# Patient Record
Sex: Male | Born: 1965 | Race: White | Hispanic: No | Marital: Married | Smoking: Never smoker
Health system: Southern US, Community
[De-identification: ages and names within clinical notes are randomized; demographics above are authoritative.]

## PROBLEM LIST (undated history)

## (undated) DIAGNOSIS — J309 Allergic rhinitis, unspecified: Secondary | ICD-10-CM

## (undated) DIAGNOSIS — I499 Cardiac arrhythmia, unspecified: Secondary | ICD-10-CM

## (undated) DIAGNOSIS — E669 Obesity, unspecified: Secondary | ICD-10-CM

## (undated) DIAGNOSIS — I4891 Unspecified atrial fibrillation: Secondary | ICD-10-CM

## (undated) DIAGNOSIS — Z98811 Dental restoration status: Secondary | ICD-10-CM

## (undated) DIAGNOSIS — G039 Meningitis, unspecified: Secondary | ICD-10-CM

## (undated) DIAGNOSIS — J302 Other seasonal allergic rhinitis: Secondary | ICD-10-CM

## (undated) DIAGNOSIS — F419 Anxiety disorder, unspecified: Secondary | ICD-10-CM

## (undated) DIAGNOSIS — G4730 Sleep apnea, unspecified: Secondary | ICD-10-CM

## (undated) DIAGNOSIS — J342 Deviated nasal septum: Secondary | ICD-10-CM

## (undated) DIAGNOSIS — J343 Hypertrophy of nasal turbinates: Secondary | ICD-10-CM

## (undated) DIAGNOSIS — E785 Hyperlipidemia, unspecified: Secondary | ICD-10-CM

## (undated) HISTORY — PX: COLONOSCOPY: SHX174

## (undated) HISTORY — PX: CORONARY ANGIOPLASTY WITH STENT PLACEMENT: SHX49

## (undated) HISTORY — DX: Meningitis, unspecified: G03.9

## (undated) HISTORY — DX: Hyperlipidemia, unspecified: E78.5

---
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
## 2001-01-17 HISTORY — PX: HERNIA REPAIR: SHX51

## 2005-06-26 ENCOUNTER — Ambulatory Visit: Payer: Self-pay | Admitting: Unknown Physician Specialty

## 2009-02-02 ENCOUNTER — Emergency Department (HOSPITAL_COMMUNITY): Admission: EM | Admit: 2009-02-02 | Discharge: 2009-02-02 | Payer: Self-pay | Admitting: Emergency Medicine

## 2010-09-10 LAB — CK: Total CK: 605 U/L — ABNORMAL HIGH (ref 7–232)

## 2010-09-10 LAB — BASIC METABOLIC PANEL
BUN: 17 mg/dL (ref 6–23)
CO2: 29 mEq/L (ref 19–32)
Chloride: 100 mEq/L (ref 96–112)
Glucose, Bld: 132 mg/dL — ABNORMAL HIGH (ref 70–99)
Potassium: 3.5 mEq/L (ref 3.5–5.1)

## 2013-06-05 HISTORY — PX: BACK SURGERY: SHX140

## 2014-03-04 ENCOUNTER — Ambulatory Visit: Payer: Self-pay | Admitting: General Surgery

## 2014-03-04 ENCOUNTER — Encounter: Payer: Self-pay | Admitting: General Surgery

## 2014-03-04 ENCOUNTER — Ambulatory Visit (INDEPENDENT_AMBULATORY_CARE_PROVIDER_SITE_OTHER): Payer: No Typology Code available for payment source | Admitting: General Surgery

## 2014-03-04 VITALS — BP 130/74 | HR 74 | Resp 12 | Ht 73.0 in | Wt 236.0 lb

## 2014-03-04 DIAGNOSIS — K4090 Unilateral inguinal hernia, without obstruction or gangrene, not specified as recurrent: Secondary | ICD-10-CM

## 2014-03-04 NOTE — Patient Instructions (Addendum)

## 2014-03-04 NOTE — Progress Notes (Signed)
Patient ID: Keith Meyer., male   DOB: 11/16/1965, 48 y.o.   MRN: [id number]  Chief Complaint  Patient presents with  . Hernia    HPI Keith Torres. is a 48 y.o. male.  Here today for evaluation of left inguinal hernia. He states he was lifting something heavy (A laminated beam to support the floor joists) at work and he felt it "pull" in the left groin about 3 weeks ago. He states he can push it back in but it does not seem to stay. No change in bowel or bladder function.   HPI  Past Medical History  Diagnosis Date  . Hyperlipidemia   . Meningitis     Past Surgical History  Procedure Laterality Date  . Appendectomy  1974  . Hernia repair Right 01-17-01    Indirect hernia, Prolene hernia system.    Family History  Problem Relation Age of Onset  . Cancer Mother     colon  . Heart disease Father     Social History History  Substance Use Topics  . Smoking status: Never Smoker   . Smokeless tobacco: Never Used  . Alcohol Use: Yes     Comment: occasionally    No Known Allergies  Current Outpatient Prescriptions  Medication Sig Dispense Refill  . naproxen sodium (ANAPROX) 220 MG tablet Take 220 mg by mouth daily.       No current facility-administered medications for this visit.    Review of Systems Review of Systems  Constitutional: Negative.   Respiratory: Negative.   Cardiovascular: Negative.     Blood pressure 130/74, pulse 74, resp. rate 12, height 6\' 1"  (1.854 m), weight 236 lb (107.049 kg).  Physical Exam Physical Exam  Constitutional: He is oriented to person, place, and time. He appears well-developed and well-nourished.  Neck: Neck supple.  Cardiovascular: Normal rate, regular rhythm and normal heart sounds.   Pulmonary/Chest: Effort normal and breath sounds normal.  Abdominal: Soft. Normal appearance. A hernia is present. Hernia confirmed positive in the left inguinal area. Hernia confirmed negative in the right inguinal area.   reducible left inguinal hernia   Genitourinary: Testes normal.  Lymphadenopathy:    He has no cervical adenopathy.  Neurological: He is alert and oriented to person, place, and time.  Skin: Skin is warm and dry.    Data Reviewed None.  Assessment    Symptomatic left inguinal hernia.     Plan    Hernia precautions and incarceration were discussed with the patient. If they develop symptoms of an incarcerated hernia, they were encouraged to seek prompt medical attention.  I have recommended repair of the hernia using mesh on an outpatient basis in the near future. The risk of infection was reviewed. The role of prosthetic mesh to minimize the risk of recurrence was reviewed.  Patient's surgery has been scheduled for 03-23-14 at [facility].  PCP: Keith Torres ([facility])    Robert Bellow 03/06/2014, 8:53 PM

## 2014-03-06 ENCOUNTER — Other Ambulatory Visit: Payer: Self-pay | Admitting: General Surgery

## 2014-03-06 ENCOUNTER — Encounter: Payer: Self-pay | Admitting: General Surgery

## 2014-03-06 DIAGNOSIS — K469 Unspecified abdominal hernia without obstruction or gangrene: Secondary | ICD-10-CM | POA: Insufficient documentation

## 2014-03-06 DIAGNOSIS — K4090 Unilateral inguinal hernia, without obstruction or gangrene, not specified as recurrent: Secondary | ICD-10-CM

## 2014-03-12 ENCOUNTER — Telehealth: Payer: Self-pay | Admitting: *Deleted

## 2014-03-12 NOTE — Telephone Encounter (Signed)
Pt hurt his back yesterday and was wondering can he take a steroid and muscle relaxer this close to his surgery date. He hernia surgery is schedule for 03/23/14

## 2014-03-12 NOTE — Telephone Encounter (Signed)
Yes

## 2014-03-16 ENCOUNTER — Telehealth: Payer: Self-pay

## 2014-03-16 NOTE — Telephone Encounter (Signed)
Patient's wife called and said that the patient has not had a bowel movement since Saturday. He is scheduled for left inguinal hernia repair on 03/23/14. He tried Magnesium Citrate this morning around 10 am and had not had any results yet. He is reporting some sharp pains and is concerned. I spoke with Dr Bary Castilla about this and he said that the Magnesium Citrate will work but needs more time. He said that the patient could use some Milk of Magnesia this evening before bed tonight if he still has not had any results. He is to drink plenty of fluids and was instructed to call the office tomorrow afternoon if no results have been obtained. She expressed understanding and will let the patient know.

## 2014-03-20 ENCOUNTER — Ambulatory Visit: Payer: Self-pay | Admitting: General Practice

## 2014-03-23 ENCOUNTER — Ambulatory Visit: Payer: Self-pay | Admitting: General Surgery

## 2014-03-23 DIAGNOSIS — K4090 Unilateral inguinal hernia, without obstruction or gangrene, not specified as recurrent: Secondary | ICD-10-CM

## 2014-03-23 HISTORY — PX: HERNIA REPAIR: SHX51

## 2014-03-24 ENCOUNTER — Encounter: Payer: Self-pay | Admitting: General Surgery

## 2014-03-30 ENCOUNTER — Encounter: Payer: Self-pay | Admitting: General Surgery

## 2014-03-30 ENCOUNTER — Ambulatory Visit (INDEPENDENT_AMBULATORY_CARE_PROVIDER_SITE_OTHER): Payer: Self-pay | Admitting: General Surgery

## 2014-03-30 VITALS — BP 126/72 | HR 78 | Resp 12 | Ht 73.0 in | Wt 234.0 lb

## 2014-03-30 DIAGNOSIS — K4090 Unilateral inguinal hernia, without obstruction or gangrene, not specified as recurrent: Secondary | ICD-10-CM

## 2014-03-30 NOTE — Patient Instructions (Signed)
Patient advised of lifting restrictions. Patient advised to ease his way back slowly into work. Patient to return as needed. The patient is aware to call back for any questions or concerns.

## 2014-03-30 NOTE — Progress Notes (Signed)
Patient ID: Keith Gudiel., male   DOB: Jan 23, 1966, 48 y.o.   MRN: [id number]  Chief Complaint  Patient presents with  . Routine Post Op    inguinal hernia repair    HPI Keith Torres. is a 48 y.o. male who presents for a post op left inguinal hernia repair. The procedure was performed on 03/23/14 making use of ultra Pro mesh. The patient is doing well. No complaints at this time.  No difficulty with bowel or bladder function.  HPI  Past Medical History  Diagnosis Date  . Hyperlipidemia   . Meningitis     Past Surgical History  Procedure Laterality Date  . Appendectomy  1974  . Hernia repair Right 01-17-01    Indirect hernia, Prolene hernia system.  Marland Kitchen Hernia repair Left 03/23/14    left inguinal hernia repair    Family History  Problem Relation Age of Onset  . Cancer Mother     colon  . Heart disease Father     Social History History  Substance Use Topics  . Smoking status: Never Smoker   . Smokeless tobacco: Never Used  . Alcohol Use: Yes     Comment: occasionally    No Known Allergies  Current Outpatient Prescriptions  Medication Sig Dispense Refill  . naproxen sodium (ANAPROX) 220 MG tablet Take 220 mg by mouth daily.       No current facility-administered medications for this visit.    Review of Systems Review of Systems  Constitutional: Negative.   Respiratory: Negative.   Cardiovascular: Negative.   Gastrointestinal: Negative.     Blood pressure 126/72, pulse 78, resp. rate 12, height 6\' 1"  (1.854 m), weight 234 lb (106.142 kg).  Physical Exam Physical Exam  Constitutional: He is oriented to person, place, and time. He appears well-developed and well-nourished.  Abdominal: Soft. Normal appearance and bowel sounds are normal. There is no hepatosplenomegaly. There is no tenderness. No hernia.  Neurological: He is alert and oriented to person, place, and time.  Skin: Skin is warm and dry.  No cord thickening or tenderness. Normal  testicle. No impulse with coughing or straining.  Data Reviewed None.  Assessment    Doing well status post left inguinal hernia repair.    Plan    Care with lifting was reviewed. Proper lifting technique demonstrated. 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  Follow-up here will be on an as-needed basis.    PCP/Ref MD: Anastasia Pall 03/30/2014, 11:09 AM

## 2014-05-14 ENCOUNTER — Encounter: Payer: Self-pay | Admitting: General Surgery

## 2014-07-06 DIAGNOSIS — E782 Mixed hyperlipidemia: Secondary | ICD-10-CM | POA: Insufficient documentation

## 2014-07-06 DIAGNOSIS — R0681 Apnea, not elsewhere classified: Secondary | ICD-10-CM | POA: Insufficient documentation

## 2014-07-09 DIAGNOSIS — I340 Nonrheumatic mitral (valve) insufficiency: Secondary | ICD-10-CM | POA: Insufficient documentation

## 2014-07-09 DIAGNOSIS — I480 Paroxysmal atrial fibrillation: Secondary | ICD-10-CM | POA: Insufficient documentation

## 2014-07-21 ENCOUNTER — Ambulatory Visit: Payer: Self-pay | Admitting: Internal Medicine

## 2014-08-12 ENCOUNTER — Ambulatory Visit: Payer: Self-pay | Admitting: Internal Medicine

## 2014-09-26 NOTE — Op Note (Signed)
PATIENT NAME:  QUANDARIUS, NILL MR#:  [id number] DATE OF BIRTH:  20-Feb-1966  DATE OF PROCEDURE:  03/23/2014  PREOPERATIVE DIAGNOSIS: Symptomatic left inguinal hernia.   POSTOPERATIVE DIAGNOSIS:  Symptomatic left inguinal hernia.   OPERATIVE PROCEDURE: Left inguinal hernia repair with UltraPro mesh.   SURGEON: Hervey Ard, MD   ANESTHESIA: General by LMA under Dr. Ronelle Nigh, Marcaine 0.5% with 1:200,000 units :epinephrine, 30 mL local infiltration, Toradol 30 mg.   ESTIMATED BLOOD LOSS: Less than 5 mL.   CLINICAL NOTE:  This 49 year old male had undergone a right inguinal hernia repair in 2002. He has recently developed symptomatic left inguinal hernia and was admitted for elective repair. He received Kefzol prior to the procedure. Hair was removed with clippers.   OPERATIVE NOTE:  With the patient under adequate general anesthesia, the abdomen was prepped with ChloraPrep and draped.  A 5 cm incision along the anticipated course of the inguinal canal was completed after instillation of Marcaine as a field block for postoperative analgesia.  The skin was incised sharply and the remaining dissection completed with electrocautery.  The external oblique was opened in the direction of its fibers. The cord was mobilized and the iliohypogastric nerve identified and protected. There was an indirect sac and this was dissected free from adjacent cord structures. Pneumonic chronic adhesions were noted near the internal ring. The hernia sac was freed circumferentially and returned to the preperitoneal space. The large Ultrapro mesh was smoothed into position and the preperitoneal space and the external component was smoothed along the floor of the inguinal canal.  This was anchored to the pubic tubercle with 0 Surgilon suture. The superior and medial borders were anchored to the transverse abdominis aponeurosis and the inferior border to the inguinal ligament.  Care was taken to protect the genitofemoral  nerve. The external oblique was closed after returning the cord and nerves to their bed.  Toradol was instilled for postoperative analgesia. The external oblique was closed with a running 2-0 Vicryl. Scarpa's fascia was closed with a running 3-0 Vicryl, and the skin closed with a running 4-0 Vicryl subcuticular suture. Benzoin, Steri-Strips, Telfa and Tegaderm dressing was applied.   The patient tolerated the procedure well and was taken to the recovery room in stable condition.      ____________________________ Robert Bellow, MD jwb:DT D: 03/23/2014 14:34:28 ET T: 03/23/2014 17:18:27 ET JOB#: [id number]  cc: Robert Bellow, MD, <Dictator> Milinda Pointer. Jacqualine Code, MD  Sergey Ishler Amedeo Kinsman MD ELECTRONICALLY SIGNED 03/25/2014 21:32

## 2014-11-04 DIAGNOSIS — G4733 Obstructive sleep apnea (adult) (pediatric): Secondary | ICD-10-CM | POA: Insufficient documentation

## 2015-03-16 ENCOUNTER — Other Ambulatory Visit: Payer: Self-pay

## 2015-03-16 ENCOUNTER — Ambulatory Visit: Payer: Self-pay | Admitting: Physician Assistant

## 2015-03-16 ENCOUNTER — Encounter: Payer: Self-pay | Admitting: Physician Assistant

## 2015-03-16 ENCOUNTER — Other Ambulatory Visit: Payer: Self-pay | Admitting: Family Medicine

## 2015-03-16 VITALS — BP 110/70 | HR 174 | Temp 97.8°F

## 2015-03-16 DIAGNOSIS — I4819 Other persistent atrial fibrillation: Secondary | ICD-10-CM

## 2015-03-16 DIAGNOSIS — J302 Other seasonal allergic rhinitis: Secondary | ICD-10-CM | POA: Insufficient documentation

## 2015-03-16 DIAGNOSIS — F419 Anxiety disorder, unspecified: Secondary | ICD-10-CM | POA: Insufficient documentation

## 2015-03-16 DIAGNOSIS — E669 Obesity, unspecified: Secondary | ICD-10-CM | POA: Insufficient documentation

## 2015-03-16 NOTE — Progress Notes (Signed)
S: c/o having ?afib, hx of same, is on metoprolol 25mg  bid, cardioligist is dr Nehemiah Massed, denies cp, is having some sob intermittently, saw ENT on Thur 03/11/2015 and was told to see his cardiologist, has not seen them yet, went on trip this weekend, now having some sob; concerned, thinks its ?due to cpap not working  O: wnwd wm nad pulse 174, bp normal, lungs c t a, cv irregular rhythm ekg shows atrial fib  A: atrial fib  P: pt to see dr Nehemiah Massed asap, our office is calling to make appointment and talk with nursing staff, unable to get appointment until Mon 10/17. Pt instructed to increase metoprolol to 50mg  bid and go to ER asap if sx are worsening

## 2015-03-19 LAB — ALLERGEN PANEL (27) + IGE
Aspergillus Fumigatus IgE: <0.1 kU/L
Bermuda Grass IgE: <0.1 kU/L
Cedar, Mountain IgE: <0.1 kU/L
Cladosporium Herbarum IgE: <0.1 kU/L
Cockroach, American IgE: <0.1 kU/L
Common Silver Birch IgE: <0.1 kU/L
Dog Dander IgE: <0.1 kU/L
IGE (IMMUNOGLOBULIN E), SERUM: 12 [IU]/mL (ref 0–100)
Kentucky Bluegrass IgE: <0.1 kU/L
Mucor Racemosus IgE: <0.1 kU/L
Oak, White IgE: <0.1 kU/L
Pigweed, Rough IgE: <0.1 kU/L
Ragweed, Short IgE: <0.1 kU/L
Timothy Grass IgE: <0.1 kU/L
White Mulberry IgE: <0.1 kU/L

## 2015-03-23 ENCOUNTER — Telehealth: Payer: Self-pay

## 2015-03-23 NOTE — Telephone Encounter (Signed)
This is a patient of Ashok Cordia, PA-C from [employer].

## 2015-05-18 NOTE — Discharge Instructions (Signed)
[facility] °[facility] °ENDOSCOPIC SINUS SURGERY °[facility] ° °What is Functional Endoscopic Sinus Surgery? ° The Surgery involves making the natural openings of the sinuses larger by removing the bony partitions that separate the sinuses from the nasal cavity.  The natural sinus lining is preserved as much as possible to allow the sinuses to resume normal function after the surgery.  In some patients nasal polyps (excessively swollen lining of the sinuses) may be removed to relieve obstruction of the sinus openings.  The surgery is performed through the nose using lighted scopes, which eliminates the need for incisions on the face.  A septoplasty is a different procedure which is sometimes performed with sinus surgery.  It involves straightening the boy partition that separates the two sides of your nose.  A crooked or deviated septum may need repair if is obstructing the sinuses or nasal airflow.  Turbinate reduction is also often performed during sinus surgery.  The turbinates are bony proturberances from the side walls of the nose which swell and can obstruct the nose in patients with sinus and allergy problems.  Their size can be surgically reduced to help relieve nasal obstruction. ° °What Can Sinus Surgery Do For Me? ° Sinus surgery can reduce the frequency of sinus infections requiring antibiotic treatment.  This can provide improvement in nasal congestion, post-nasal drainage, facial pressure and nasal obstruction.  Surgery will NOT prevent you from ever having an infection again, so it usually only for patients who get infections 4 or more times yearly requiring antibiotics, or for infections that do not clear with antibiotics.  It will not cure nasal allergies, so patients with allergies may still require medication to treat their allergies after surgery. Surgery may improve headaches related to sinusitis, however, some people will continue to  require medication to control sinus headaches related to allergies.  Surgery will do nothing for other forms of headache (migraine, tension or cluster). ° °What Are the Risks of Endoscopic Sinus Surgery? ° Current techniques allow surgery to be performed safely with little risk, however, there are rare complications that patients should be aware of.  Because the sinuses are located around the eyes, there is risk of eye injury, including blindness, though again, this would be quite rare. This is usually a result of bleeding behind the eye during surgery, which puts the vision oat risk, though there are treatments to protect the vision and prevent permanent disrupted by surgery causing a leak of the spinal fluid that surrounds the brain.  More serious complications would include bleeding inside the brain cavity or damage to the brain.  Again, all of these complications are uncommon, and spinal fluid leaks can be safely managed surgically if they occur.  The most common complication of sinus surgery is bleeding from the nose, which may require packing or cauterization of the nose.  Continued sinus have polyps may experience recurrence of the polyps requiring revision surgery.  Alterations of sense of smell or injury to the tear ducts are also rare complications.  ° °What is the Surgery Like, and what is the Recovery? ° The Surgery usually takes a couple of hours to perform, and is usually performed under a general anesthetic (completely asleep).  Patients are usually discharged home after a couple of hours.  Sometimes during surgery it is necessary to pack the nose to control bleeding, and the packing is left in place for 24 - 48 hours, and removed by your surgeon.    If a septoplasty was performed during the procedure, there is often a splint placed which must be removed after 5-7 days.   °Discomfort: Pain is usually mild to moderate, and can be controlled by prescription pain medication or acetaminophen (Tylenol).   Aspirin, Ibuprofen (Advil, Motrin), or Naprosyn (Aleve) should be avoided, as they can cause increased bleeding.  Most patients feel sinus pressure like they have a bad head cold for several days.  Sleeping with your head elevated can help reduce swelling and facial pressure, as can ice packs over the face.  A humidifier may be helpful to keep the mucous and blood from drying in the nose.  ° °Diet: There are no specific diet restrictions, however, you should generally start with clear liquids and a light diet of bland foods because the anesthetic can cause some nausea.  Advance your diet depending on how your stomach feels.  Taking your pain medication with food will often help reduce stomach upset which pain medications can cause. ° °Nasal Saline Irrigation: It is important to remove blood clots and dried mucous from the nose as it is healing.  This is done by having you irrigate the nose at least 3 - 4 times daily with a salt water solution.  We recommend using NeilMed Sinus Rinse (available at the drug store).  Fill the squeeze bottle with the solution, bend over a sink, and insert the tip of the squeeze bottle into the nose ½ of an inch.  Point the tip of the squeeze bottle towards the inside corner of the eye on the same side your irrigating.  Squeeze the bottle and gently irrigate the nose.  If you bend forward as you do this, most of the fluid will flow back out of the nose, instead of down your throat.   The solution should be warm, near body temperature, when you irrigate.   Each time you irrigate, you should use a full squeeze bottle.  ° °Note that if you are instructed to use Nasal Steroid Sprays at any time after your surgery, irrigate with saline BEFORE using the steroid spray, so you do not wash it all out of the nose. °Another product, Nasal Saline Gel (such as AYR Nasal Saline Gel) can be applied in each nostril 3 - 4 times daily to moisture the nose and reduce scabbing or crusting. ° °Bleeding:   Bloody drainage from the nose can be expected for several days, and patients are instructed to irrigate their nose frequently with salt water to help remove mucous and blood clots.  The drainage may be dark red or brown, though some fresh blood may be seen intermittently, especially after irrigation.  Do not blow you nose, as bleeding may occur. If you must sneeze, keep your mouth open to allow air to escape through your mouth. ° °If heavy bleeding occurs: Irrigate the nose with saline to rinse out clots, then spray the nose 3 - 4 times with Afrin Nasal Decongestant Spray.  The spray will constrict the blood vessels to slow bleeding.  Pinch the lower half of your nose shut to apply pressure, and lay down with your head elevated.  Ice packs over the nose may help as well. If bleeding persists despite these measures, you should notify your doctor.  Do not use the Afrin routinely to control nasal congestion after surgery, as it can result in worsening congestion and may affect healing.  ° ° ° °Activity: Return to work varies among patients. Most patients will be   out of work at least 5 - 7 days to recover.  Patient may return to work after they are off of narcotic pain medication, and feeling well enough to perform the functions of their job.  Patients must avoid heavy lifting (over 10 pounds) or strenuous physical for 2 weeks after surgery, so your employer may need to assign you to light duty, or keep you out of work longer if light duty is not possible.  NOTE: you should not drive, operate dangerous machinery, do any mentally demanding tasks or make any important legal or financial decisions while on narcotic pain medication and recovering from the general anesthetic.  °  °Call Your Doctor Immediately if You Have Any of the Following: °1. Bleeding that you cannot control with the above measures °2. Loss of vision, double vision, bulging of the eye or black eyes. °3. Fever over 101 degrees °4. Neck stiffness with  severe headache, fever, nausea and change in mental state. °You are always encourage to call anytime with concerns, however, please call with requests for pain medication refills during office hours. ° °Office Endoscopy: During follow-up visits your doctor will remove any packing or splints that may have been placed and evaluate and clean your sinuses endoscopically.  Topical anesthetic will be used to make this as comfortable as possible, though you may want to take your pain medication prior to the visit.  How often this will need to be done varies from patient to patient.  After complete recovery from the surgery, you may need follow-up endoscopy from time to time, particularly if there is concern of recurrent infection or nasal polyps. ° °General Anesthesia, Adult, Care After °Refer to this sheet in the next few weeks. These instructions provide you with information on caring for yourself after your procedure. Your health care provider may also give you more specific instructions. Your treatment has been planned according to current medical practices, but problems sometimes occur. Call your health care provider if you have any problems or questions after your procedure. °WHAT TO EXPECT AFTER THE PROCEDURE °After the procedure, it is typical to experience: °· Sleepiness. °· Nausea and vomiting. °HOME CARE INSTRUCTIONS °· For the first 24 hours after general anesthesia: °¨ Have a responsible person with you. °¨ Do not drive a car. If you are alone, do not take public transportation. °¨ Do not drink alcohol. °¨ Do not take medicine that has not been prescribed by your health care provider. °¨ Do not sign important papers or make important decisions. °¨ You may resume a normal diet and activities as directed by your health care provider. °· Change bandages (dressings) as directed. °· If you have questions or problems that seem related to general anesthesia, call the hospital and ask for the anesthetist or  anesthesiologist on call. °SEEK MEDICAL CARE IF: °· You have nausea and vomiting that continue the day after anesthesia. °· You develop a rash. °SEEK IMMEDIATE MEDICAL CARE IF:  °· You have difficulty breathing. °· You have chest pain. °· You have any allergic problems. °  °This information is not intended to replace advice given to you by your health care provider. Make sure you discuss any questions you have with your health care provider. °  °Document Released: 08/28/2000 Document Revised: 06/12/2014 Document Reviewed: 09/20/2011 °Elsevier Interactive Patient Education ©2016 [employer] ° °

## 2015-05-21 ENCOUNTER — Encounter
Admission: RE | Disposition: A | Discharge status: Home / Self Care | Payer: Self-pay | Source: Ambulatory Visit | Attending: Unknown Physician Specialty

## 2015-05-21 ENCOUNTER — Ambulatory Visit
Admission: RE | Admit: 2015-05-21 | Discharge: 2015-05-21 | Disposition: A | Discharge status: Home / Self Care | Payer: PRIVATE HEALTH INSURANCE | Source: Ambulatory Visit | Attending: Unknown Physician Specialty | Admitting: Unknown Physician Specialty

## 2015-05-21 ENCOUNTER — Ambulatory Visit: Payer: PRIVATE HEALTH INSURANCE | Admitting: Anesthesiology

## 2015-05-21 DIAGNOSIS — J342 Deviated nasal septum: Secondary | ICD-10-CM | POA: Diagnosis not present

## 2015-05-21 DIAGNOSIS — J3489 Other specified disorders of nose and nasal sinuses: Secondary | ICD-10-CM | POA: Diagnosis not present

## 2015-05-21 DIAGNOSIS — J343 Hypertrophy of nasal turbinates: Secondary | ICD-10-CM | POA: Insufficient documentation

## 2015-05-21 HISTORY — DX: Sleep apnea, unspecified: G47.30

## 2015-05-21 HISTORY — DX: Dental restoration status: Z98.811

## 2015-05-21 HISTORY — DX: Deviated nasal septum: J34.2

## 2015-05-21 HISTORY — DX: Allergic rhinitis, unspecified: J30.9

## 2015-05-21 HISTORY — DX: Cardiac arrhythmia, unspecified: I49.9

## 2015-05-21 HISTORY — PX: NASAL TURBINATE REDUCTION: SHX2072

## 2015-05-21 HISTORY — DX: Anxiety disorder, unspecified: F41.9

## 2015-05-21 HISTORY — DX: Hypertrophy of nasal turbinates: J34.3

## 2015-05-21 HISTORY — PX: SEPTOPLASTY: SHX2393

## 2015-05-21 SURGERY — SEPTOPLASTY, NOSE
Anesthesia: General | Wound class: Clean Contaminated

## 2015-05-21 MED ORDER — MIDAZOLAM HCL 5 MG/5ML IJ SOLN
INTRAMUSCULAR | Status: DC | PRN
  Administered 2015-05-21: 2 mg via INTRAVENOUS

## 2015-05-21 MED ORDER — OXYMETAZOLINE HCL 0.05 % NA SOLN
6.0000 | Freq: Once | NASAL | Status: AC
  Administered 2015-05-21: 6 via NASAL

## 2015-05-21 MED ORDER — LIDOCAINE HCL 4 % MT SOLN
OROMUCOSAL | Status: DC | PRN
  Administered 2015-05-21: 4 mL via TOPICAL

## 2015-05-21 MED ORDER — SCOPOLAMINE 1 MG/3DAYS TD PT72
1.0000 | MEDICATED_PATCH | Freq: Once | TRANSDERMAL | Status: DC
  Administered 2015-05-21: 1.5 mg via TRANSDERMAL

## 2015-05-21 MED ORDER — LIDOCAINE-EPINEPHRINE 1 %-1:100000 IJ SOLN
INTRAMUSCULAR | Status: DC | PRN
  Administered 2015-05-21: 11 mL

## 2015-05-21 MED ORDER — EPHEDRINE SULFATE 50 MG/ML IJ SOLN
INTRAMUSCULAR | Status: DC | PRN
  Administered 2015-05-21 (×2): 5 mg via INTRAVENOUS
  Administered 2015-05-21: 10 mg via INTRAVENOUS

## 2015-05-21 MED ORDER — GLYCOPYRROLATE 0.2 MG/ML IJ SOLN
INTRAMUSCULAR | Status: DC | PRN
  Administered 2015-05-21: .1 mg via INTRAVENOUS

## 2015-05-21 MED ORDER — DEXAMETHASONE SODIUM PHOSPHATE 4 MG/ML IJ SOLN
INTRAMUSCULAR | Status: DC | PRN
  Administered 2015-05-21: 10 mg via INTRAVENOUS

## 2015-05-21 MED ORDER — FENTANYL CITRATE (PF) 100 MCG/2ML IJ SOLN
INTRAMUSCULAR | Status: DC | PRN
  Administered 2015-05-21: 100 ug via INTRAVENOUS

## 2015-05-21 MED ORDER — ONDANSETRON HCL 4 MG/2ML IJ SOLN: 4.0000 mg | Freq: Once | INTRAMUSCULAR | Status: DC | PRN

## 2015-05-21 MED ORDER — SUCCINYLCHOLINE CHLORIDE 20 MG/ML IJ SOLN
INTRAMUSCULAR | Status: DC | PRN
  Administered 2015-05-21: 100 mg via INTRAVENOUS

## 2015-05-21 MED ORDER — ONDANSETRON HCL 4 MG/2ML IJ SOLN
INTRAMUSCULAR | Status: DC | PRN
  Administered 2015-05-21: 4 mg via INTRAVENOUS

## 2015-05-21 MED ORDER — PROPOFOL 10 MG/ML IV BOLUS
INTRAVENOUS | Status: DC | PRN
  Administered 2015-05-21: 50 mg via INTRAVENOUS
  Administered 2015-05-21: 150 mg via INTRAVENOUS

## 2015-05-21 MED ORDER — ACETAMINOPHEN 10 MG/ML IV SOLN
1000.0000 mg | Freq: Once | INTRAVENOUS | Status: AC
  Administered 2015-05-21: 1000 mg via INTRAVENOUS

## 2015-05-21 MED ORDER — LACTATED RINGERS IV SOLN
INTRAVENOUS | Status: DC
  Administered 2015-05-21: 09:00:00 via INTRAVENOUS

## 2015-05-21 MED ORDER — PHENYLEPHRINE HCL 0.5 % NA SOLN
NASAL | Status: DC | PRN
  Administered 2015-05-21: 30 mL via TOPICAL

## 2015-05-21 MED ORDER — SULFAMETHOXAZOLE-TRIMETHOPRIM 400-80 MG PO TABS: 1.0000 | ORAL_TABLET | Freq: Two times a day (BID) | ORAL | Status: DC

## 2015-05-21 MED ORDER — FENTANYL CITRATE (PF) 100 MCG/2ML IJ SOLN
25.0000 ug | INTRAMUSCULAR | Status: DC | PRN
  Administered 2015-05-21: 50 ug via INTRAVENOUS

## 2015-05-21 MED ORDER — HYDROCODONE-ACETAMINOPHEN 5-300 MG PO TABS: 1.0000 | ORAL_TABLET | ORAL | Status: DC | PRN

## 2015-05-21 MED ORDER — LIDOCAINE HCL (CARDIAC) 20 MG/ML IV SOLN
INTRAVENOUS | Status: DC | PRN
  Administered 2015-05-21: 40 mg via INTRAVENOUS

## 2015-05-21 SURGICAL SUPPLY — 28 items
COAG SUCT 10F 3.5MM HAND CTRL (MISCELLANEOUS) ×4 IMPLANT
DRAPE HEAD BAR (DRAPES) ×4 IMPLANT
DRESSING NASL FOAM PST OP SINU (MISCELLANEOUS) ×4 IMPLANT
DRSG NASAL FOAM POST OP SINU (MISCELLANEOUS) ×8
GLOVE BIO SURGEON STRL SZ7.5 (GLOVE) ×12 IMPLANT
HANDLE YANKAUER SUCT BULB TIP (MISCELLANEOUS) ×4 IMPLANT
KIT ROOM TURNOVER OR (KITS) ×4 IMPLANT
NEEDLE HYPO 25GX1X1/2 BEV (NEEDLE) ×4 IMPLANT
NS IRRIG 500ML POUR BTL (IV SOLUTION) IMPLANT
PACK DRAPE NASAL/ENT (PACKS) ×4 IMPLANT
PAD GROUND ADULT SPLIT (MISCELLANEOUS) ×4 IMPLANT
SOL ANTI-FOG 6CC FOG-OUT (MISCELLANEOUS) ×2 IMPLANT
SOL FOG-OUT ANTI-FOG 6CC (MISCELLANEOUS) ×2
SPLINT NASAL SEPTAL BLV .25 LG (MISCELLANEOUS) ×4 IMPLANT
SPLINT NASAL SEPTAL BLV .50 ST (MISCELLANEOUS) IMPLANT
SPONGE NEURO XRAY DETECT 1X3 (DISPOSABLE) ×4 IMPLANT
STRAP BODY AND KNEE 60X3 (MISCELLANEOUS) ×4 IMPLANT
SUT CHROMIC 3-0 (SUTURE) ×2
SUT CHROMIC 3-0 KS 27XMFL CR (SUTURE) ×2
SUT CHROMIC 5-0 (SUTURE)
SUT CHROMIC 5-0 P2 18XMFL CR (SUTURE)
SUT ETHILON 3-0 KS 30 BLK (SUTURE) ×4 IMPLANT
SUT PLAIN GUT 4-0 (SUTURE) IMPLANT
SUTURE CHRMC 3-0 KS 27XMFL CR (SUTURE) ×2 IMPLANT
SUTURE CHRMC 5-0 P2 18XMF CR (SUTURE) IMPLANT
SYRINGE 10CC LL (SYRINGE) ×4 IMPLANT
TOWEL OR 17X26 4PK STRL BLUE (TOWEL DISPOSABLE) ×4 IMPLANT
WATER STERILE IRR 500ML POUR (IV SOLUTION) ×4 IMPLANT

## 2015-05-21 NOTE — H&P (Signed)
  H+P  Reviewed and will be scanned in later. No changes noted. 

## 2015-05-21 NOTE — Anesthesia Preprocedure Evaluation (Signed)
Anesthesia Evaluation  Patient identified by MRN, date of birth, ID band  Reviewed: Allergy & Precautions, H&P , NPO status , Patient's Chart, lab work & pertinent test results  Airway Mallampati: II  TM Distance: >3 FB Neck ROM: full    Dental no notable dental hx.    Pulmonary shortness of breath, sleep apnea ,    Pulmonary exam normal        Cardiovascular + dysrhythmias Atrial Fibrillation  Rhythm:regular Rate:Normal     Neuro/Psych    GI/Hepatic   Endo/Other    Renal/GU      Musculoskeletal   Abdominal   Peds  Hematology   Anesthesia Other Findings   Reproductive/Obstetrics                             Anesthesia Physical Anesthesia Plan  ASA: II  Anesthesia Plan: General ETT   Post-op Pain Management:    Induction:   Airway Management Planned:   Additional Equipment:   Intra-op Plan:   Post-operative Plan:   Informed Consent: I have reviewed the patients History and Physical, chart, labs and discussed the procedure including the risks, benefits and alternatives for the proposed anesthesia with the patient or authorized representative who has indicated his/her understanding and acceptance.     Plan Discussed with: CRNA  Anesthesia Plan Comments:         Anesthesia Quick Evaluation

## 2015-05-21 NOTE — Anesthesia Procedure Notes (Signed)
Procedure Name: Intubation Date/Time: 05/21/2015 10:13 AM Performed by: Mayme Genta Pre-anesthesia Checklist: Patient identified, Emergency Drugs available, Suction available, Patient being monitored and Timeout performed Patient Re-evaluated:Patient Re-evaluated prior to inductionOxygen Delivery Method: Circle system utilized Preoxygenation: Pre-oxygenation with 100% oxygen Intubation Type: IV induction Ventilation: Mask ventilation without difficulty Laryngoscope Size: Miller and 3 Grade View: Grade I Tube type: Oral Rae Tube size: 7.5 mm Number of attempts: 1 Placement Confirmation: ETT inserted through vocal cords under direct vision,  positive ETCO2 and breath sounds checked- equal and bilateral Tube secured with: Tape Dental Injury: Teeth and Oropharynx as per pre-operative assessment

## 2015-05-21 NOTE — Transfer of Care (Signed)
Immediate Anesthesia Transfer of Care Note  Patient: Keith Torres.  Procedure(s) Performed: Procedure(s) with comments: SEPTOPLASTY (N/A) - C-PAP TURBINATE REDUCTION/SUBMUCOSAL RESECTION (Bilateral)  Patient Location: PACU  Anesthesia Type: General ETT  Level of Consciousness: awake, alert  and patient cooperative  Airway and Oxygen Therapy: Patient Spontanous Breathing and Patient connected to supplemental oxygen  Post-op Assessment: Post-op Vital signs reviewed, Patient's Cardiovascular Status Stable, Respiratory Function Stable, Patent Airway and No signs of Nausea or vomiting  Post-op Vital Signs: Reviewed and stable  Complications: No apparent anesthesia complications

## 2015-05-21 NOTE — Op Note (Signed)
PREOPERATIVE DIAGNOSIS:  Chronic nasal obstruction.  POSTOPERATIVE DIAGNOSIS:  Chronic nasal obstruction.  SURGEON:  Roena Malady, M.D.  NAME OF PROCEDURE:  1. Nasal septoplasty. 2. Submucous resection of inferior turbinates.  OPERATIVE FINDINGS:  Severe nasal septal deformity, hypertrophy of the inferior turbinates.   DESCRIPTION OF THE PROCEDURE:  Keith Torres. was identified in the holding area and taken to the operating room and placed in the supine position.  After general endotracheal anesthesia was induced, the table was turned 45 degrees and the patient was placed in a semi-Fowler position.  The nose was then topically anesthetized with Lidocaine, cotton pledgets were placed within each nostril. After approximately 5 minutes, this was removed at which time a local anesthetic of 1% Lidocaine 1:100,000 units of Epinephrine was used to inject the inferior turbinates in the nasal septum. A total of 12 ml was used. Examination of the nose showed a severe left nasal septal deformity and tremendous hypertrophied inferior turbinate.  Beginning on the right hand side a hemitransfixion incision was then created on the leading edge of the septum on the right.  A subperichondrial plane was elevated posteriorly on the left and taken back to the perpendicular plate of the ethmoid where subperiosteal plane was elevated posteriorly on the left. A large septal spur was identified on the left hand side impacting on the inferior turbinate.  An inferior rim of cartilage was removed anteriorly with care taken to leave an anterior strut to prevent nasal collapse. With this strut removed the perpendicular plate of the ethmoid was separated from the quadrangular cartilage. The large septal spur was removed.  The septum was then replaced in the midline. Reinspection through each nostril showed excellent reduction of the septal deformity. A left posterior inferior fenestration was then created to allow  hematoma drainage.  With the septoplasty completed, beginning on the left-hand side, a 15 blade was used to incise along the inferior edge of the inferior turbinate. A superior laterally based flap was then elevated. The underlying conchal bone of mucosa was excised using Knight scissors. The flap was then laid back over the turbinate stump and cauterized using suction cautery. In a similar fashion the submucous resection was performed on the right.  With the submucous resection completed bilaterally and no active bleeding, the hemitransfixion incision was then closed using two interrupted 3-0 chromic sutures.  Plastic nasal septal splints were placed within each nostril and affixed to the septum using a 3-0 nylon suture. Stammberger was then used beneath each inferior turbinate for hemostasis.    The patient tolerated the procedure well, was returned to anesthesia, extubated in the operating room, and taken to the recovery room in stable condition.    CULTURES:  None.  SPECIMENS:  None.  ESTIMATED BLOOD LOSS:  25 cc.  [name] T  05/21/2015  10:45 AM

## 2015-05-21 NOTE — Anesthesia Postprocedure Evaluation (Signed)
Anesthesia Post Note  Patient: Keith Torres.  Procedure(s) Performed: Procedure(s) (LRB): SEPTOPLASTY (N/A) TURBINATE REDUCTION/SUBMUCOSAL RESECTION (Bilateral)  Patient location during evaluation: PACU Anesthesia Type: General Level of consciousness: awake and alert and oriented Pain management: satisfactory to patient Vital Signs Assessment: post-procedure vital signs reviewed and stable Respiratory status: spontaneous breathing, nonlabored ventilation and respiratory function stable Cardiovascular status: blood pressure returned to baseline and stable Postop Assessment: Adequate PO intake and No signs of nausea or vomiting Anesthetic complications: no    Raliegh Ip

## 2015-05-24 ENCOUNTER — Encounter: Payer: Self-pay | Admitting: Unknown Physician Specialty

## 2015-10-07 ENCOUNTER — Encounter: Payer: Self-pay | Admitting: *Deleted

## 2015-10-08 ENCOUNTER — Ambulatory Visit: Payer: Managed Care, Other (non HMO) | Admitting: Anesthesiology

## 2015-10-08 ENCOUNTER — Encounter
Admission: RE | Disposition: A | Discharge status: Home Health | Payer: Self-pay | Source: Ambulatory Visit | Attending: Unknown Physician Specialty

## 2015-10-08 ENCOUNTER — Ambulatory Visit
Admission: RE | Admit: 2015-10-08 | Discharge: 2015-10-08 | Disposition: A | Discharge status: Home Health | Payer: Managed Care, Other (non HMO) | Source: Ambulatory Visit | Attending: Unknown Physician Specialty | Admitting: Unknown Physician Specialty

## 2015-10-08 ENCOUNTER — Encounter: Payer: Self-pay | Admitting: *Deleted

## 2015-10-08 DIAGNOSIS — I4891 Unspecified atrial fibrillation: Secondary | ICD-10-CM | POA: Diagnosis not present

## 2015-10-08 DIAGNOSIS — Z7982 Long term (current) use of aspirin: Secondary | ICD-10-CM | POA: Diagnosis not present

## 2015-10-08 DIAGNOSIS — Z800 Family history of malignant neoplasm of digestive organs: Secondary | ICD-10-CM | POA: Diagnosis not present

## 2015-10-08 DIAGNOSIS — F419 Anxiety disorder, unspecified: Secondary | ICD-10-CM | POA: Diagnosis not present

## 2015-10-08 DIAGNOSIS — D127 Benign neoplasm of rectosigmoid junction: Secondary | ICD-10-CM | POA: Diagnosis not present

## 2015-10-08 DIAGNOSIS — Z79899 Other long term (current) drug therapy: Secondary | ICD-10-CM | POA: Diagnosis not present

## 2015-10-08 DIAGNOSIS — Z6830 Body mass index (BMI) 30.0-30.9, adult: Secondary | ICD-10-CM | POA: Diagnosis not present

## 2015-10-08 DIAGNOSIS — E669 Obesity, unspecified: Secondary | ICD-10-CM | POA: Diagnosis not present

## 2015-10-08 DIAGNOSIS — Z1211 Encounter for screening for malignant neoplasm of colon: Secondary | ICD-10-CM | POA: Insufficient documentation

## 2015-10-08 DIAGNOSIS — K640 First degree hemorrhoids: Secondary | ICD-10-CM | POA: Insufficient documentation

## 2015-10-08 DIAGNOSIS — G4730 Sleep apnea, unspecified: Secondary | ICD-10-CM | POA: Insufficient documentation

## 2015-10-08 DIAGNOSIS — E785 Hyperlipidemia, unspecified: Secondary | ICD-10-CM | POA: Diagnosis not present

## 2015-10-08 DIAGNOSIS — Z7901 Long term (current) use of anticoagulants: Secondary | ICD-10-CM | POA: Insufficient documentation

## 2015-10-08 DIAGNOSIS — D120 Benign neoplasm of cecum: Secondary | ICD-10-CM | POA: Diagnosis not present

## 2015-10-08 HISTORY — DX: Other seasonal allergic rhinitis: J30.2

## 2015-10-08 HISTORY — PX: COLONOSCOPY WITH PROPOFOL: SHX5780

## 2015-10-08 HISTORY — DX: Obesity, unspecified: E66.9

## 2015-10-08 SURGERY — COLONOSCOPY WITH PROPOFOL
Anesthesia: General

## 2015-10-08 MED ORDER — SODIUM CHLORIDE 0.9 % IV SOLN
INTRAVENOUS | Status: DC
  Administered 2015-10-08: 1000 mL via INTRAVENOUS

## 2015-10-08 MED ORDER — PROPOFOL 10 MG/ML IV BOLUS
INTRAVENOUS | Status: DC | PRN
  Administered 2015-10-08: 50 mg via INTRAVENOUS

## 2015-10-08 MED ORDER — PROPOFOL 500 MG/50ML IV EMUL
INTRAVENOUS | Status: DC | PRN
  Administered 2015-10-08: 130 ug/kg/min via INTRAVENOUS

## 2015-10-08 MED ORDER — MIDAZOLAM HCL 2 MG/2ML IJ SOLN
INTRAMUSCULAR | Status: DC | PRN
  Administered 2015-10-08: 2 mg via INTRAVENOUS

## 2015-10-08 MED ORDER — PHENYLEPHRINE HCL 10 MG/ML IJ SOLN
INTRAMUSCULAR | Status: DC | PRN
  Administered 2015-10-08 (×2): 100 ug via INTRAVENOUS

## 2015-10-08 MED ORDER — LIDOCAINE HCL (CARDIAC) 20 MG/ML IV SOLN
INTRAVENOUS | Status: DC | PRN
  Administered 2015-10-08: 60 mg via INTRAVENOUS

## 2015-10-08 MED ORDER — SODIUM CHLORIDE 0.9 % IV SOLN: INTRAVENOUS | Status: DC

## 2015-10-08 NOTE — Transfer of Care (Signed)
Immediate Anesthesia Transfer of Care Note  Patient: Keith Torres.  Procedure(s) Performed: Procedure(s): COLONOSCOPY WITH PROPOFOL (N/A)  Patient Location: Endoscopy Unit  Anesthesia Type:General  Level of Consciousness: awake, alert , oriented and patient cooperative  Airway & Oxygen Therapy: Patient Spontanous Breathing and Patient connected to nasal cannula oxygen  Post-op Assessment: Report given to RN, Post -op Vital signs reviewed and stable and Patient moving all extremities X 4  Post vital signs: Reviewed and stable  Last Vitals:  Filed Vitals:   10/08/15 1410  BP: 122/79  Pulse: 81  Temp: 36.8 C  Resp: 16    Last Pain: There were no vitals filed for this visit.       Complications: No apparent anesthesia complications

## 2015-10-08 NOTE — Anesthesia Preprocedure Evaluation (Signed)
Anesthesia Evaluation  Patient identified by MRN, date of birth, ID band Patient awake    Reviewed: Allergy & Precautions, H&P , NPO status , Patient's Chart, lab work & pertinent test results  History of Anesthesia Complications Negative for: history of anesthetic complications  Airway Mallampati: III  TM Distance: >3 FB Neck ROM: full    Dental  (+) Poor Dentition, Chipped, Caps   Pulmonary neg shortness of breath, sleep apnea ,    Pulmonary exam normal breath sounds clear to auscultation       Cardiovascular Exercise Tolerance: Good (-) angina(-) Past MI and (-) DOE Normal cardiovascular exam+ dysrhythmias Atrial Fibrillation  Rhythm:regular Rate:Normal     Neuro/Psych PSYCHIATRIC DISORDERS Anxiety negative neurological ROS     GI/Hepatic negative GI ROS, Neg liver ROS,   Endo/Other  negative endocrine ROS  Renal/GU negative Renal ROS  negative genitourinary   Musculoskeletal   Abdominal   Peds  Hematology negative hematology ROS (+)   Anesthesia Other Findings Past Medical History:   Hyperlipidemia                                               Meningitis                                                   Deviated septum                                              Hypertrophy of nasal turbinates                              Rhinitis, allergic                                           Sleep apnea                                                    Comment:C-PAP   Dysrhythmia                                                    Comment:A-FIB/ DR Nehemiah Massed   Anxiety                                                      Dental crowns present  Comment:FRONT TWO TEETH   Obesity                                                      Seasonal allergies                                          Past Surgical History:   APPENDECTOMY                                     1974        HERNIA REPAIR                                   Right 01-17-01        Comment:Indirect hernia, Prolene hernia system.   HERNIA REPAIR                                   Left 03/23/14       Comment:Left indirect inguinal hernia repair, large               ultra Pro mesh   BACK SURGERY                                     2015         SEPTOPLASTY                                     N/A 05/21/2015     Comment:Procedure: SEPTOPLASTY;  Surgeon: Beverly Gust, MD;  Location: [place];                Service: ENT;  Laterality: N/A;  C-PAP   NASAL TURBINATE REDUCTION                       Bilateral 05/21/2015     Comment:Procedure: TURBINATE REDUCTION/SUBMUCOSAL               RESECTION;  Surgeon: Beverly Gust, MD;                Location: [place];  Service: ENT;                Laterality: Bilateral;   COLONOSCOPY                                                  BMI    Body Mass Index   30.80 kg/m 2      Reproductive/Obstetrics negative OB ROS  Anesthesia Physical Anesthesia Plan  ASA: III  Anesthesia Plan: General   Post-op Pain Management:    Induction:   Airway Management Planned:   Additional Equipment:   Intra-op Plan:   Post-operative Plan:   Informed Consent: I have reviewed the patients History and Physical, chart, labs and discussed the procedure including the risks, benefits and alternatives for the proposed anesthesia with the patient or authorized representative who has indicated his/her understanding and acceptance.   Dental Advisory Given  Plan Discussed with: Anesthesiologist, CRNA and Surgeon  Anesthesia Plan Comments:         Anesthesia Quick Evaluation

## 2015-10-08 NOTE — H&P (Signed)
Primary Care Physician:  Valera Castle, MD Primary Gastroenterologist:  Dr. Vira Agar  Pre-Procedure History & Physical: HPI:  Keith Trimmer. is a 50 y.o. male is here for an colonoscopy.   Past Medical History  Diagnosis Date  . Hyperlipidemia   . Meningitis   . Deviated septum   . Hypertrophy of nasal turbinates   . Rhinitis, allergic   . Sleep apnea     C-PAP  . Dysrhythmia     A-FIB/ DR Nehemiah Massed  . Anxiety   . Dental crowns present     FRONT TWO TEETH  . Obesity   . Seasonal allergies     Past Surgical History  Procedure Laterality Date  . Appendectomy  1974  . Hernia repair Right 01-17-01    Indirect hernia, Prolene hernia system.  Marland Kitchen Hernia repair Left 03/23/14    Left indirect inguinal hernia repair, large ultra Pro mesh  . Back surgery  2015  . Septoplasty N/A 05/21/2015    Procedure: SEPTOPLASTY;  Surgeon: Beverly Gust, MD;  Location: [place];  Service: ENT;  Laterality: N/A;  C-PAP  . Nasal turbinate reduction Bilateral 05/21/2015    Procedure: TURBINATE REDUCTION/SUBMUCOSAL RESECTION;  Surgeon: Beverly Gust, MD;  Location: [place];  Service: ENT;  Laterality: Bilateral;  . Colonoscopy      Prior to Admission medications   Medication Sig Start Date End Date Taking? Authorizing Provider  aspirin 81 MG tablet Take 81 mg by mouth daily.   Yes Historical Provider, MD  cetirizine (ZYRTEC) 10 MG tablet Reported on 10/07/2015 03/11/15  Yes Historical Provider, MD  flecainide (TAMBOCOR) 100 MG tablet Take 100 mg by mouth 2 (two) times daily. AM AND PM   Yes Historical Provider, MD  metoprolol tartrate (LOPRESSOR) 25 MG tablet Take by mouth 2 (two) times daily. AM AND PM 07/06/14 10/08/15 Yes Historical Provider, MD  naproxen sodium (ANAPROX) 220 MG tablet Take 220 mg by mouth as needed.    Yes Historical Provider, MD  sulfamethoxazole-trimethoprim (BACTRIM) 400-80 MG tablet Take 1 tablet by mouth 2 (two) times daily. 05/21/15  Yes  Beverly Gust, MD  apixaban (ELIQUIS) 5 MG TABS tablet Take 5 mg by mouth 2 (two) times daily. Reported on 10/08/2015    Historical Provider, MD  Hydrocodone-Acetaminophen 5-300 MG TABS Take 1-2 tablets by mouth every 4 (four) hours as needed. 05/21/15   Beverly Gust, MD    Allergies as of 09/24/2015  . (No Known Allergies)    Family History  Problem Relation Age of Onset  . Cancer Mother     colon  . Heart disease Father   . Hyperlipidemia Father   . CAD Father     Social History   Social History  . Marital Status: Married    Spouse Name: N/A  . Number of Children: N/A  . Years of Education: N/A   Occupational History  . Not on file.   Social History Main Topics  . Smoking status: Never Smoker   . Smokeless tobacco: Never Used  . Alcohol Use: No     Comment: occasionally  . Drug Use: No  . Sexual Activity: Yes   Other Topics Concern  . Not on file   Social History Narrative    Review of Systems: See HPI, otherwise negative ROS  Physical Exam: BP 122/79 mmHg  Pulse 81  Temp(Src) 98.3 F (36.8 C) (Tympanic)  Resp 16  Ht 6\' 2"  (1.88 m)  Wt 108.863 kg (240  lb)  BMI 30.80 kg/m2  SpO2 97% General:   Alert,  pleasant and cooperative in NAD Head:  Normocephalic and atraumatic. Neck:  Supple; no masses or thyromegaly. Lungs:  Clear throughout to auscultation.    Heart:  Regular rate and rhythm. Abdomen:  Soft, nontender and nondistended. Normal bowel sounds, without guarding, and without rebound.   Neurologic:  Alert and  oriented x4;  grossly normal neurologically.  Impression/Plan: Keith Heman. is here for an colonoscopy to be performed for FH colon cancer in mother  Risks, benefits, limitations, and alternatives regarding  colonoscopy have been reviewed with the patient.  Questions have been answered.  All parties agreeable.   Gaylyn Cheers, MD  10/08/2015, 3:36 PM

## 2015-10-08 NOTE — Op Note (Signed)
[facility] Patient Name: Keith Torres Procedure Date: 10/08/2015 3:19 PM MRN: [id number] Account #: [id number] Date of Birth: [birth year] Admit Type: Outpatient Age: 50 Room: [facility] ENDO ROOM 1 Gender: Male Note Status: Finalized Procedure:            Colonoscopy Indications:          Screening in patient at increased risk: Family history                        of 1st-degree relative with colorectal cancer Providers:            Manya Silvas, MD Referring MD:         Wynona Canes. Kym Groom, MD (Referring MD) Medicines:            Propofol per Anesthesia Complications:        No immediate complications. Procedure:            Pre-Anesthesia Assessment:                       - After reviewing the risks and benefits, the patient                        was deemed in satisfactory condition to undergo the                        procedure.                       After obtaining informed consent, the colonoscope was                        passed under direct vision. Throughout the procedure,                        the patient's blood pressure, pulse, and oxygen                        saturations were monitored continuously. The                        Colonoscope was introduced through the anus and                        advanced to the the cecum, identified by appendiceal                        orifice and ileocecal valve. The colonoscopy was                        performed without difficulty. The patient tolerated the                        procedure well. The quality of the bowel preparation                        was excellent. Findings:      Three sessile polyps were found in the recto-sigmoid colon and cecum.       The polyps were diminutive in size. These polyps were removed with a       jumbo cold forceps. Resection and retrieval were complete.  Internal hemorrhoids were found during endoscopy. The hemorrhoids were       small and Grade I  (internal hemorrhoids that do not prolapse).      The exam was otherwise without abnormality. Impression:           - Three diminutive polyps at the recto-sigmoid colon                        and in the cecum, removed with a jumbo cold forceps.                        Resected and retrieved.                       - Internal hemorrhoids.                       - The examination was otherwise normal. Recommendation:       - Await pathology results. Manya Silvas, MD 10/08/2015 3:59:43 PM This report has been signed electronically. Number of Addenda: 0 Note Initiated On: 10/08/2015 3:19 PM Scope Withdrawal Time: 0 hours 13 minutes 28 seconds  Total Procedure Duration: 0 hours 15 minutes 54 seconds       [facility]

## 2015-10-10 ENCOUNTER — Encounter: Payer: Self-pay | Admitting: Unknown Physician Specialty

## 2015-10-11 NOTE — Anesthesia Postprocedure Evaluation (Signed)
Anesthesia Post Note  Patient: Keith Torres.  Procedure(s) Performed: Procedure(s) (LRB): COLONOSCOPY WITH PROPOFOL (N/A)  Patient location during evaluation: Endoscopy Anesthesia Type: General Level of consciousness: awake and alert Pain management: pain level controlled Vital Signs Assessment: post-procedure vital signs reviewed and stable Respiratory status: spontaneous breathing, nonlabored ventilation, respiratory function stable and patient connected to nasal cannula oxygen Cardiovascular status: blood pressure returned to baseline and stable Postop Assessment: no signs of nausea or vomiting Anesthetic complications: no    Last Vitals:  Filed Vitals:   10/08/15 1625 10/08/15 1635  BP: 115/87 112/91  Pulse: 82 76  Temp:    Resp: 21 17    Last Pain:  Filed Vitals:   10/09/15 1432  PainSc: 0-No pain                 Martha Clan

## 2015-10-12 ENCOUNTER — Encounter: Payer: Self-pay | Admitting: Physician Assistant

## 2015-10-12 ENCOUNTER — Ambulatory Visit: Payer: Self-pay | Admitting: Physician Assistant

## 2015-10-12 VITALS — BP 110/70 | HR 86 | Temp 98.1°F

## 2015-10-12 DIAGNOSIS — J018 Other acute sinusitis: Secondary | ICD-10-CM

## 2015-10-12 MED ORDER — AMOXICILLIN 875 MG PO TABS: 875.0000 mg | ORAL_TABLET | Freq: Two times a day (BID) | ORAL | Status: DC

## 2015-10-12 NOTE — Progress Notes (Signed)
S: C/o runny nose and congestion for 7 days, no fever, chills, cp/sob, v/d; mucus is green and thick, cough is sporadic, c/o of facial and dental pain.   Using otc meds:   O: PE: vitals wnl, nad, perrl eomi, normocephalic, tms dull, nasal mucosa red and swollen, throat injected, neck supple no lymph, lungs c t a, cv rrr, neuro intact  A:  Acute sinusitis   P: amoxil 875mg  , drink fluids, continue regular meds , use otc meds of choice, return if not improving in 5 days, return earlier if worsening

## 2015-10-13 LAB — SURGICAL PATHOLOGY

## 2016-04-25 ENCOUNTER — Ambulatory Visit: Payer: Self-pay | Admitting: Physician Assistant

## 2016-04-25 ENCOUNTER — Encounter: Payer: Self-pay | Admitting: Physician Assistant

## 2016-04-25 VITALS — BP 110/80 | HR 87 | Temp 98.2°F

## 2016-04-25 DIAGNOSIS — J209 Acute bronchitis, unspecified: Secondary | ICD-10-CM

## 2016-04-25 MED ORDER — AZITHROMYCIN 250 MG PO TABS: ORAL_TABLET | ORAL | 0 refills | Status: DC

## 2016-04-25 NOTE — Progress Notes (Addendum)
S: C/o cough and congestion  chest is sore from coughing, had a little fever, chills, last night and the night before, mucus is clear and with very little green; keeping pt awake at night;  denies cardiac type chest pain or sob, v/d, abd pain Remainder ros neg  O: vitals wnl, nad, tms clear, throat injected, neck supple no lymph, lungs c t a, cv rrr, neuro intact, cough is deep and dry  A:  Acute bronchitis   P:  rx medication: zpack, use otc meds, tylenol or motrin as needed for fever/chills, return if not better in 3 -5 days, return earlier if worsening Changed rx to omnicef due to interaction of zpack and flecamide

## 2016-04-26 MED ORDER — CEFDINIR 300 MG PO CAPS: 300.0000 mg | ORAL_CAPSULE | Freq: Two times a day (BID) | ORAL | 0 refills | Status: DC

## 2016-04-26 NOTE — Addendum Note (Signed)
Addended by: Versie Starks on: 04/26/2016 12:30 PM   Modules accepted: Orders

## 2016-06-09 ENCOUNTER — Encounter: Payer: Self-pay | Admitting: Physician Assistant

## 2016-06-09 ENCOUNTER — Ambulatory Visit: Payer: Self-pay | Admitting: Physician Assistant

## 2016-06-09 VITALS — BP 120/70 | HR 73 | Temp 98.3°F

## 2016-06-09 DIAGNOSIS — J069 Acute upper respiratory infection, unspecified: Secondary | ICD-10-CM

## 2016-06-09 NOTE — Progress Notes (Addendum)
S: C/o runny nose and congestion with dry cough for 6-7 days, + fever, chills at night only, denies cp/sob, v/d; cough is dry, no mucus production, states he got better with the omnicef and was well for a few weeks before this started, states he really feels good during the day but gets worse at night, did not get flu vaccine  Using otc meds: mucinex  O: PE: vitals wnl, nad,  perrl eomi, normocephalic, tms dull, nasal mucosa red and swollen, throat injected, neck supple no lymph, lungs c t a, cv rrr, neuro intact,  A:  Acute flu like illness   P: drink fluids, continue regular meds , use otc meds of choice, return if not improving in 5 days, return earlier if worsening , if not better by Monday pt is to call office and will call in an antibiotic, put zpack on allergy list due to interaction with flecainide  Pt called office, states he is not better, called in augmentin to cv [address]

## 2016-06-12 MED ORDER — AMOXICILLIN-POT CLAVULANATE 875-125 MG PO TABS: 1.0000 | ORAL_TABLET | Freq: Two times a day (BID) | ORAL | 0 refills | Status: DC

## 2016-06-12 NOTE — Addendum Note (Signed)
Addended by: Versie Starks on: 06/12/2016 04:47 PM   Modules accepted: Orders

## 2017-04-10 ENCOUNTER — Other Ambulatory Visit: Payer: Self-pay

## 2017-04-10 DIAGNOSIS — Z299 Encounter for prophylactic measures, unspecified: Secondary | ICD-10-CM

## 2017-04-10 NOTE — Progress Notes (Signed)
Patient came in to have blood drawn for testing per Dr. Freida Busman Olmedo's orders.

## 2017-04-11 LAB — SPECIMEN STATUS

## 2017-04-12 LAB — SPECIMEN STATUS REPORT

## 2017-04-19 ENCOUNTER — Encounter: Payer: Self-pay | Admitting: Physician Assistant

## 2017-04-19 ENCOUNTER — Ambulatory Visit: Payer: Self-pay | Admitting: Physician Assistant

## 2017-04-19 VITALS — BP 130/80 | HR 85 | Temp 98.5°F | Resp 16

## 2017-04-19 DIAGNOSIS — M545 Low back pain, unspecified: Secondary | ICD-10-CM

## 2017-04-19 MED ORDER — BACLOFEN 10 MG PO TABS: 10.0000 mg | ORAL_TABLET | Freq: Three times a day (TID) | ORAL | 0 refills | Status: DC

## 2017-04-19 MED ORDER — PREDNISONE 10 MG (21) PO TBPK: ORAL_TABLET | ORAL | 0 refills | Status: DC

## 2017-04-19 NOTE — Progress Notes (Signed)
S: She complains of low back pain for 2 weeks, states he has been building a barn and lifting heavy lumbar, doesn't remember an exact injury but feels worse in the morning, states he doesn't notice it when he is at work but by the time he gets home his back is hurting, pain is spread across the lower back, states he does have some tingling in the left foot, has history of spinal surgery in the lumbar area, no difficulty walking, no difficulty with bowels or bladder  O: Vitals are normal, NAD, lumbar spine is not tender, pain is reproduced with forward flexion, paravertebral muscles are tender in the lumbar area, patient is able to stand on toes and heels without difficulty, neurovascular is intact  A: Acute midline low back pain with history of spinal surgery  P: Sterapred DS 10 mg 6 day Dosepak, baclofen 10 mg 3 times a day, if patient is not better in 5-7 days should refer to orthopedics

## 2017-04-23 LAB — PSA: PROSTATE SPECIFIC AG, SERUM: 1.6 ng/mL (ref 0.0–4.0)

## 2017-04-23 LAB — COMPREHENSIVE METABOLIC PANEL
A/G RATIO: 1.9 (ref 1.2–2.2)
ALK PHOS: 62 IU/L (ref 39–117)
ALT: 30 IU/L (ref 0–44)
AST: 20 IU/L (ref 0–40)
Albumin: 4.4 g/dL (ref 3.5–5.5)
BILIRUBIN TOTAL: 0.5 mg/dL (ref 0.0–1.2)
BUN/Creatinine Ratio: 10 (ref 9–20)
BUN: 11 mg/dL (ref 6–24)
CHLORIDE: 103 mmol/L (ref 96–106)
CO2: 20 mmol/L (ref 20–29)
Calcium: 8.9 mg/dL (ref 8.7–10.2)
Creatinine, Ser: 1.06 mg/dL (ref 0.76–1.27)
GFR calc Af Amer: 93 mL/min/{1.73_m2} (ref >59)
GFR calc non Af Amer: 80 mL/min/{1.73_m2} (ref >59)
GLUCOSE: 91 mg/dL (ref 65–99)
Globulin, Total: 2.3 g/dL (ref 1.5–4.5)
POTASSIUM: 3.9 mmol/L (ref 3.5–5.2)
Sodium: 143 mmol/L (ref 134–144)
Total Protein: 6.7 g/dL (ref 6.0–8.5)

## 2017-04-23 LAB — LIPID PANEL
CHOLESTEROL TOTAL: 221 mg/dL — AB (ref 100–199)
Chol/HDL Ratio: 4.3 ratio (ref 0.0–5.0)
HDL: 52 mg/dL (ref >39)
LDL Calculated: 144 mg/dL — ABNORMAL HIGH (ref 0–99)
TRIGLYCERIDES: 123 mg/dL (ref 0–149)
VLDL Cholesterol Cal: 25 mg/dL (ref 5–40)

## 2017-04-23 LAB — HIV ANTIBODY (ROUTINE TESTING W REFLEX): HIV Screen 4th Generation wRfx: NONREACTIVE

## 2017-10-26 ENCOUNTER — Ambulatory Visit: Payer: Self-pay | Admitting: Family Medicine

## 2017-10-26 VITALS — BP 131/78 | HR 89 | Resp 18

## 2017-10-26 DIAGNOSIS — R059 Cough, unspecified: Secondary | ICD-10-CM

## 2017-10-26 DIAGNOSIS — R062 Wheezing: Secondary | ICD-10-CM

## 2017-10-26 DIAGNOSIS — R05 Cough: Secondary | ICD-10-CM

## 2017-10-26 MED ORDER — SPACER/AERO CHAMBER MOUTHPIECE MISC: 0 refills | Status: DC

## 2017-10-26 MED ORDER — BENZONATATE 200 MG PO CAPS: 200.0000 mg | ORAL_CAPSULE | Freq: Every evening | ORAL | 0 refills | Status: DC | PRN

## 2017-10-26 NOTE — Progress Notes (Addendum)
Subjective: Cough and congestion     Keith Elza. is a 52 y.o. male who presents for evaluation of nonproductive cough and  nasal congestion for 1 week.  Patient reports that he scheduled this appointment a few days ago but that since then his symptoms have improved significantly.  The only other symptom reported by the patient is intermittent mild wheezing for the first few days of illness that has completely resolved and not recurred.  Patient reports that on April 18 he was seen  at [facility] clinic and diagnosed with bronchitis.  Patient was treated with amoxicillin, albuterol, and prednisone.  Patient reports only using his albuterol inhaler once during this illness.  Patient reports symptoms completely resolved and he was asymptomatic prior to 1 week ago when his current symptoms began.  Patient reports coughing both during the day and night, which has prevented him from sleeping well. Treatment to date: Cough syrup, patient unsure the name and Ladona Ridgel (ran out of both). Denies rash, nausea, vomiting, diarrhea, SOB, chest or back pain, ear pain, sore throat, difficulty swallowing, confusion, purulent nasal discharge, anosmia/hyposmia, dental pain, facial pressure/unilateral facial pain, pain exacerbated by bending over, headache, body aches, fatigue, fever, chills, severe symptoms, or initial improvement and then worsening of symptoms. History of smoking, asthma, COPD: Negative.  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  Denies any history of abnormal lung function or lung disease. History of recurrent sinus and/or lung infections: Negative. Patient reported medical history: Atrial fibrillation.  Review of Systems Pertinent items noted in HPI and remainder of comprehensive ROS otherwise negative.     Objective:   Physical Exam General: Awake, alert, and oriented. No acute distress. Well developed, hydrated and nourished. Appears  stated age. Nontoxic appearance.  HEENT: No PND noted.  No erythema to posterior oropharynx.  No edema or exudates of pharynx or tonsils. No erythema or bulging of TM.  Mild erythema/edema to nasal mucosa. Sinuses nontender. Supple neck without adenopathy. Cardiac: Heart rate and rhythm are normal. No murmurs, gallops, or rubs are auscultated. S1 and S2 are heard and are of normal intensity.  Respiratory: No signs of respiratory distress. Lungs clear. No tachypnea. Able to speak in full sentences without dyspnea. Nonlabored respirations.  Skin: Skin is warm, dry and intact. Appropriate color for ethnicity. No cyanosis noted.    Assessment:    viral upper respiratory illness   Plan:    Discussed diagnosis and treatment of URI.  Patient agrees with this treatment plan reports symptoms continue to resolve. Discussed the importance of avoiding unnecessary antibiotic therapy. Suggested symptomatic OTC remedies. Nasal saline spray for congestion.   Advised patient to avoid lung irritants such as tobacco smoke and wear a mask around sawdust. Patient reported having a difficult time using inhaler since he never used one before.  Discussed using a spacer with his inhaler and provided him with education on how to use an inhaler.  Discussed side/adverse effects of albuterol and safe use of this. Advised him to monitor his heart rate and blood pressure if he uses this.  Recommended only using this if needed. Prescribed Tessalon Perles to use as needed at night.  Patient has taken this previously and tolerated it well.  Discussed side/adverse effects. Discussed red flag symptoms and circumstances with which to seek medical care.   New Prescriptions   BENZONATATE (TESSALON) 200 MG CAPSULE    Take 1 capsule (200 mg total)  by mouth at bedtime as needed for cough.

## 2017-10-26 NOTE — Addendum Note (Signed)
Addended by: Tyrone Nine on: 10/26/2017 02:25 PM   Modules accepted: Orders

## 2017-11-08 ENCOUNTER — Ambulatory Visit
Admission: RE | Admit: 2017-11-08 | Discharge: 2017-11-08 | Disposition: A | Discharge status: Home / Self Care | Payer: Managed Care, Other (non HMO) | Source: Ambulatory Visit | Attending: Family Medicine | Admitting: Family Medicine

## 2017-11-08 ENCOUNTER — Ambulatory Visit: Payer: Self-pay | Admitting: Family Medicine

## 2017-11-08 VITALS — BP 132/73 | HR 88 | Temp 98.3°F | Resp 20

## 2017-11-08 DIAGNOSIS — J398 Other specified diseases of upper respiratory tract: Secondary | ICD-10-CM | POA: Insufficient documentation

## 2017-11-08 DIAGNOSIS — R05 Cough: Secondary | ICD-10-CM

## 2017-11-08 DIAGNOSIS — J309 Allergic rhinitis, unspecified: Secondary | ICD-10-CM

## 2017-11-08 DIAGNOSIS — R059 Cough, unspecified: Secondary | ICD-10-CM

## 2017-11-08 MED ORDER — FLUTICASONE PROPIONATE 50 MCG/ACT NA SUSP: 2.0000 | Freq: Every day | NASAL | 1 refills | Status: DC

## 2017-11-08 NOTE — Progress Notes (Signed)
Subjective: Cough and congestion    10/26/2017: Keith Torres. is a 52 y.o. male who presents for evaluation of nonproductive cough and  nasal congestion for 1 week.  Patient reports that he scheduled this appointment a few days ago but that since then his symptoms have improved significantly.  The only other symptom reported by the patient is intermittent mild wheezing for the first few days of illness that has completely resolved and not recurred.  Patient reports that on April 18 he was seen  at [facility] clinic and diagnosed with bronchitis.  Patient was treated with amoxicillin, albuterol, and prednisone.  Patient reports only using his albuterol inhaler once during this illness.  Patient reports symptoms completely resolved and he was asymptomatic prior to 1 week ago when his current symptoms began.  Patient reports coughing both during the day and night, which has prevented him from sleeping well. Treatment to date: Cough syrup, patient unsure the name and Ladona Ridgel (ran out of both). Denies rash, nausea, vomiting, diarrhea, SOB, chest or back pain, ear pain, sore throat, difficulty swallowing, confusion, purulent nasal discharge, anosmia/hyposmia, dental pain, facial pressure/unilateral facial pain, pain exacerbated by bending over, headache, body aches, fatigue, fever, chills, severe symptoms, or initial improvement and then worsening of symptoms. History of smoking, asthma, COPD: Negative.  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  Denies any history of abnormal lung function or lung disease. History of recurrent sinus and/or lung infections: Negative. Patient reported medical history: Atrial fibrillation.  11/08/2017: Patient reports that following his previous visit on 10/26/2017 his nasal congestion and cough subsided but keep recurring intermittently over the past 2 months.  Patient reports nasal congestion, sore throat, sneezing, and  nonproductive cough worsened earlier this week but have improved significantly in the last 24 hours.  Patient's only symptom currently is a nonproductive cough, mild shortness of breath with coughing, and slight chest tightness with coughing.  Denies chest or back pain otherwise.  Patient used his albuterol inhaler once yesterday and once today with and without relief.  Denies any other cardiac symptoms.  Patient reports the rest of his symptoms have resolved.  Patient reports feeling like he had chills 2 days ago but never took his temperature. This has resolved and not recurred.  Patient  experienced a significant amount of sneezing earlier this week.  Patient is unsure if he has a history of allergic rhinitis but it is noted in his medical record.  Patient is not currently taking any medications for allergic rhinitis.  Patient denies any wheezing. Denies rash, nausea, vomiting, diarrhea, ear pain, sore throat, difficulty swallowing, confusion, purulent nasal discharge, anosmia/hyposmia, dental pain, facial pressure/unilateral facial pain, pain exacerbated by bending over, headache, body aches, fatigue, fever, chills, severe symptoms, or initial improvement and then worsening of symptoms.  Overall patient symptoms have improved significantly and almost completely resolved.  Review of Systems Pertinent items noted in HPI and remainder of comprehensive ROS otherwise negative.     Objective:   Physical Exam General: Awake, alert, and oriented. No acute distress. Well developed, hydrated and nourished. Appears stated age. Nontoxic appearance.  HEENT: No PND noted.  No erythema to posterior oropharynx.  No edema or exudates of pharynx or tonsils. No erythema or bulging of TM.  Pale/boggy nasal mucosa. Sinuses nontender. Supple neck without adenopathy. Cardiac: Heart rate and rhythm are normal. No murmurs, gallops, or rubs are auscultated. S1 and S2 are heard  and are of normal intensity.  Respiratory: No signs  of respiratory distress. Lungs clear. No tachypnea. Able to speak in full sentences without dyspnea. Nonlabored respirations.  Skin: Skin is warm, dry and intact. Appropriate color for ethnicity. No cyanosis noted.   Diagnostics:  EXAM: CHEST - 2 VIEW  COMPARISON:  11/08/2017 remote and healed posterior right fifth rib fracture  FINDINGS: Normal heart size and aortic contours. Mild distortion of the lateral left trachea at the thoracic inlet. No acute infiltrate or edema. No effusion or pneumothorax. No acute osseous findings.  IMPRESSION: 1. Clear lungs. 2. Mild left-sided contour deformity of the trachea at the thoracic inlet, please correlate with exam for thyroid nodule or other mass.  Electronically Signed: By: Monte Fantasia M.D. On: 11/08/2017 12:34  ADDENDUM REPORT: 11/08/2017 13:29  ADDENDUM: Clarification that there is no prior imaging available for comparison. "Remote and healed posterior right fifth rib fracture" should be in the finding section.   Electronically Signed   By: Monte Fantasia M.D.   On: 11/08/2017 13:29   Assessment:   Allergic rhinitis Abnormal chest x-ray  Plan:   Discussed diagnosis and treatment. Patient agrees with this treatment plan, reports symptoms continue to resolve. Discussed the importance of avoiding unnecessary antibiotic therapy. Discussed allergen avoidance. Suggested symptomatic OTC remedies for allergic rhinitis. Nasal saline spray for congestion. Prescribed Flonase, discussed use, and side/adverse effects. Advised patient to avoid lung irritants such as tobacco smoke and wear a mask around sawdust. Advised patient use his albuterol inhaler as needed.  Discussed side/adverse effects of albuterol and safe use of this. Advised him to monitor his heart rate and blood pressure if he uses this.  Patient reports Ladona Ridgel have been effective in controlling his cough at night and that he has plenty left over to  use as needed.  Denies needing a refill for this.   Called and spoke with the patient and discussed the abnormal chest x-ray findings with him.  Discussed the differential diagnosis and that this could possibly play a role in his current symptoms.  Patient reports a sternal injury 30 years ago and wondered if it could be the cause of the abnormal findings.  No x-ray available for comparison.  Advised the patient to call today to make an appointment with his primary care provider for as soon as possible for follow up regarding his symptoms and further evaluation of his xray results, as additional imaging may be necessary that we are unable to do in our clinic. Consulted collaborating physician Dr. Rosanna Randy regarding the care of this patient, who is in agreement with this plan. Discussed red flag symptoms and circumstances with which to seek medical care.   New Prescriptions   FLUTICASONE (FLONASE) 50 MCG/ACT NASAL SPRAY    Place 2 sprays into both nostrils daily.

## 2017-11-15 ENCOUNTER — Other Ambulatory Visit: Payer: Self-pay | Admitting: Family Medicine

## 2017-11-15 DIAGNOSIS — R221 Localized swelling, mass and lump, neck: Secondary | ICD-10-CM

## 2017-11-16 ENCOUNTER — Ambulatory Visit: Payer: PRIVATE HEALTH INSURANCE

## 2017-11-19 ENCOUNTER — Ambulatory Visit
Admission: RE | Admit: 2017-11-19 | Discharge: 2017-11-19 | Disposition: A | Discharge status: Home / Self Care | Payer: Managed Care, Other (non HMO) | Source: Ambulatory Visit | Attending: Family Medicine | Admitting: Family Medicine

## 2017-11-19 ENCOUNTER — Ambulatory Visit: Payer: PRIVATE HEALTH INSURANCE

## 2017-11-19 DIAGNOSIS — R221 Localized swelling, mass and lump, neck: Secondary | ICD-10-CM

## 2017-11-19 DIAGNOSIS — J349 Unspecified disorder of nose and nasal sinuses: Secondary | ICD-10-CM | POA: Diagnosis not present

## 2017-11-19 MED ORDER — IOPAMIDOL (ISOVUE-300) INJECTION 61%
75.0000 mL | Freq: Once | INTRAVENOUS | Status: AC | PRN
  Administered 2017-11-19: 75 mL via INTRAVENOUS

## 2018-01-07 ENCOUNTER — Telehealth: Payer: Self-pay

## 2018-01-07 NOTE — Telephone Encounter (Signed)
Received fax requesting refill for Fluticasone 50 MCG Spray.

## 2018-01-07 NOTE — Telephone Encounter (Signed)
Please advise the pharmacy to contact the patient's primary care provider for refills.

## 2018-01-07 NOTE — Telephone Encounter (Signed)
Pharmacy notified to contact pt's PCP

## 2018-05-30 ENCOUNTER — Emergency Department: Payer: Managed Care, Other (non HMO)

## 2018-05-30 ENCOUNTER — Other Ambulatory Visit: Payer: Self-pay

## 2018-05-30 ENCOUNTER — Observation Stay
Admission: EM | Admit: 2018-05-30 | Discharge: 2018-05-31 | Disposition: A | Discharge status: Home / Self Care | Payer: Managed Care, Other (non HMO) | Attending: Internal Medicine | Admitting: Internal Medicine

## 2018-05-30 ENCOUNTER — Encounter: Payer: Self-pay | Admitting: Emergency Medicine

## 2018-05-30 DIAGNOSIS — E785 Hyperlipidemia, unspecified: Secondary | ICD-10-CM | POA: Insufficient documentation

## 2018-05-30 DIAGNOSIS — Z7951 Long term (current) use of inhaled steroids: Secondary | ICD-10-CM | POA: Insufficient documentation

## 2018-05-30 DIAGNOSIS — G4733 Obstructive sleep apnea (adult) (pediatric): Secondary | ICD-10-CM | POA: Diagnosis present

## 2018-05-30 DIAGNOSIS — E669 Obesity, unspecified: Secondary | ICD-10-CM | POA: Insufficient documentation

## 2018-05-30 DIAGNOSIS — F419 Anxiety disorder, unspecified: Secondary | ICD-10-CM | POA: Diagnosis present

## 2018-05-30 DIAGNOSIS — I4891 Unspecified atrial fibrillation: Secondary | ICD-10-CM

## 2018-05-30 DIAGNOSIS — J101 Influenza due to other identified influenza virus with other respiratory manifestations: Principal | ICD-10-CM | POA: Diagnosis present

## 2018-05-30 DIAGNOSIS — Z8249 Family history of ischemic heart disease and other diseases of the circulatory system: Secondary | ICD-10-CM | POA: Insufficient documentation

## 2018-05-30 DIAGNOSIS — E860 Dehydration: Secondary | ICD-10-CM | POA: Insufficient documentation

## 2018-05-30 DIAGNOSIS — E7801 Familial hypercholesterolemia: Secondary | ICD-10-CM | POA: Insufficient documentation

## 2018-05-30 DIAGNOSIS — Z79899 Other long term (current) drug therapy: Secondary | ICD-10-CM | POA: Insufficient documentation

## 2018-05-30 DIAGNOSIS — I480 Paroxysmal atrial fibrillation: Secondary | ICD-10-CM | POA: Diagnosis present

## 2018-05-30 HISTORY — DX: Unspecified atrial fibrillation: I48.91

## 2018-05-30 LAB — CBC
HCT: 48.1 % (ref 39.0–52.0)
HEMOGLOBIN: 17.1 g/dL — AB (ref 13.0–17.0)
MCH: 30.8 pg (ref 26.0–34.0)
MCHC: 35.6 g/dL (ref 30.0–36.0)
MCV: 86.7 fL (ref 80.0–100.0)
Platelets: 168 10*3/uL (ref 150–400)
RBC: 5.55 MIL/uL (ref 4.22–5.81)
RDW: 11.9 % (ref 11.5–15.5)
WBC: 5.6 10*3/uL (ref 4.0–10.5)
nRBC: 0 % (ref 0.0–0.2)

## 2018-05-30 LAB — URINALYSIS, COMPLETE (UACMP) WITH MICROSCOPIC
Bacteria, UA: NONE SEEN
Bilirubin Urine: NEGATIVE
Glucose, UA: NEGATIVE mg/dL
Hgb urine dipstick: NEGATIVE
Ketones, ur: 5 mg/dL — AB
Leukocytes, UA: NEGATIVE
Nitrite: NEGATIVE
PROTEIN: NEGATIVE mg/dL
SQUAMOUS EPITHELIAL / LPF: NONE SEEN (ref 0–5)
Specific Gravity, Urine: 1.03 (ref 1.005–1.030)
pH: 5 (ref 5.0–8.0)

## 2018-05-30 LAB — COMPREHENSIVE METABOLIC PANEL
ALBUMIN: 4 g/dL (ref 3.5–5.0)
ALK PHOS: 62 U/L (ref 38–126)
ALT: 30 U/L (ref 0–44)
ANION GAP: 11 (ref 5–15)
AST: 35 U/L (ref 15–41)
BUN: 15 mg/dL (ref 6–20)
CALCIUM: 8.7 mg/dL — AB (ref 8.9–10.3)
CO2: 24 mmol/L (ref 22–32)
Chloride: 101 mmol/L (ref 98–111)
Creatinine, Ser: 1.2 mg/dL (ref 0.61–1.24)
GFR calc Af Amer: >60 mL/min (ref >60)
GFR calc non Af Amer: >60 mL/min (ref >60)
GLUCOSE: 140 mg/dL — AB (ref 70–99)
POTASSIUM: 3.3 mmol/L — AB (ref 3.5–5.1)
SODIUM: 136 mmol/L (ref 135–145)
Total Bilirubin: 0.7 mg/dL (ref 0.3–1.2)
Total Protein: 7.1 g/dL (ref 6.5–8.1)

## 2018-05-30 LAB — INFLUENZA PANEL BY PCR (TYPE A & B)
Influenza A By PCR: POSITIVE — AB
Influenza B By PCR: NEGATIVE

## 2018-05-30 LAB — LIPASE, BLOOD: Lipase: 31 U/L (ref 11–51)

## 2018-05-30 MED ORDER — ACETAMINOPHEN 325 MG PO TABS
650.0000 mg | ORAL_TABLET | Freq: Four times a day (QID) | ORAL | Status: DC | PRN
  Administered 2018-05-31: 650 mg via ORAL
  Filled 2018-05-30: qty 2

## 2018-05-30 MED ORDER — FLECAINIDE ACETATE 100 MG PO TABS
100.0000 mg | ORAL_TABLET | Freq: Two times a day (BID) | ORAL | Status: DC
  Administered 2018-05-31: 100 mg via ORAL
  Filled 2018-05-30 (×2): qty 1

## 2018-05-30 MED ORDER — ONDANSETRON HCL 4 MG/2ML IJ SOLN
4.0000 mg | Freq: Once | INTRAMUSCULAR | Status: AC | PRN
  Administered 2018-05-30: 4 mg via INTRAVENOUS
  Filled 2018-05-30: qty 2

## 2018-05-30 MED ORDER — ACETAMINOPHEN 650 MG RE SUPP: 650.0000 mg | Freq: Four times a day (QID) | RECTAL | Status: DC | PRN

## 2018-05-30 MED ORDER — FLECAINIDE ACETATE 100 MG PO TABS
100.0000 mg | ORAL_TABLET | Freq: Once | ORAL | Status: AC
  Administered 2018-05-30: 100 mg via ORAL
  Filled 2018-05-30 (×2): qty 1

## 2018-05-30 MED ORDER — METOPROLOL TARTRATE 25 MG PO TABS
25.0000 mg | ORAL_TABLET | Freq: Two times a day (BID) | ORAL | Status: DC
  Administered 2018-05-31: 25 mg via ORAL
  Filled 2018-05-30: qty 1

## 2018-05-30 MED ORDER — ONDANSETRON HCL 4 MG/2ML IJ SOLN
4.0000 mg | Freq: Four times a day (QID) | INTRAMUSCULAR | Status: DC | PRN
  Administered 2018-05-31: 4 mg via INTRAVENOUS
  Filled 2018-05-30: qty 2

## 2018-05-30 MED ORDER — SODIUM CHLORIDE 0.9 % IV BOLUS
1000.0000 mL | Freq: Once | INTRAVENOUS | Status: AC
  Administered 2018-05-30: 1000 mL via INTRAVENOUS

## 2018-05-30 MED ORDER — BENZONATATE 100 MG PO CAPS
200.0000 mg | ORAL_CAPSULE | Freq: Three times a day (TID) | ORAL | Status: DC | PRN
  Administered 2018-05-31: 200 mg via ORAL
  Filled 2018-05-30: qty 2

## 2018-05-30 MED ORDER — ONDANSETRON HCL 4 MG/2ML IJ SOLN
4.0000 mg | Freq: Once | INTRAMUSCULAR | Status: AC
  Administered 2018-05-30: 4 mg via INTRAVENOUS
  Filled 2018-05-30: qty 2

## 2018-05-30 MED ORDER — GUAIFENESIN-DM 100-10 MG/5ML PO SYRP
5.0000 mL | ORAL_SOLUTION | ORAL | Status: DC | PRN
  Administered 2018-05-31: 5 mL via ORAL
  Filled 2018-05-30: qty 5

## 2018-05-30 MED ORDER — ENOXAPARIN SODIUM 40 MG/0.4ML ~~LOC~~ SOLN: 40.0000 mg | SUBCUTANEOUS | Status: DC

## 2018-05-30 MED ORDER — ONDANSETRON HCL 4 MG PO TABS: 4.0000 mg | ORAL_TABLET | Freq: Four times a day (QID) | ORAL | Status: DC | PRN

## 2018-05-30 MED ORDER — SODIUM CHLORIDE 0.9 % IV SOLN
INTRAVENOUS | Status: AC
  Administered 2018-05-30: 23:00:00 via INTRAVENOUS

## 2018-05-30 MED ORDER — METOPROLOL TARTRATE 25 MG PO TABS
25.0000 mg | ORAL_TABLET | Freq: Once | ORAL | Status: AC
  Administered 2018-05-30: 25 mg via ORAL
  Filled 2018-05-30: qty 1

## 2018-05-30 MED ORDER — ACETAMINOPHEN 325 MG PO TABS
650.0000 mg | ORAL_TABLET | Freq: Once | ORAL | Status: AC
  Administered 2018-05-30: 650 mg via ORAL
  Filled 2018-05-30: qty 2

## 2018-05-30 NOTE — ED Triage Notes (Signed)
Pt has had nausea and vomiting since Monday and unable to keep anything down. Friend was sick that Monday night but unsure what he had. Unsure if exposed to flu.  Generalized body aches.  Diarrhea yesterday. No abdominal pain. Vomiting in triage.  Has had fevers. Took tylenol this morning but none since.  Pt mildly pale.  Other than fever VSS at this time.

## 2018-05-30 NOTE — ED Notes (Signed)
Pt to radiology.

## 2018-05-30 NOTE — H&P (Signed)
[facility] [facility] [place] NAME: Keith Torres    MR#:  [id number]  DATE OF BIRTH:  1965/06/22  DATE OF ADMISSION:  05/30/2018  PRIMARY CARE PHYSICIAN: Valera Castle, MD   REQUESTING/REFERRING PHYSICIAN: Cuthriell, PA  CHIEF COMPLAINT:   Chief Complaint  Patient presents with  . Emesis    HISTORY OF PRESENT ILLNESS:  Keith Torres  is a 51 y.o. male who presents with chief complaint as above.  Patient presents with 4 days of cough, nausea vomiting, malaise.  Patient is also had some myalgias.  He states the symptoms have been getting worse.  For the last several days he has been unable to keep his medications down.  Here in the ED he tested flu positive.  Hospitalist were called for admission  PAST MEDICAL HISTORY:   Past Medical History:  Diagnosis Date  . Anxiety   . Atrial fibrillation ([place])   . Dental crowns present    FRONT TWO TEETH  . Deviated septum   . Dysrhythmia    A-FIB/ DR Nehemiah Massed  . Hyperlipidemia   . Hypertrophy of nasal turbinates   . Meningitis   . Obesity   . Rhinitis, allergic   . Seasonal allergies   . Sleep apnea    C-PAP     PAST SURGICAL HISTORY:   Past Surgical History:  Procedure Laterality Date  . APPENDECTOMY  1974  . BACK SURGERY  2015  . COLONOSCOPY    . COLONOSCOPY WITH PROPOFOL N/A 10/08/2015   Procedure: COLONOSCOPY WITH PROPOFOL;  Surgeon: Manya Silvas, MD;  Location: [facility]COPY;  Service: Endoscopy;  Laterality: N/A;  . HERNIA REPAIR Right 01-17-01   Indirect hernia, Prolene hernia system.  Marland Kitchen HERNIA REPAIR Left 03/23/14   Left indirect inguinal hernia repair, large ultra Pro mesh  . NASAL TURBINATE REDUCTION Bilateral 05/21/2015   Procedure: TURBINATE REDUCTION/SUBMUCOSAL RESECTION;  Surgeon: Beverly Gust, MD;  Location: [place];  Service: ENT;  Laterality: Bilateral;  . SEPTOPLASTY N/A 05/21/2015   Procedure: SEPTOPLASTY;  Surgeon: Beverly Gust,  MD;  Location: [place];  Service: ENT;  Laterality: N/A;  C-PAP     SOCIAL HISTORY:   Social History   Tobacco Use  . Smoking status: Never Smoker  . Smokeless tobacco: Never Used  Substance Use Topics  . Alcohol use: No    Comment: occasionally     FAMILY HISTORY:   Family History  Problem Relation Age of Onset  . Cancer Mother        colon  . Heart disease Father   . Hyperlipidemia Father   . CAD Father      DRUG ALLERGIES:   Allergies  Allergen Reactions  . Azithromycin Other (See Comments)    Drug interacts with current daily medication.    MEDICATIONS AT HOME:   Prior to Admission medications   Medication Sig Start Date End Date Taking? Authorizing Provider  albuterol (PROVENTIL HFA;VENTOLIN HFA) 108 (90 Base) MCG/ACT inhaler  09/21/17   [provider]  benzonatate (TESSALON) 200 MG capsule Take 1 capsule (200 mg total) by mouth at bedtime as needed for cough. 10/26/17   Maximino Sarin, FNP  flecainide (TAMBOCOR) 100 MG tablet Take 100 mg by mouth 2 (two) times daily. AM AND PM    [provider]  fluticasone (FLONASE) 50 MCG/ACT nasal spray Place 2 sprays into both nostrils daily. 11/08/17   Maximino Sarin, FNP  metoprolol tartrate (LOPRESSOR)  25 MG tablet Take by mouth. 04/01/15   [provider]  Spacer/Aero Chamber Mouthpiece MISC One spacer to use with albuterol inhaler. 10/26/17   Maximino Sarin, FNP    REVIEW OF SYSTEMS:  Review of Systems  Constitutional: Positive for malaise/fatigue. Negative for chills, fever and weight loss.  HENT: Negative for ear pain, hearing loss and tinnitus.   Eyes: Negative for blurred vision, double vision, pain and redness.  Respiratory: Positive for cough. Negative for hemoptysis and shortness of breath.   Cardiovascular: Negative for chest pain, palpitations, orthopnea and leg swelling.  Gastrointestinal: Positive for nausea and vomiting. Negative for abdominal  pain, constipation and diarrhea.  Genitourinary: Negative for dysuria, frequency and hematuria.  Musculoskeletal: Positive for myalgias. Negative for back pain, joint pain and neck pain.  Skin:       No acne, rash, or lesions  Neurological: Negative for dizziness, tremors, focal weakness and weakness.  Endo/Heme/Allergies: Negative for polydipsia. Does not bruise/bleed easily.  Psychiatric/Behavioral: Negative for depression. The patient is not nervous/anxious and does not have insomnia.      VITAL SIGNS:   Vitals:   05/30/18 1634 05/30/18 1637 05/30/18 2018  BP: 129/83  126/77  Pulse: 89  (!) 142  Resp: 20  (!) 24  Temp: (!) 102.7 F (39.3 C)  (!) 101 F (38.3 C)  TempSrc: Oral  Oral  SpO2: 99%  99%  Height:  6\' 2"  (1.88 m)    Wt Readings from Last 3 Encounters:  10/08/15 108.9 kg  05/21/15 108.4 kg  03/30/14 106.1 kg    PHYSICAL EXAMINATION:  Physical Exam  Vitals reviewed. Constitutional: He is oriented to person, place, and time. He appears well-developed and well-nourished. No distress.  HENT:  Head: Normocephalic and atraumatic.  Dry mucous membranes  Eyes: Pupils are equal, round, and reactive to light. Conjunctivae and EOM are normal. No scleral icterus.  Neck: Normal range of motion. Neck supple. No JVD present. No thyromegaly present.  Cardiovascular: Intact distal pulses. Exam reveals no gallop and no friction rub.  No murmur heard. Irregular rhythm, tachycardic  Respiratory: Effort normal and breath sounds normal. No respiratory distress. He has no wheezes. He has no rales.  GI: Soft. Bowel sounds are normal. He exhibits no distension. There is no abdominal tenderness.  Musculoskeletal: Normal range of motion.        General: No edema.     Comments: No arthritis, no gout  Lymphadenopathy:    He has no cervical adenopathy.  Neurological: He is alert and oriented to person, place, and time. No cranial nerve deficit.  No dysarthria, no aphasia  Skin: Skin  is warm and dry. No rash noted. No erythema.  Psychiatric: He has a normal mood and affect. His behavior is normal. Judgment and thought content normal.    LABORATORY PANEL:   CBC Recent Labs  Lab 05/30/18 1644  WBC 5.6  HGB 17.1*  HCT 48.1  PLT 168   ------------------------------------------------------------------------------------------------------------------  Chemistries  Recent Labs  Lab 05/30/18 1644  NA 136  K 3.3*  CL 101  CO2 24  GLUCOSE 140*  BUN 15  CREATININE 1.20  CALCIUM 8.7*  AST 35  ALT 30  ALKPHOS 62  BILITOT 0.7   ------------------------------------------------------------------------------------------------------------------  Cardiac Enzymes No results for input(s): TROPONINI in the last 168 hours. ------------------------------------------------------------------------------------------------------------------  RADIOLOGY:  No results found.  EKG:   Orders placed or performed during the hospital encounter of 05/30/18  . ED EKG  . ED EKG  .  EKG 12-Lead  . EKG 12-Lead    IMPRESSION AND PLAN:  Principal Problem:   Influenza A -patient is outside the window for Tamiflu.  Will offer supportive treatment tonight with IV fluids, PRN antitussive and antiemetic Active Problems:   Anxiety -home dose anxiolytic   Obstructive apnea -CPAP nightly   Paroxysmal atrial fibrillation (HCC) -home dose rate controlling medications, patient was initially and A. fib with RVR, but after receiving his home dose medications his heart rate controlled  Chart review performed and case discussed with ED provider. Labs, imaging and/or ECG reviewed by provider and discussed with patient/family. Management plans discussed with the patient and/or family.  DVT PROPHYLAXIS: SubQ lovenox   GI PROPHYLAXIS:  None  ADMISSION STATUS: Observation  CODE STATUS: Full  TOTAL TIME TAKING CARE OF THIS PATIENT: 40 minutes.   Keith Torres Ambler 05/30/2018, 9:14  PM  [facility]  Office  [phone]  CC: Primary care physician; Valera Castle, MD  Note:  This document was prepared using Dragon voice recognition software and may include unintentional dictation errors.

## 2018-05-30 NOTE — ED Provider Notes (Signed)
[facility] Emergency Department Provider Note  ____________________________________________  Time seen: Approximately 6:46 PM  I have reviewed the triage vital signs and the nursing notes.   HISTORY  Chief Complaint Emesis    HPI Keith Even. is a 52 y.o. male who presents the emergency department complaining of fevers, chills, body aches, nausea, emesis, diarrhea.  Patient reports that he has had symptoms for 3 days.  He was exposed to somebody that was "sick" but is unsure what they were sick with.   Patient's main concern at this time as he is unable to keep fluids or solids down due to his nausea.  Up until the extreme nausea, patient is been trying Tylenol and Motrin for symptoms.  Patient denies any headache, neck pain or stiffness, chest pain, shortness of breath, abdominal pain.   Past Medical History:  Diagnosis Date  . Anxiety   . Atrial fibrillation ([place])   . Dental crowns present    FRONT TWO TEETH  . Deviated septum   . Dysrhythmia    A-FIB/ DR Nehemiah Massed  . Hyperlipidemia   . Hypertrophy of nasal turbinates   . Meningitis   . Obesity   . Rhinitis, allergic   . Seasonal allergies   . Sleep apnea    C-PAP    Patient Active Problem List   Diagnosis Date Noted  . Influenza A 05/30/2018  . Anxiety 03/16/2015  . Adiposity 03/16/2015  . Allergic rhinitis, seasonal 03/16/2015  . Obstructive apnea 11/04/2014  . MI (mitral incompetence) 07/09/2014  . Paroxysmal atrial fibrillation ([place]) 07/09/2014  . Breathlessness on exertion 07/06/2014  . 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 Hernia of abdominal cavity 03/06/2014    Past Surgical History:  Procedure Laterality Date  . APPENDECTOMY  1974  . BACK SURGERY  2015  . COLONOSCOPY    . COLONOSCOPY WITH PROPOFOL N/A 10/08/2015   Procedure: COLONOSCOPY WITH PROPOFOL;  Surgeon: Manya Silvas, MD;  Location: [facility] ENDOSCOPY;  Service: Endoscopy;  Laterality:  N/A;  . HERNIA REPAIR Right 01-17-01   Indirect hernia, Prolene hernia system.  Marland Kitchen HERNIA REPAIR Left 03/23/14   Left indirect inguinal hernia repair, large ultra Pro mesh  . NASAL TURBINATE REDUCTION Bilateral 05/21/2015   Procedure: TURBINATE REDUCTION/SUBMUCOSAL RESECTION;  Surgeon: Beverly Gust, MD;  Location: [place];  Service: ENT;  Laterality: Bilateral;  . SEPTOPLASTY N/A 05/21/2015   Procedure: SEPTOPLASTY;  Surgeon: Beverly Gust, MD;  Location: [place];  Service: ENT;  Laterality: N/A;  C-PAP    Prior to Admission medications   Medication Sig Start Date End Date Taking? Authorizing Provider  albuterol (PROVENTIL HFA;VENTOLIN HFA) 108 (90 Base) MCG/ACT inhaler  09/21/17   [provider]  benzonatate (TESSALON) 200 MG capsule Take 1 capsule (200 mg total) by mouth at bedtime as needed for cough. 10/26/17   Maximino Sarin, FNP  flecainide (TAMBOCOR) 100 MG tablet Take 100 mg by mouth 2 (two) times daily. AM AND PM    [provider]  fluticasone (FLONASE) 50 MCG/ACT nasal spray Place 2 sprays into both nostrils daily. 11/08/17   McManama, Franchot Mimes, FNP  metoprolol tartrate (LOPRESSOR) 25 MG tablet Take by mouth. 04/01/15   [provider]  Spacer/Aero Chamber Mouthpiece MISC One spacer to use with albuterol inhaler. 10/26/17   Maximino Sarin, FNP    Allergies Azithromycin  Family History  Problem Relation Age of Onset  . Cancer Mother  colon  . Heart disease Father   . Hyperlipidemia Father   . CAD Father     Social History Social History   Tobacco Use  . Smoking status: Never Smoker  . Smokeless tobacco: Never Used  Substance Use Topics  . Alcohol use: No    Comment: occasionally  . Drug use: No     Review of Systems  Constitutional: Positive fever/chills.  Positive for generalized body aches. Eyes: No visual changes. No discharge ENT: No upper respiratory complaints. Cardiovascular: no  chest pain. Respiratory: no cough. No SOB. Gastrointestinal: No abdominal pain.  Positive for nausea and emesis.  Positive for diarrhea.  No constipation..   Genitourinary: Negative for dysuria. No hematuria Musculoskeletal: Negative for musculoskeletal pain. Skin: Negative for rash, abrasions, lacerations, ecchymosis. Neurological: Negative for headaches, focal weakness or numbness. 10-point ROS otherwise negative.  ____________________________________________   PHYSICAL EXAM:  VITAL SIGNS: ED Triage Vitals  Enc Vitals Group     BP 05/30/18 1634 129/83     Pulse Rate 05/30/18 1634 89     Resp 05/30/18 1634 20     Temp 05/30/18 1634 (!) 102.7 F (39.3 C)     Temp Source 05/30/18 1634 Oral     SpO2 05/30/18 1634 99 %     Weight --      Height 05/30/18 1637 6\' 2"  (1.88 m)     Head Circumference --      Peak Flow --      Pain Score 05/30/18 1636 7     Pain Loc --      Pain Edu? --      Excl. in [place]? --      Constitutional: Alert and oriented.  Moderately ill appearing but in no acute distress. Eyes: Conjunctivae are normal. PERRL. EOMI. Head: Atraumatic. ENT:      Ears: EACs and TMs unremarkable bilaterally.      Nose: Moderate clear congestion/rhinnorhea.      Mouth/Throat: Mucous membranes are moist.  Neck: No stridor.  Neck is supple full range of motion Hematological/Lymphatic/Immunilogical: Diffuse, mobile, nontender anterior cervical lymphadenopathy. Cardiovascular: Normal rate, regular rhythm. Normal S1 and S2.  Good peripheral circulation. Respiratory: Normal respiratory effort without tachypnea or retractions. Lungs CTAB. Good air entry to the bases with no decreased or absent breath sounds. Gastrointestinal: Hyperactive bowel sounds 4 quadrants. Soft and nontender to palpation. No guarding or rigidity. No palpable masses. No distention. Musculoskeletal: Full range of motion to all extremities. No gross deformities appreciated. Neurologic:  Normal speech and  language. No gross focal neurologic deficits are appreciated.  Skin:  Skin is warm, dry and intact. No rash noted. Psychiatric: Mood and affect are normal. Speech and behavior are normal. Patient exhibits appropriate insight and judgement.   ____________________________________________   LABS (all labs ordered are listed, but only abnormal results are displayed)  Labs Reviewed  COMPREHENSIVE METABOLIC PANEL - Abnormal; Notable for the following components:      Result Value   Potassium 3.3 (*)    Glucose, Bld 140 (*)    Calcium 8.7 (*)    All other components within normal limits  CBC - Abnormal; Notable for the following components:   Hemoglobin 17.1 (*)    All other components within normal limits  INFLUENZA PANEL BY PCR (TYPE A & B) - Abnormal; Notable for the following components:   Influenza A By PCR POSITIVE (*)    All other components within normal limits  LIPASE, BLOOD  URINALYSIS, COMPLETE (UACMP) WITH  MICROSCOPIC   ____________________________________________  EKG   ____________________________________________  RADIOLOGY   Dg Chest 2 View  Result Date: 05/30/2018 CLINICAL DATA:  Nausea vomiting for several days EXAM: CHEST - 2 VIEW COMPARISON:  11/08/2017 FINDINGS: Cardiac shadow is stable. Lungs are well aerated bilaterally. Old rib fractures are seen. Mild central vascular prominence is noted without significant edema. No acute bony abnormality is seen. IMPRESSION: Mild central vascular congestion without pulmonary edema. No focal infiltrate is seen. Electronically Signed   By: Inez Catalina M.D.   On: 05/30/2018 21:22    ____________________________________________    PROCEDURES  Procedure(s) performed:    Procedures    Medications  ondansetron [facility]) injection 4 mg (4 mg Intravenous Given 05/30/18 1648)  acetaminophen (TYLENOL) tablet 650 mg (650 mg Oral Given 05/30/18 1648)  sodium chloride 0.9 % bolus 1,000 mL (0 mLs Intravenous Stopped  05/30/18 1900)  ondansetron (ZOFRAN) injection 4 mg (4 mg Intravenous Given 05/30/18 1949)  sodium chloride 0.9 % bolus 1,000 mL (0 mLs Intravenous Stopped 05/30/18 2044)  metoprolol tartrate (LOPRESSOR) tablet 25 mg (25 mg Oral Given 05/30/18 2029)  flecainide (TAMBOCOR) tablet 100 mg (100 mg Oral Given 05/30/18 2053)     ____________________________________________   INITIAL IMPRESSION / ASSESSMENT AND PLAN / ED COURSE  Pertinent labs & imaging results that were available during my care of the patient were reviewed by me and considered in my medical decision making (see chart for details).  Review of the [place] CSRS was performed in accordance of the [place] prior to dispensing any controlled drugs.  Clinical Course as of May 31 2203  Thu May 30, 2018  2152 Patient presents emergency department with several day history of fevers and chills, body aches, nausea, uncontrolled emesis.  Patient was positive for the flu, had a hemoglobin of 17.1 which likely indicates dehydration as well as a mildly decreased potassium consistent with emesis.  While awaiting results of a fluid challenge here in the emergency department, patient's heart rate went from 90-1 60s.  On my evaluation, it appeared that patient had return to A. fib.  EKG confirms.  Given limited p.o. intake, influenza positive with ongoing symptoms and now A. fib, patient was admitted to the hospital.   [JC]    Clinical Course User Index [JC] Rielly Corlett, Charline Bills, PA-C     Patient's diagnosis is consistent with influenza, dehydration, A. fib.  Patient presents emergency department with 3-day history of symptoms.  While here, patient was able to drink approximately 8 ounces of fluid after 2 doses of Zofran, 2 L of fluid.  Patient did have findings consistent with dehydration with slightly elevated hemoglobin.  Patient does have a history of A. fib and while here he went into A. fib.  No chest pain or shortness of breath.  Given 3 days worth  of symptoms, dehydration, A. fib, patient was a good candidate for admission.  Patient care will be transferred to hospitalist service.      ____________________________________________  FINAL CLINICAL IMPRESSION(S) / ED DIAGNOSES  Final diagnoses:  Influenza A  Dehydration  Atrial fibrillation, unspecified type ([place])      NEW MEDICATIONS STARTED DURING THIS VISIT:  ED Discharge Orders    None          This chart was dictated using voice recognition software/Dragon. Despite best efforts to proofread, errors can occur which can change the meaning. Any change was purely unintentional.    Darletta Moll, PA-C 05/30/18 [place],  Doren Custard, MD 05/30/18 2324

## 2018-05-30 NOTE — ED Notes (Signed)
Pt has not voided yet.  Specimen cup given along with instructions for UA collection.  Pt verbalized understanding.

## 2018-05-30 NOTE — ED Notes (Signed)
Returned from radiology. 

## 2018-05-31 LAB — BASIC METABOLIC PANEL
Anion gap: 8 (ref 5–15)
BUN: 15 mg/dL (ref 6–20)
CO2: 24 mmol/L (ref 22–32)
Calcium: 7.6 mg/dL — ABNORMAL LOW (ref 8.9–10.3)
Chloride: 104 mmol/L (ref 98–111)
Creatinine, Ser: 0.91 mg/dL (ref 0.61–1.24)
GFR calc Af Amer: >60 mL/min (ref >60)
GFR calc non Af Amer: >60 mL/min (ref >60)
Glucose, Bld: 111 mg/dL — ABNORMAL HIGH (ref 70–99)
POTASSIUM: 3.2 mmol/L — AB (ref 3.5–5.1)
Sodium: 136 mmol/L (ref 135–145)

## 2018-05-31 LAB — CBC
HCT: 41.2 % (ref 39.0–52.0)
Hemoglobin: 14.4 g/dL (ref 13.0–17.0)
MCH: 31.2 pg (ref 26.0–34.0)
MCHC: 35 g/dL (ref 30.0–36.0)
MCV: 89.2 fL (ref 80.0–100.0)
Platelets: 143 10*3/uL — ABNORMAL LOW (ref 150–400)
RBC: 4.62 MIL/uL (ref 4.22–5.81)
RDW: 11.9 % (ref 11.5–15.5)
WBC: 3.4 10*3/uL — ABNORMAL LOW (ref 4.0–10.5)
nRBC: 0 % (ref 0.0–0.2)

## 2018-05-31 MED ORDER — SODIUM CHLORIDE 0.9 % IV BOLUS
500.0000 mL | Freq: Once | INTRAVENOUS | Status: AC
  Administered 2018-05-31: 500 mL via INTRAVENOUS

## 2018-05-31 MED ORDER — OSELTAMIVIR PHOSPHATE 75 MG PO CAPS: 75.0000 mg | ORAL_CAPSULE | Freq: Two times a day (BID) | ORAL | 0 refills | Status: AC

## 2018-05-31 MED ORDER — OSELTAMIVIR PHOSPHATE 75 MG PO CAPS
75.0000 mg | ORAL_CAPSULE | Freq: Two times a day (BID) | ORAL | Status: DC
  Administered 2018-05-31: 75 mg via ORAL
  Filled 2018-05-31 (×2): qty 1

## 2018-05-31 MED ORDER — METOPROLOL TARTRATE 25 MG PO TABS
25.0000 mg | ORAL_TABLET | Freq: Once | ORAL | Status: AC
  Administered 2018-05-31: 25 mg via ORAL
  Filled 2018-05-31: qty 1

## 2018-05-31 MED ORDER — POTASSIUM CHLORIDE CRYS ER 20 MEQ PO TBCR
40.0000 meq | EXTENDED_RELEASE_TABLET | Freq: Once | ORAL | Status: AC
  Administered 2018-05-31: 40 meq via ORAL
  Filled 2018-05-31: qty 2

## 2018-05-31 NOTE — Progress Notes (Signed)
Pt's HR continues to sustain anywhere from 120's-130's afib, pt was afib RVR in ED but was given his home metoprolol & tambocor & HR controlled. MD paged, Dr. Jannifer Franklin put in orders for 25mg  PO metoprolol. Will give and continue to monitor. Conley Simmonds, RN, BSN

## 2018-05-31 NOTE — Progress Notes (Signed)
Dr. Benjie Karvonen notified of low bp 94/71, pulse 94, Received verbal orders for 500 cc bolus. Will administer and continue to monitor.

## 2018-05-31 NOTE — Plan of Care (Signed)
  Problem: Clinical Measurements: Goal: Respiratory complications will improve Outcome: Progressing Note:  On room air, CPAP at bedtime, received zofran once for nausea, getting IV fluids, independent in room   Problem: Pain Managment: Goal: General experience of comfort will improve Outcome: Progressing Note:  No complaints of pain this shift   Problem: Education: Goal: Knowledge of General Education information will improve Description Including pain rating scale, medication(s)/side effects and non-pharmacologic comfort measures Outcome: Completed/Met

## 2018-05-31 NOTE — Progress Notes (Signed)
Keith Heman. to be D/C'd Home per MD order.  Discussed prescriptions and follow up appointments with the patient. Prescriptions given to patient, medication list explained in detail. Pt verbalized understanding.  Allergies as of 05/31/2018      Reactions   Azithromycin Other (See Comments)   Drug interacts with current daily medication.      Medication List    TAKE these medications   albuterol 108 (90 Base) MCG/ACT inhaler Commonly known as:  PROVENTIL HFA;VENTOLIN HFA   benzonatate 200 MG capsule Commonly known as:  TESSALON Take 1 capsule (200 mg total) by mouth at bedtime as needed for cough.   flecainide 100 MG tablet Commonly known as:  TAMBOCOR Take 100 mg by mouth 2 (two) times daily. AM AND PM   fluticasone 50 MCG/ACT nasal spray Commonly known as:  FLONASE Place 2 sprays into both nostrils daily.   metoprolol tartrate 25 MG tablet Commonly known as:  LOPRESSOR Take by mouth.   oseltamivir 75 MG capsule Commonly known as:  TAMIFLU Take 1 capsule (75 mg total) by mouth 2 (two) times daily for 5 days.   Spacer/Aero Chamber Mouthpiece Misc One spacer to use with albuterol inhaler.       Vitals:   05/31/18 1232 05/31/18 1357  BP: 94/71 97/66  Pulse: 94 91  Resp:  18  Temp:    SpO2:  96%    Tele box removed and returned. Skin clean, dry and intact without evidence of skin break down, no evidence of skin tears noted. IV catheter discontinued intact. Site without signs and symptoms of complications. Dressing and pressure applied. Pt denies pain at this time. No complaints noted.  An After Visit Summary was printed and given to the patient. Patient escorted via [place], and D/C home via private auto.  Keith Torres

## 2018-05-31 NOTE — Progress Notes (Addendum)
Pt's Bp is 96/74, Afib HR 120's-150's, Per Dr. Benjie Karvonen ok to give Metoprolol 25 mg due this morning. Will continue to monitor.

## 2018-05-31 NOTE — Discharge Summary (Signed)
[place] [facility] [place] NAME: Keith Torres    MR#:  [id number]  DATE OF BIRTH:  02-14-1966  DATE OF ADMISSION:  05/30/2018 ADMITTING PHYSICIAN: Lance Coon, MD  DATE OF DISCHARGE: 05/31/2018  PRIMARY CARE PHYSICIAN: Valera Castle, MD    ADMISSION DIAGNOSIS:  Dehydration [E86.0] Influenza A [J10.1] Atrial fibrillation, unspecified type ([place]) [I48.91]  DISCHARGE DIAGNOSIS:  Principal Problem:   Influenza A Active Problems:   Anxiety   Obstructive apnea   Paroxysmal atrial fibrillation ([place])   SECONDARY DIAGNOSIS:   Past Medical History:  Diagnosis Date  . Anxiety   . Atrial fibrillation ([place])   . Dental crowns present    FRONT TWO TEETH  . Deviated septum   . Dysrhythmia    A-FIB/ DR Nehemiah Massed  . Hyperlipidemia   . Hypertrophy of nasal turbinates   . Meningitis   . Obesity   . Rhinitis, allergic   . Seasonal allergies   . Sleep apnea    C-PAP    HOSPITAL COURSE:  52 year old male with a history of atrial fibrillation who presented to the ER due to generalized weakness and poor p.o. intake.  1.  Influenza A: Pharmacy has recommended Tamiflu and therefore patient will be on 5 days of Tamiflu.  2.  Generalized weakness due to influenza A: Patient has improved symptoms.  3.  PAF: Heart rate was slightly elevated however after restarting his home medications heart rate is better controlled.  4.  OSA: Patient is on CPAP    DISCHARGE CONDITIONS AND DIET:   Stable for discharge on heart healthy diet  CONSULTS OBTAINED:    DRUG ALLERGIES:   Allergies  Allergen Reactions  . Azithromycin Other (See Comments)    Drug interacts with current daily medication.    DISCHARGE MEDICATIONS:   Allergies as of 05/31/2018      Reactions   Azithromycin Other (See Comments)   Drug interacts with current daily medication.      Medication List    TAKE these medications   albuterol 108 (90 Base) MCG/ACT  inhaler Commonly known as:  PROVENTIL HFA;VENTOLIN HFA   benzonatate 200 MG capsule Commonly known as:  TESSALON Take 1 capsule (200 mg total) by mouth at bedtime as needed for cough.   flecainide 100 MG tablet Commonly known as:  TAMBOCOR Take 100 mg by mouth 2 (two) times daily. AM AND PM   fluticasone 50 MCG/ACT nasal spray Commonly known as:  FLONASE Place 2 sprays into both nostrils daily.   metoprolol tartrate 25 MG tablet Commonly known as:  LOPRESSOR Take by mouth.   oseltamivir 75 MG capsule Commonly known as:  TAMIFLU Take 1 capsule (75 mg total) by mouth 2 (two) times daily for 5 days.   Spacer/Aero Chamber Mouthpiece Misc One spacer to use with albuterol inhaler.         Today   CHIEF COMPLAINT:   Patient tolerating diet and doing much better this morning.  Was unable to take his medications for several days due to nausea.   VITAL SIGNS:  Blood pressure (!) 81/65, pulse 90, temperature 98.6 F (37 C), temperature source Oral, resp. rate 18, height 6\' 2"  (1.88 m), weight 109.2 kg, SpO2 96 %.   REVIEW OF SYSTEMS:  Review of Systems  Constitutional: Negative.  Negative for chills, fever and malaise/fatigue.  HENT: Negative.  Negative for ear discharge, ear pain, hearing loss, nosebleeds and sore throat.   Eyes: Negative.  Negative  for blurred vision and pain.  Respiratory: Negative.  Negative for cough, hemoptysis, shortness of breath and wheezing.   Cardiovascular: Negative.  Negative for chest pain, palpitations and leg swelling.  Gastrointestinal: Negative.  Negative for abdominal pain, blood in stool, diarrhea, nausea and vomiting.  Genitourinary: Negative.  Negative for dysuria.  Musculoskeletal: Negative.  Negative for back pain.  Skin: Negative.   Neurological: Negative for dizziness, tremors, speech change, focal weakness, seizures and headaches.  Endo/Heme/Allergies: Negative.  Does not bruise/bleed easily.  Psychiatric/Behavioral: Negative.   Negative for depression, hallucinations and suicidal ideas.     PHYSICAL EXAMINATION:  GENERAL:  52 y.o.-year-old patient lying in the bed with no acute distress.  NECK:  Supple, no jugular venous distention. No thyroid enlargement, no tenderness.  LUNGS: Normal breath sounds bilaterally, no wheezing, rales,rhonchi  No use of accessory muscles of respiration.  CARDIOVASCULAR: irr, irr No murmurs, rubs, or gallops.  ABDOMEN: Soft, non-tender, non-distended. Bowel sounds present. No organomegaly or mass.  EXTREMITIES: No pedal edema, cyanosis, or clubbing.  PSYCHIATRIC: The patient is alert and oriented x 3.  SKIN: No obvious rash, lesion, or ulcer.   DATA REVIEW:   CBC Recent Labs  Lab 05/31/18 0632  WBC 3.4*  HGB 14.4  HCT 41.2  PLT 143*    Chemistries  Recent Labs  Lab 05/30/18 1644 05/31/18 0632  NA 136 136  K 3.3* 3.2*  CL 101 104  CO2 24 24  GLUCOSE 140* 111*  BUN 15 15  CREATININE 1.20 0.91  CALCIUM 8.7* 7.6*  AST 35  --   ALT 30  --   ALKPHOS 62  --   BILITOT 0.7  --     Cardiac Enzymes No results for input(s): TROPONINI in the last 168 hours.  Microbiology Results  @[email or web address] @  RADIOLOGY:  Dg Chest 2 View  Result Date: 05/30/2018 CLINICAL DATA:  Nausea vomiting for several days EXAM: CHEST - 2 VIEW COMPARISON:  11/08/2017 FINDINGS: Cardiac shadow is stable. Lungs are well aerated bilaterally. Old rib fractures are seen. Mild central vascular prominence is noted without significant edema. No acute bony abnormality is seen. IMPRESSION: Mild central vascular congestion without pulmonary edema. No focal infiltrate is seen. Electronically Signed   By: Inez Catalina M.D.   On: 05/30/2018 21:22      Allergies as of 05/31/2018      Reactions   Azithromycin Other (See Comments)   Drug interacts with current daily medication.      Medication List    TAKE these medications   albuterol 108 (90 Base) MCG/ACT inhaler Commonly known as:  PROVENTIL  HFA;VENTOLIN HFA   benzonatate 200 MG capsule Commonly known as:  TESSALON Take 1 capsule (200 mg total) by mouth at bedtime as needed for cough.   flecainide 100 MG tablet Commonly known as:  TAMBOCOR Take 100 mg by mouth 2 (two) times daily. AM AND PM   fluticasone 50 MCG/ACT nasal spray Commonly known as:  FLONASE Place 2 sprays into both nostrils daily.   metoprolol tartrate 25 MG tablet Commonly known as:  LOPRESSOR Take by mouth.   oseltamivir 75 MG capsule Commonly known as:  TAMIFLU Take 1 capsule (75 mg total) by mouth 2 (two) times daily for 5 days.   Spacer/Aero Chamber Mouthpiece Misc One spacer to use with albuterol inhaler.         Management plans discussed with the patient and he is in agreement. Stable for discharge   Patient should follow up  with pcp  CODE STATUS:     Code Status Orders  (From admission, onward)         Start     Ordered   05/30/18 2255  Full code  Continuous     05/30/18 2255        Code Status History    This patient has a current code status but no historical code status.      TOTAL TIME TAKING CARE OF THIS PATIENT: 38 minutes.    Note: This dictation was prepared with Dragon dictation along with smaller phrase technology. Any transcriptional errors that result from this process are unintentional.  Amilee Janvier M.D on 05/31/2018 at 11:35 AM  Between 7am to 6pm - Pager - [phone] After 6pm go to [email or web address] - password [email or web address] [facility]  Office  [phone]  CC: Primary care physician; Valera Castle, MD

## 2018-06-01 LAB — HIV ANTIBODY (ROUTINE TESTING W REFLEX): HIV Screen 4th Generation wRfx: NONREACTIVE

## 2019-10-10 IMAGING — CT CT NECK W/ CM
2 of 3 series · 8 of 14 positions shown, 10 images · IV contrast (iopamidol)
Comparison: None.

CLINICAL DATA: 51 y/o  M; chronic cough.

EXAM:
CT NECK WITH CONTRAST
TECHNIQUE: Multidetector CT imaging of the neck was performed using the
standard protocol following the bolus administration of intravenous
contrast.
CONTRAST:  75mL [id number] IOPAMIDOL ([id number]) INJECTION 61%

[Series 2: axial neck · axial · 0.54mm/px · z∈[-230,-90]mm · 3 of 142 slices shown]
[im 36/142  bone]
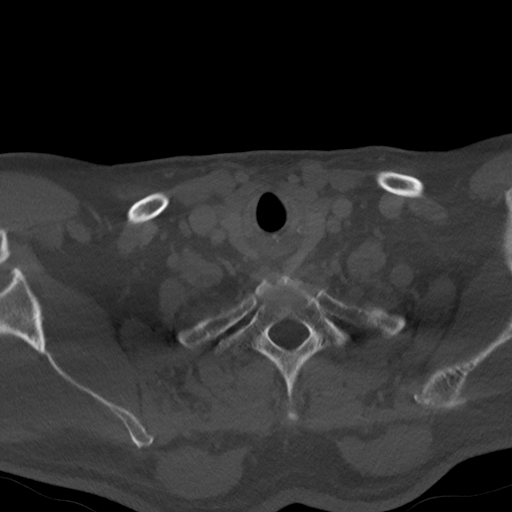
[im 71/142  bone]
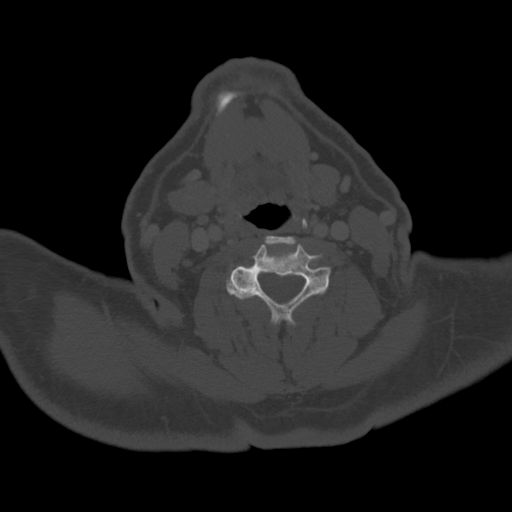
[im 106/142  bone]
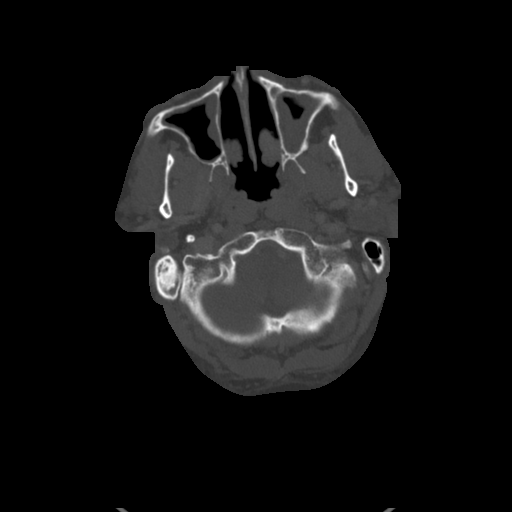

[Series 7: orthogonal ax · axial · 0.50mm/px · z∈[-297,-83]mm · 5 of 167 slices shown, 7 images]
[im 28/167  soft-tissue]
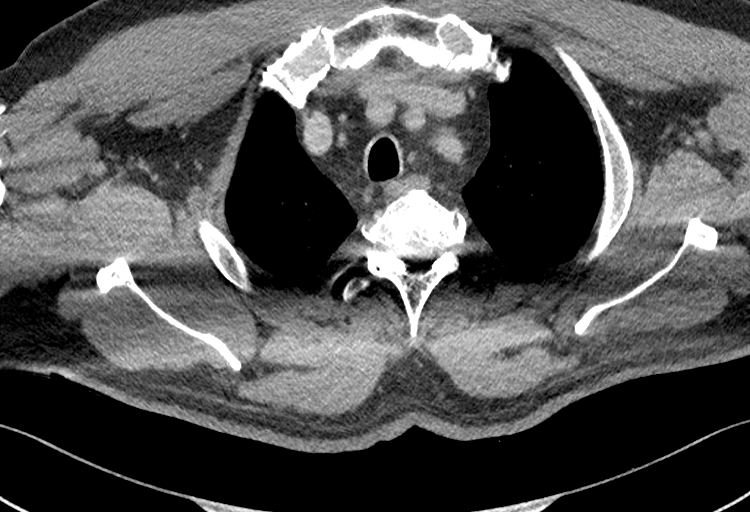
[im 28/167  bone]
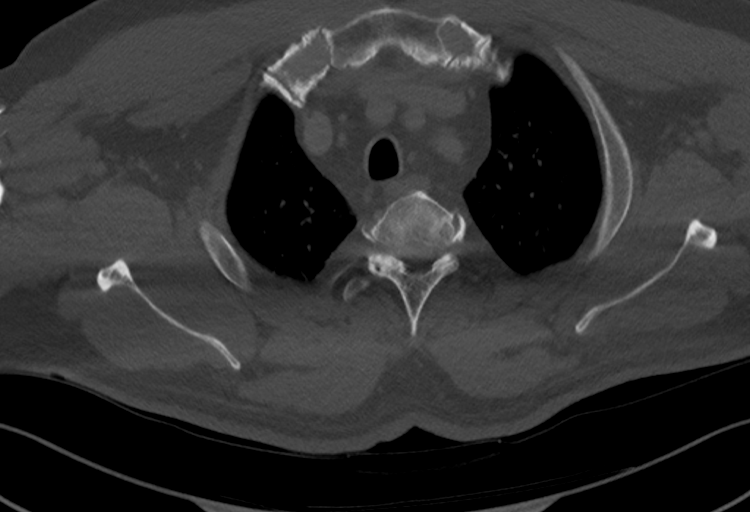
[im 56/167  bone]
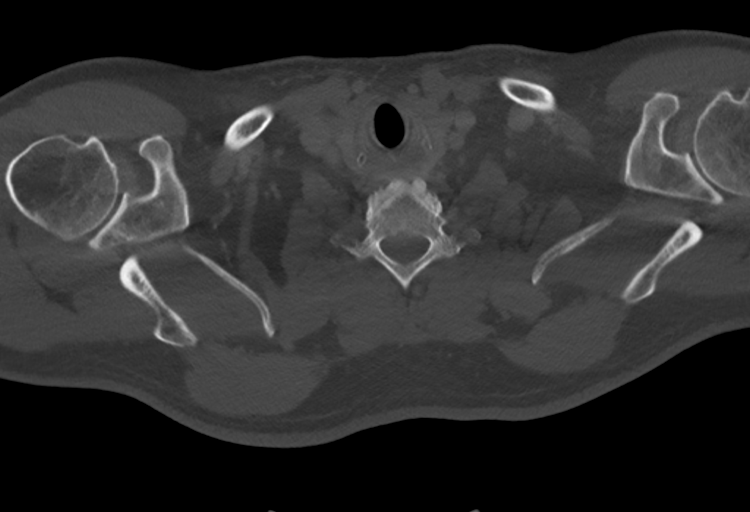
[im 84/167  bone]
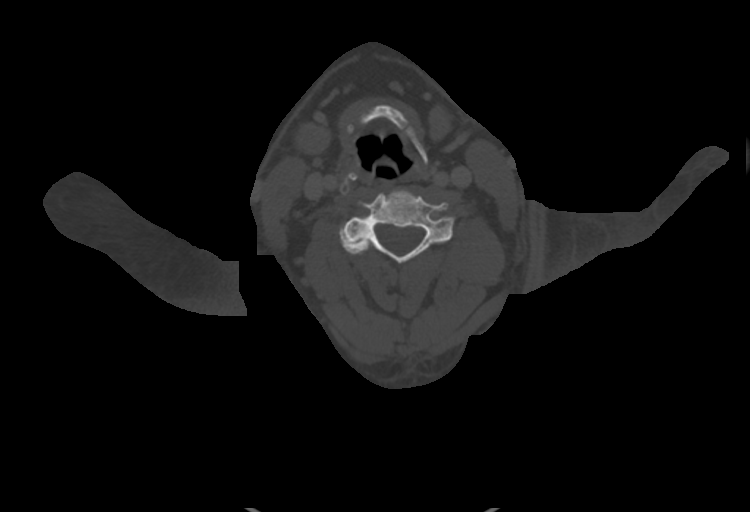
[im 111/167  bone]
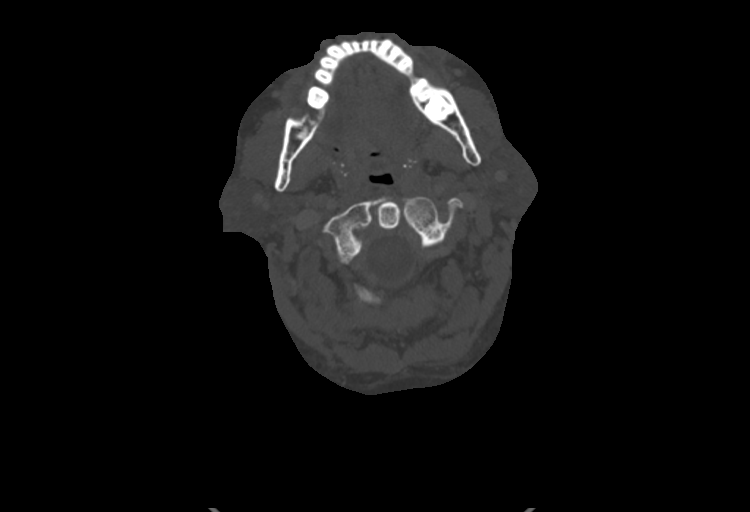
[im 139/167  soft-tissue]
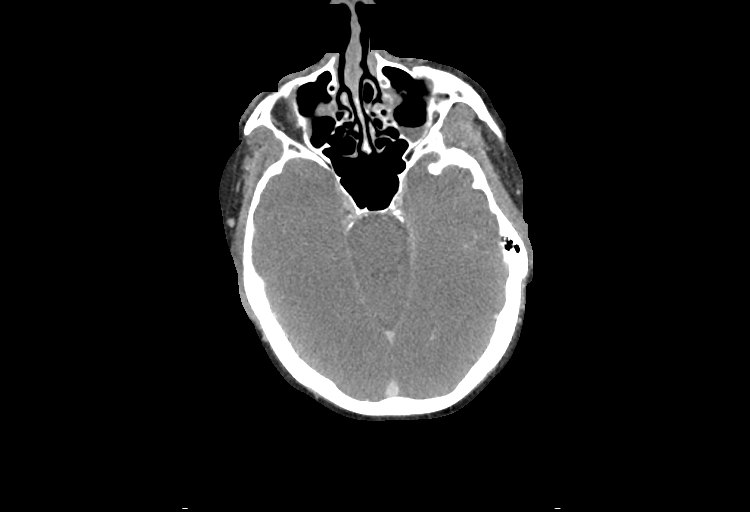
[im 139/167  bone]
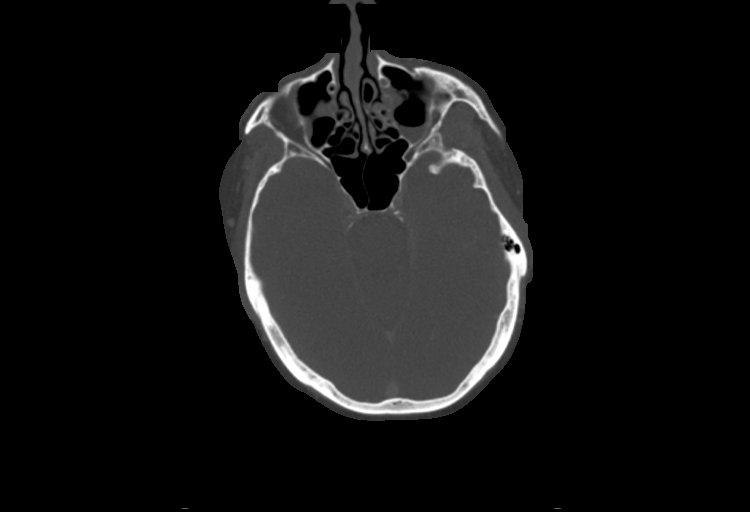

[8 of 14 positions shown; findings below may reference images not displayed]

FINDINGS: Pharynx and larynx: Normal. No mass or swelling. The airway
displaces mildly rightward at the level of the thoracic inlet but
there is no compression of the airway or mass lesion identified,
likely normal variant.

Salivary glands: No inflammation, mass, or stone.

Thyroid: Normal.

Lymph nodes: None enlarged or abnormal density.

Vascular: Negative.

Limited intracranial: Negative.

Visualized orbits: Negative.

Mastoids and visualized paranasal sinuses: Opacification of the
right mastoid tip with sclerosis compatible sequelae of chronic
otomastoiditis. Mild ethmoid and moderate maxillary sinus mucosal
thickening. Left maxillary sinus fluid level.

Skeleton: Cervical spondylosis with discogenic degenerative changes
at the C4-C7 levels with there is loss of disc space height and
marginal osteophytes.

Upper chest: Negative.

Other: None.
IMPRESSION: 1. The airway displaces mildly rightward at the level of the
thoracic inlet but there is no compression of the airway or mass
lesion identified, likely normal variant.
2. Paranasal sinus disease with a small left maxillary sinus fluid
level which may represent acute sinusitis.

By: Obdulia Farfan M.D.

## 2020-04-19 IMAGING — CR DG CHEST 2V
2 series · 2 of 2 positions shown · non-contrast
Comparison: 11/08/2017

CLINICAL DATA: Nausea vomiting for several days

EXAM:
CHEST - 2 VIEW

[chest pa]
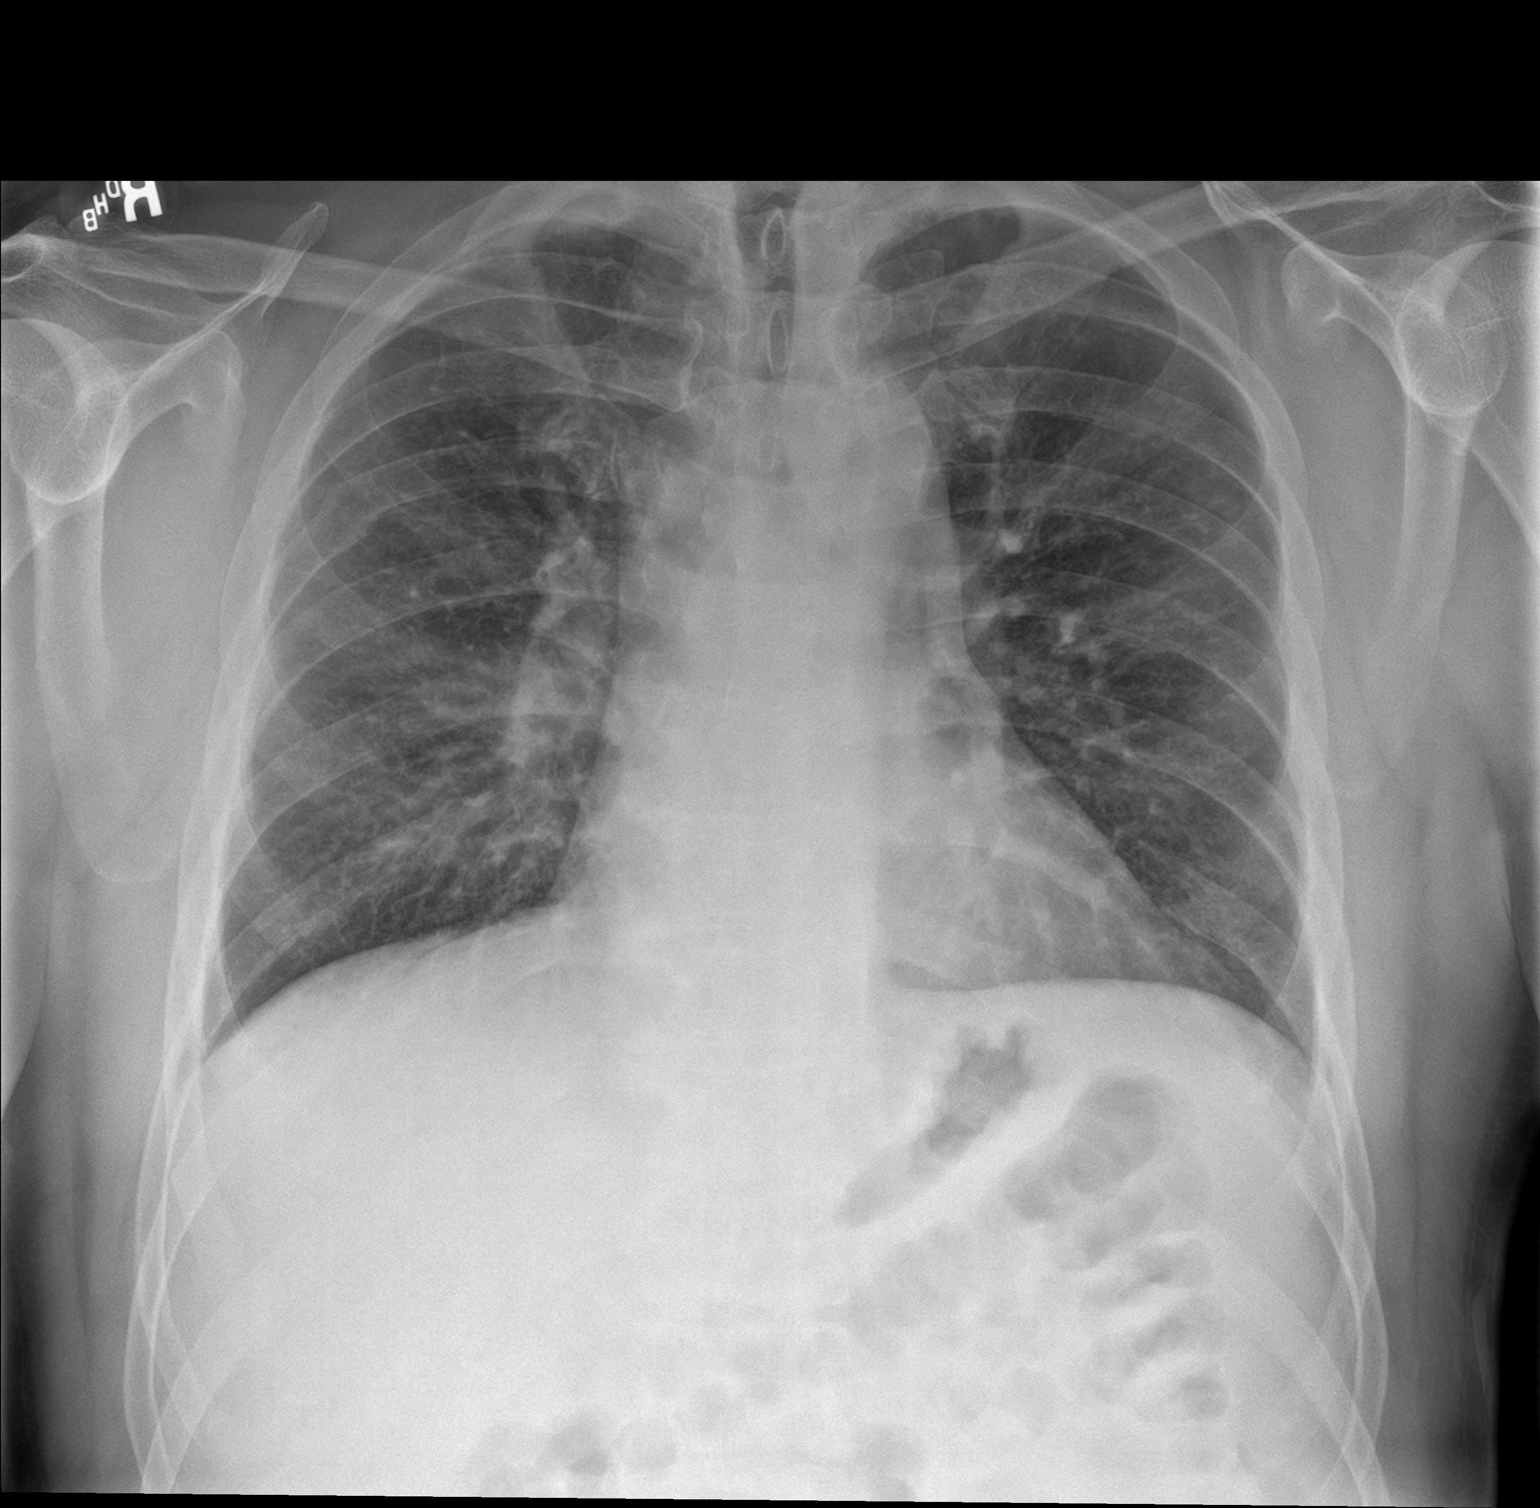

[chest lat]
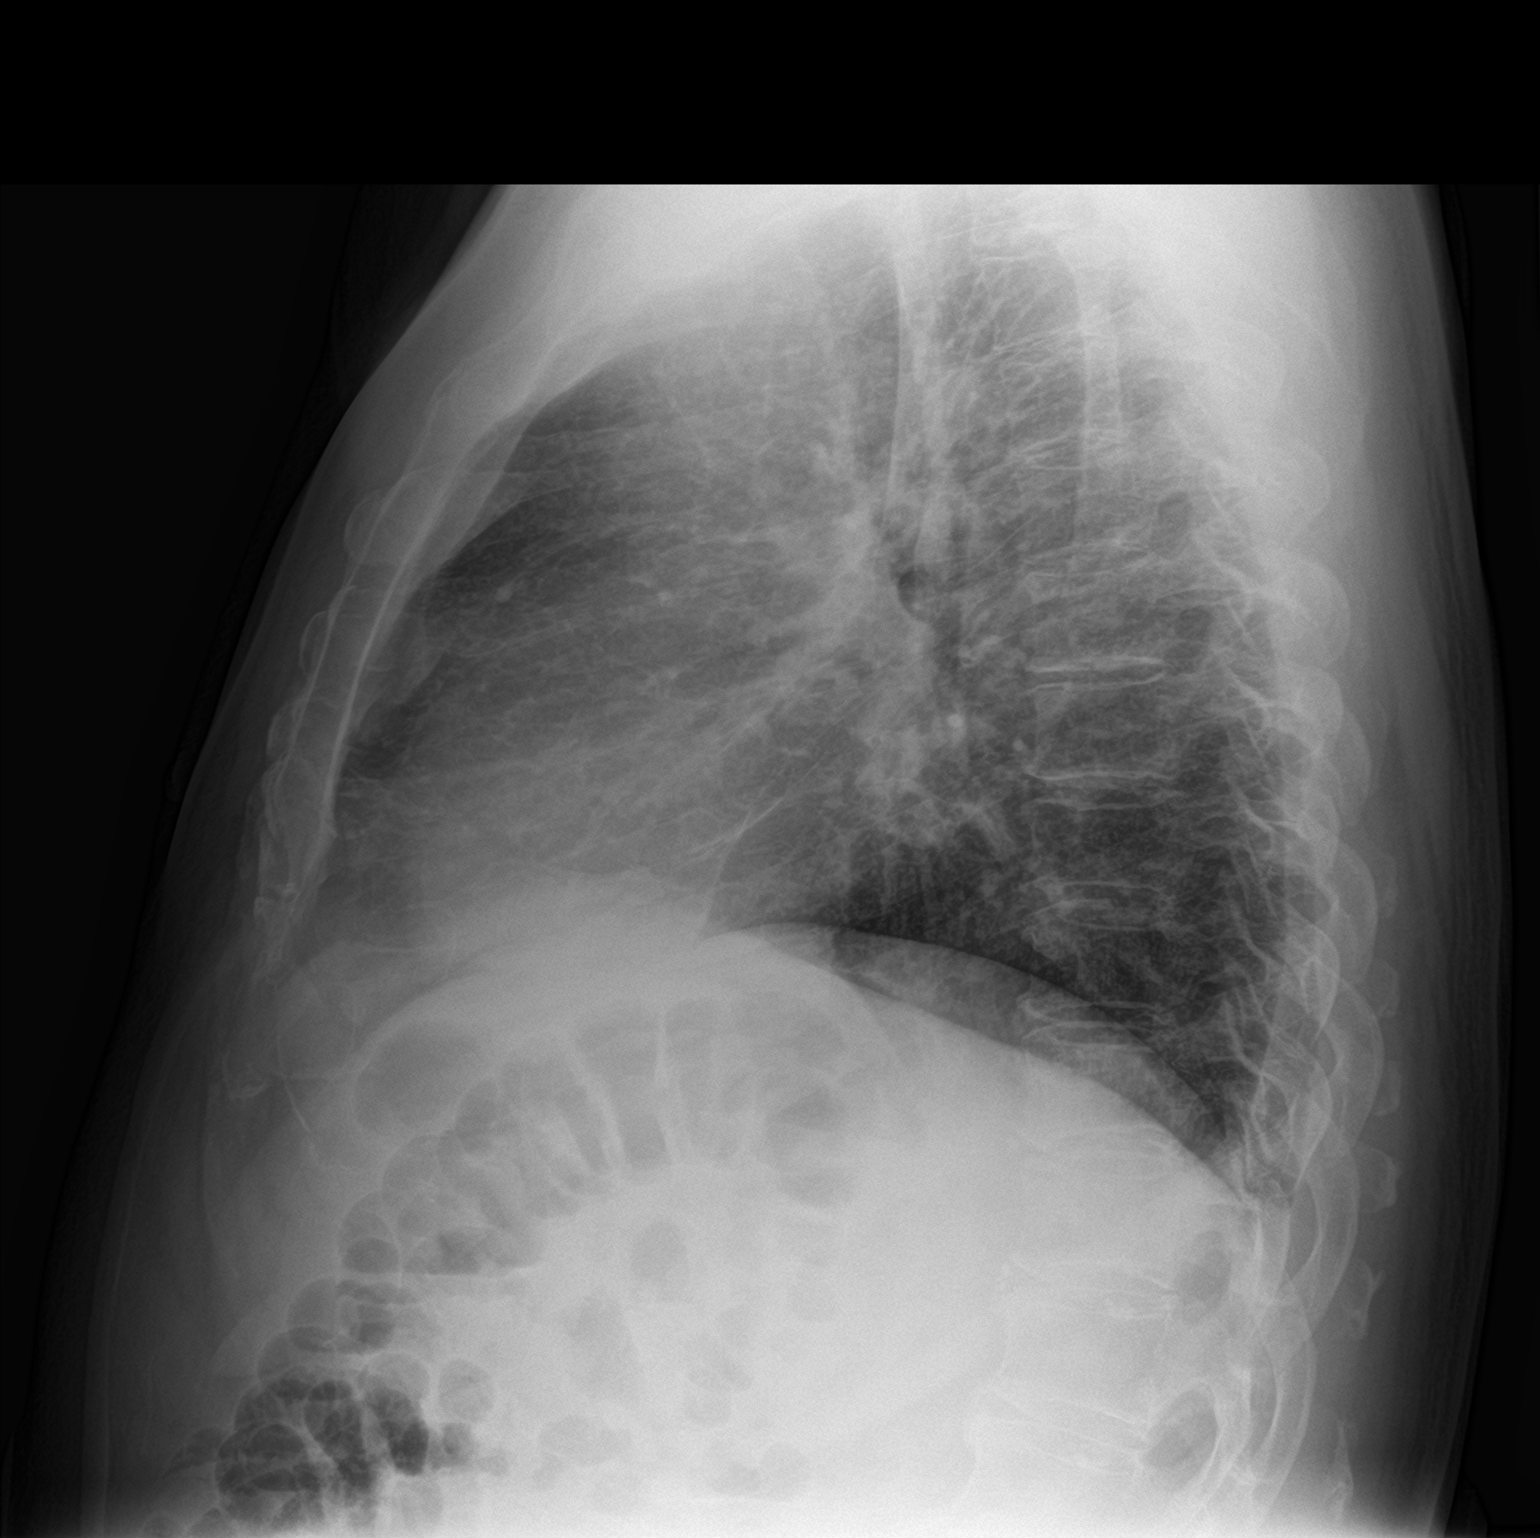

[2 of 2 positions shown; findings below may reference images not displayed]

FINDINGS: Cardiac shadow is stable. Lungs are well aerated bilaterally. Old
rib fractures are seen. Mild central vascular prominence is noted
without significant edema. No acute bony abnormality is seen.
IMPRESSION: Mild central vascular congestion without pulmonary edema.

No focal infiltrate is seen.

## 2023-01-12 ENCOUNTER — Inpatient Hospital Stay
Admission: EM | Admit: 2023-01-12 | Discharge: 2023-01-16 | DRG: 321 | Disposition: A | Discharge status: Home / Self Care | Payer: BC Managed Care – PPO | Source: Ambulatory Visit | Attending: Internal Medicine | Admitting: Internal Medicine

## 2023-01-12 ENCOUNTER — Encounter
Admission: EM | Disposition: A | Discharge status: Home / Self Care | Payer: Self-pay | Source: Home / Self Care | Attending: Internal Medicine

## 2023-01-12 ENCOUNTER — Emergency Department: Payer: BC Managed Care – PPO

## 2023-01-12 ENCOUNTER — Other Ambulatory Visit: Payer: Self-pay

## 2023-01-12 ENCOUNTER — Inpatient Hospital Stay (HOSPITAL_COMMUNITY)
Admit: 2023-01-12 | Discharge: 2023-01-12 | Disposition: A | Discharge status: Home / Self Care | Payer: BC Managed Care – PPO | Attending: Internal Medicine | Admitting: Internal Medicine

## 2023-01-12 ENCOUNTER — Other Ambulatory Visit (HOSPITAL_COMMUNITY): Payer: Self-pay

## 2023-01-12 DIAGNOSIS — F419 Anxiety disorder, unspecified: Secondary | ICD-10-CM | POA: Diagnosis present

## 2023-01-12 DIAGNOSIS — Z8661 Personal history of infections of the central nervous system: Secondary | ICD-10-CM

## 2023-01-12 DIAGNOSIS — Z8249 Family history of ischemic heart disease and other diseases of the circulatory system: Secondary | ICD-10-CM

## 2023-01-12 DIAGNOSIS — I2111 ST elevation (STEMI) myocardial infarction involving right coronary artery: Secondary | ICD-10-CM

## 2023-01-12 DIAGNOSIS — E669 Obesity, unspecified: Secondary | ICD-10-CM | POA: Diagnosis present

## 2023-01-12 DIAGNOSIS — I5033 Acute on chronic diastolic (congestive) heart failure: Secondary | ICD-10-CM | POA: Diagnosis not present

## 2023-01-12 DIAGNOSIS — E785 Hyperlipidemia, unspecified: Secondary | ICD-10-CM | POA: Diagnosis present

## 2023-01-12 DIAGNOSIS — I213 ST elevation (STEMI) myocardial infarction of unspecified site: Secondary | ICD-10-CM | POA: Diagnosis not present

## 2023-01-12 DIAGNOSIS — K5900 Constipation, unspecified: Secondary | ICD-10-CM | POA: Diagnosis not present

## 2023-01-12 DIAGNOSIS — Z9049 Acquired absence of other specified parts of digestive tract: Secondary | ICD-10-CM

## 2023-01-12 DIAGNOSIS — I110 Hypertensive heart disease with heart failure: Secondary | ICD-10-CM | POA: Diagnosis present

## 2023-01-12 DIAGNOSIS — I4891 Unspecified atrial fibrillation: Secondary | ICD-10-CM | POA: Diagnosis not present

## 2023-01-12 DIAGNOSIS — Z7902 Long term (current) use of antithrombotics/antiplatelets: Secondary | ICD-10-CM | POA: Diagnosis not present

## 2023-01-12 DIAGNOSIS — N179 Acute kidney failure, unspecified: Secondary | ICD-10-CM | POA: Diagnosis present

## 2023-01-12 DIAGNOSIS — R0789 Other chest pain: Secondary | ICD-10-CM | POA: Diagnosis present

## 2023-01-12 DIAGNOSIS — I440 Atrioventricular block, first degree: Secondary | ICD-10-CM | POA: Diagnosis present

## 2023-01-12 DIAGNOSIS — I480 Paroxysmal atrial fibrillation: Secondary | ICD-10-CM | POA: Diagnosis present

## 2023-01-12 DIAGNOSIS — I2510 Atherosclerotic heart disease of native coronary artery without angina pectoris: Secondary | ICD-10-CM | POA: Diagnosis present

## 2023-01-12 DIAGNOSIS — Z79899 Other long term (current) drug therapy: Secondary | ICD-10-CM | POA: Diagnosis not present

## 2023-01-12 DIAGNOSIS — Z6832 Body mass index (BMI) 32.0-32.9, adult: Secondary | ICD-10-CM | POA: Diagnosis not present

## 2023-01-12 DIAGNOSIS — Z87891 Personal history of nicotine dependence: Secondary | ICD-10-CM

## 2023-01-12 DIAGNOSIS — Z7901 Long term (current) use of anticoagulants: Secondary | ICD-10-CM

## 2023-01-12 DIAGNOSIS — Z881 Allergy status to other antibiotic agents status: Secondary | ICD-10-CM

## 2023-01-12 DIAGNOSIS — Z83438 Family history of other disorder of lipoprotein metabolism and other lipidemia: Secondary | ICD-10-CM

## 2023-01-12 DIAGNOSIS — R0681 Apnea, not elsewhere classified: Secondary | ICD-10-CM | POA: Diagnosis present

## 2023-01-12 DIAGNOSIS — Z1152 Encounter for screening for COVID-19: Secondary | ICD-10-CM | POA: Diagnosis not present

## 2023-01-12 DIAGNOSIS — I340 Nonrheumatic mitral (valve) insufficiency: Secondary | ICD-10-CM | POA: Diagnosis present

## 2023-01-12 DIAGNOSIS — R059 Cough, unspecified: Secondary | ICD-10-CM

## 2023-01-12 DIAGNOSIS — G4733 Obstructive sleep apnea (adult) (pediatric): Secondary | ICD-10-CM | POA: Diagnosis present

## 2023-01-12 HISTORY — PX: CORONARY/GRAFT ACUTE MI REVASCULARIZATION: CATH118305

## 2023-01-12 HISTORY — PX: LEFT HEART CATH AND CORONARY ANGIOGRAPHY: CATH118249

## 2023-01-12 LAB — ECHOCARDIOGRAM COMPLETE
AR max vel: 2.46 cm2
AV Area VTI: 2.58 cm2
AV Area mean vel: 2.47 cm2
AV Mean grad: 4 mmHg
AV Peak grad: 7.5 mmHg
Ao pk vel: 1.37 m/s
Area-P 1/2: 3.3 cm2
Height: 74 in
S' Lateral: 2.9 cm
Weight: 4035.3 oz

## 2023-01-12 LAB — LIPASE, BLOOD: Lipase: 27 U/L (ref 11–51)

## 2023-01-12 LAB — CBC
HCT: 42.8 % (ref 39.0–52.0)
Hemoglobin: 15.4 g/dL (ref 13.0–17.0)
MCH: 31.4 pg (ref 26.0–34.0)
MCHC: 36 g/dL (ref 30.0–36.0)
MCV: 87.2 fL (ref 80.0–100.0)
Platelets: 195 10*3/uL (ref 150–400)
RBC: 4.91 MIL/uL (ref 4.22–5.81)
RDW: 12.1 % (ref 11.5–15.5)
WBC: 14.6 10*3/uL — ABNORMAL HIGH (ref 4.0–10.5)
nRBC: 0 % (ref 0.0–0.2)

## 2023-01-12 LAB — BASIC METABOLIC PANEL
Anion gap: 12 (ref 5–15)
BUN: 18 mg/dL (ref 6–20)
CO2: 24 mmol/L (ref 22–32)
Calcium: 8.9 mg/dL (ref 8.9–10.3)
Chloride: 99 mmol/L (ref 98–111)
Creatinine, Ser: 1.29 mg/dL — ABNORMAL HIGH (ref 0.61–1.24)
GFR, Estimated: >60 mL/min (ref >60)
Glucose, Bld: 182 mg/dL — ABNORMAL HIGH (ref 70–99)
Potassium: 4 mmol/L (ref 3.5–5.1)
Sodium: 135 mmol/L (ref 135–145)

## 2023-01-12 LAB — POCT ACTIVATED CLOTTING TIME
Activated Clotting Time: 250 seconds
Activated Clotting Time: 238 seconds
Activated Clotting Time: 275 seconds

## 2023-01-12 LAB — LIPID PANEL
Cholesterol: 174 mg/dL (ref 0–200)
HDL: 51 mg/dL (ref >40)
LDL Cholesterol: 102 mg/dL — ABNORMAL HIGH (ref 0–99)
Total CHOL/HDL Ratio: 3.4 RATIO
Triglycerides: 104 mg/dL (ref <150)
VLDL: 21 mg/dL (ref 0–40)

## 2023-01-12 LAB — MRSA NEXT GEN BY PCR, NASAL: MRSA by PCR Next Gen: NOT DETECTED

## 2023-01-12 LAB — TROPONIN I (HIGH SENSITIVITY)
Troponin I (High Sensitivity): >24000 ng/L (ref <18)
Troponin I (High Sensitivity): >24000 ng/L (ref <18)

## 2023-01-12 LAB — GLUCOSE, CAPILLARY: Glucose-Capillary: 166 mg/dL — ABNORMAL HIGH (ref 70–99)

## 2023-01-12 SURGERY — CORONARY/GRAFT ACUTE MI REVASCULARIZATION
Anesthesia: Moderate Sedation

## 2023-01-12 MED ORDER — POLYETHYLENE GLYCOL 3350 17 G PO PACK
17.0000 g | PACK | Freq: Every evening | ORAL | Status: DC | PRN
  Administered 2023-01-13 – 2023-01-16 (×2): 17 g via ORAL
  Filled 2023-01-12 (×2): qty 1

## 2023-01-12 MED ORDER — MORPHINE SULFATE (PF) 2 MG/ML IV SOLN
2.0000 mg | INTRAVENOUS | Status: DC | PRN
  Administered 2023-01-12 – 2023-01-13 (×5): 2 mg via INTRAVENOUS
  Filled 2023-01-12 (×5): qty 1

## 2023-01-12 MED ORDER — HEPARIN (PORCINE) IN NACL 1000-0.9 UT/500ML-% IV SOLN
INTRAVENOUS | Status: AC
  Filled 2023-01-12: qty 1000

## 2023-01-12 MED ORDER — LORAZEPAM 2 MG/ML IJ SOLN
0.5000 mg | Freq: Four times a day (QID) | INTRAMUSCULAR | Status: AC | PRN
  Administered 2023-01-12: 0.5 mg via INTRAVENOUS
  Filled 2023-01-12: qty 1

## 2023-01-12 MED ORDER — FLUTICASONE PROPIONATE 50 MCG/ACT NA SUSP: 2.0000 | Freq: Every day | NASAL | Status: DC | PRN

## 2023-01-12 MED ORDER — ATORVASTATIN CALCIUM 20 MG PO TABS
40.0000 mg | ORAL_TABLET | Freq: Every day | ORAL | Status: DC
  Administered 2023-01-12 – 2023-01-16 (×5): 40 mg via ORAL
  Filled 2023-01-12 (×6): qty 2

## 2023-01-12 MED ORDER — HEPARIN SODIUM (PORCINE) 1000 UNIT/ML IJ SOLN
INTRAMUSCULAR | Status: AC
  Filled 2023-01-12: qty 10

## 2023-01-12 MED ORDER — ENOXAPARIN SODIUM 60 MG/0.6ML IJ SOSY: 55.0000 mg | PREFILLED_SYRINGE | INTRAMUSCULAR | Status: DC

## 2023-01-12 MED ORDER — SODIUM CHLORIDE 0.9 % IV SOLN: 12.5000 mg | Freq: Four times a day (QID) | INTRAVENOUS | Status: AC | PRN

## 2023-01-12 MED ORDER — VERAPAMIL HCL 2.5 MG/ML IV SOLN
INTRAVENOUS | Status: AC
  Filled 2023-01-12: qty 2

## 2023-01-12 MED ORDER — HEPARIN SODIUM (PORCINE) 5000 UNIT/ML IJ SOLN
4000.0000 [IU] | Freq: Once | INTRAMUSCULAR | Status: AC
  Administered 2023-01-12: 4000 [IU] via INTRAVENOUS

## 2023-01-12 MED ORDER — BENZONATATE 100 MG PO CAPS
200.0000 mg | ORAL_CAPSULE | Freq: Every evening | ORAL | Status: DC | PRN
  Administered 2023-01-13: 200 mg via ORAL
  Filled 2023-01-12: qty 2

## 2023-01-12 MED ORDER — ACETAMINOPHEN 325 MG PO TABS: 650.0000 mg | ORAL_TABLET | ORAL | Status: DC | PRN

## 2023-01-12 MED ORDER — TICAGRELOR 90 MG PO TABS
ORAL_TABLET | ORAL | Status: DC | PRN
  Administered 2023-01-12: 180 mg via ORAL

## 2023-01-12 MED ORDER — ONDANSETRON HCL 4 MG/2ML IJ SOLN
4.0000 mg | Freq: Four times a day (QID) | INTRAMUSCULAR | Status: DC | PRN
  Administered 2023-01-12: 4 mg via INTRAVENOUS
  Filled 2023-01-12: qty 2

## 2023-01-12 MED ORDER — ORAL CARE MOUTH RINSE: 15.0000 mL | OROMUCOSAL | Status: DC | PRN

## 2023-01-12 MED ORDER — SODIUM CHLORIDE 0.9 % IV SOLN: 250.0000 mL | INTRAVENOUS | Status: DC

## 2023-01-12 MED ORDER — HEPARIN (PORCINE) 25000 UT/250ML-% IV SOLN
2650.0000 [IU]/h | INTRAVENOUS | Status: DC
  Administered 2023-01-13: 1500 [IU]/h via INTRAVENOUS
  Administered 2023-01-13: 1800 [IU]/h via INTRAVENOUS
  Administered 2023-01-14: 2650 [IU]/h via INTRAVENOUS
  Administered 2023-01-14: 2100 [IU]/h via INTRAVENOUS
  Filled 2023-01-12 (×5): qty 250

## 2023-01-12 MED ORDER — SODIUM CHLORIDE 0.9% FLUSH: 3.0000 mL | INTRAVENOUS | Status: DC | PRN

## 2023-01-12 MED ORDER — SODIUM CHLORIDE 0.9% FLUSH
3.0000 mL | Freq: Two times a day (BID) | INTRAVENOUS | Status: DC
  Administered 2023-01-12 – 2023-01-14 (×5): 3 mL via INTRAVENOUS

## 2023-01-12 MED ORDER — ONDANSETRON HCL 4 MG PO TABS
4.0000 mg | ORAL_TABLET | Freq: Four times a day (QID) | ORAL | Status: DC | PRN
  Administered 2023-01-13 (×2): 4 mg via ORAL
  Filled 2023-01-12 (×2): qty 1

## 2023-01-12 MED ORDER — NOREPINEPHRINE BITARTRATE 1 MG/ML IV SOLN
INTRAVENOUS | Status: AC
  Filled 2023-01-12: qty 4

## 2023-01-12 MED ORDER — ASPIRIN 81 MG PO CHEW
324.0000 mg | CHEWABLE_TABLET | Freq: Once | ORAL | Status: AC
  Administered 2023-01-12: 324 mg via ORAL

## 2023-01-12 MED ORDER — CHLORHEXIDINE GLUCONATE CLOTH 2 % EX PADS
6.0000 | MEDICATED_PAD | Freq: Every day | CUTANEOUS | Status: DC
  Administered 2023-01-13 – 2023-01-15 (×3): 6 via TOPICAL

## 2023-01-12 MED ORDER — HYDRALAZINE HCL 20 MG/ML IJ SOLN: 10.0000 mg | INTRAMUSCULAR | Status: DC | PRN

## 2023-01-12 MED ORDER — TICAGRELOR 90 MG PO TABS
ORAL_TABLET | ORAL | Status: AC
  Filled 2023-01-12: qty 2

## 2023-01-12 MED ORDER — HYDRALAZINE HCL 20 MG/ML IJ SOLN: 5.0000 mg | Freq: Four times a day (QID) | INTRAMUSCULAR | Status: AC | PRN

## 2023-01-12 MED ORDER — FENTANYL CITRATE (PF) 100 MCG/2ML IJ SOLN
INTRAMUSCULAR | Status: AC
  Filled 2023-01-12: qty 2

## 2023-01-12 MED ORDER — CLOPIDOGREL BISULFATE 300 MG PO TABS: 300.0000 mg | ORAL_TABLET | Freq: Once | ORAL | Status: DC

## 2023-01-12 MED ORDER — TIROFIBAN (AGGRASTAT) BOLUS VIA INFUSION
INTRAVENOUS | Status: DC | PRN
  Administered 2023-01-12: 2800 ug via INTRAVENOUS

## 2023-01-12 MED ORDER — TIROFIBAN HCL IV 12.5 MG/250 ML
INTRAVENOUS | Status: AC
  Filled 2023-01-12: qty 250

## 2023-01-12 MED ORDER — HEPARIN (PORCINE) 25000 UT/250ML-% IV SOLN: 1400.0000 [IU]/h | INTRAVENOUS | Status: DC

## 2023-01-12 MED ORDER — NOREPINEPHRINE 4 MG/250ML-% IV SOLN: 0.0000 ug/min | INTRAVENOUS | Status: DC

## 2023-01-12 MED ORDER — NOREPINEPHRINE 4 MG/250ML-% IV SOLN: 2.0000 ug/min | INTRAVENOUS | Status: DC

## 2023-01-12 MED ORDER — MORPHINE SULFATE (PF) 2 MG/ML IV SOLN
2.0000 mg | INTRAVENOUS | Status: DC | PRN
  Administered 2023-01-12: 2 mg via INTRAVENOUS
  Filled 2023-01-12: qty 1

## 2023-01-12 MED ORDER — CHLORHEXIDINE GLUCONATE CLOTH 2 % EX PADS: 6.0000 | MEDICATED_PAD | Freq: Every day | CUTANEOUS | Status: DC

## 2023-01-12 MED ORDER — TIROFIBAN HCL IV 12.5 MG/250 ML
0.1500 ug/kg/min | INTRAVENOUS | Status: DC
  Administered 2023-01-12: 0.1533 ug/kg/min via INTRAVENOUS
  Filled 2023-01-12 (×2): qty 250

## 2023-01-12 MED ORDER — NOREPINEPHRINE BITARTRATE 1 MG/ML IV SOLN
INTRAVENOUS | Status: DC | PRN
  Administered 2023-01-12: 5 ug/min via INTRAVENOUS

## 2023-01-12 MED ORDER — METOPROLOL TARTRATE 25 MG PO TABS
12.5000 mg | ORAL_TABLET | Freq: Two times a day (BID) | ORAL | Status: DC
  Administered 2023-01-12: 12.5 mg via ORAL
  Filled 2023-01-12: qty 1

## 2023-01-12 MED ORDER — ROSUVASTATIN CALCIUM 10 MG PO TABS: 20.0000 mg | ORAL_TABLET | Freq: Every day | ORAL | Status: DC

## 2023-01-12 MED ORDER — HEPARIN BOLUS VIA INFUSION
4000.0000 [IU] | Freq: Once | INTRAVENOUS | Status: AC
  Administered 2023-01-13: 4000 [IU] via INTRAVENOUS
  Filled 2023-01-12: qty 4000

## 2023-01-12 MED ORDER — HEPARIN SODIUM (PORCINE) 5000 UNIT/ML IJ SOLN: 5000.0000 [IU] | Freq: Three times a day (TID) | INTRAMUSCULAR | Status: DC

## 2023-01-12 MED ORDER — LIDOCAINE HCL 1 % IJ SOLN
INTRAMUSCULAR | Status: AC
  Filled 2023-01-12: qty 20

## 2023-01-12 MED ORDER — SENNOSIDES-DOCUSATE SODIUM 8.6-50 MG PO TABS: 1.0000 | ORAL_TABLET | Freq: Every evening | ORAL | Status: DC | PRN

## 2023-01-12 MED ORDER — APIXABAN 5 MG PO TABS
5.0000 mg | ORAL_TABLET | Freq: Two times a day (BID) | ORAL | Status: DC
Start: 2023-01-13 — End: 2023-01-12

## 2023-01-12 MED ORDER — ACETAMINOPHEN 650 MG RE SUPP: 650.0000 mg | Freq: Four times a day (QID) | RECTAL | Status: DC | PRN

## 2023-01-12 MED ORDER — HEPARIN SODIUM (PORCINE) 1000 UNIT/ML IJ SOLN
INTRAMUSCULAR | Status: DC | PRN
  Administered 2023-01-12: 7000 [IU] via INTRAVENOUS
  Administered 2023-01-12: 2000 [IU] via INTRAVENOUS

## 2023-01-12 MED ORDER — SODIUM CHLORIDE 0.9 % IV SOLN
INTRAVENOUS | Status: DC | PRN
  Administered 2023-01-12: 100 mL/h via INTRAVENOUS
  Administered 2023-01-12 (×2): 250 mL via INTRAVENOUS

## 2023-01-12 MED ORDER — TIROFIBAN HCL IV 12.5 MG/250 ML
INTRAVENOUS | Status: DC | PRN
  Administered 2023-01-12: .15 ug/kg/min via INTRAVENOUS

## 2023-01-12 MED ORDER — TICAGRELOR 90 MG PO TABS: 90.0000 mg | ORAL_TABLET | Freq: Two times a day (BID) | ORAL | Status: DC

## 2023-01-12 MED ORDER — LIDOCAINE HCL (PF) 1 % IJ SOLN
INTRAMUSCULAR | Status: DC | PRN
  Administered 2023-01-12: 2 mL

## 2023-01-12 MED ORDER — SODIUM CHLORIDE 0.9 % IV SOLN: 250.0000 mL | INTRAVENOUS | Status: DC | PRN

## 2023-01-12 MED ORDER — CLOPIDOGREL BISULFATE 300 MG PO TABS
600.0000 mg | ORAL_TABLET | Freq: Once | ORAL | Status: AC
  Administered 2023-01-13: 600 mg via ORAL
  Filled 2023-01-12: qty 2

## 2023-01-12 MED ORDER — VERAPAMIL HCL 2.5 MG/ML IV SOLN
INTRAVENOUS | Status: DC | PRN
  Administered 2023-01-12 (×2): 2.5 mg via INTRA_ARTERIAL

## 2023-01-12 MED ORDER — CLOPIDOGREL BISULFATE 75 MG PO TABS
75.0000 mg | ORAL_TABLET | Freq: Every day | ORAL | Status: DC
  Administered 2023-01-14 – 2023-01-16 (×3): 75 mg via ORAL
  Filled 2023-01-12 (×3): qty 1

## 2023-01-12 MED ORDER — ASPIRIN 81 MG PO CHEW
81.0000 mg | CHEWABLE_TABLET | Freq: Every day | ORAL | Status: DC
  Administered 2023-01-13 – 2023-01-16 (×4): 81 mg via ORAL
  Filled 2023-01-12 (×4): qty 1

## 2023-01-12 MED ORDER — MIDAZOLAM HCL 2 MG/2ML IJ SOLN
INTRAMUSCULAR | Status: AC
  Filled 2023-01-12: qty 2

## 2023-01-12 MED ORDER — ACETAMINOPHEN 325 MG PO TABS
650.0000 mg | ORAL_TABLET | Freq: Four times a day (QID) | ORAL | Status: DC | PRN
  Administered 2023-01-12 – 2023-01-15 (×5): 650 mg via ORAL
  Filled 2023-01-12 (×6): qty 2

## 2023-01-12 MED ORDER — MELATONIN 5 MG PO TABS
5.0000 mg | ORAL_TABLET | Freq: Every evening | ORAL | Status: DC | PRN
  Administered 2023-01-13 (×2): 5 mg via ORAL
  Filled 2023-01-12 (×2): qty 1

## 2023-01-12 SURGICAL SUPPLY — 25 items
BALLN EUPHORA RX 2.0X12 (BALLOONS) ×1
BALLN MINITREK RX 2.0X12 (BALLOONS) ×1
BALLN TREK RX 2.5X15 (BALLOONS) ×1
BALLN ~~LOC~~ EUPHORA RX 3.25X15 (BALLOONS) ×1
BALLOON EUPHORA RX 2.0X12 (BALLOONS) IMPLANT
BALLOON MINITREK RX 2.0X12 (BALLOONS) IMPLANT
BALLOON TREK RX 2.5X15 (BALLOONS) IMPLANT
BALLOON ~~LOC~~ EUPHORA RX 3.25X15 (BALLOONS) IMPLANT
CATH INFINITI 5FR ANG PIGTAIL (CATHETERS) IMPLANT
CATH INFINITI AMBI 5FR TG (CATHETERS) IMPLANT
CATH LAUNCHER 6FR JR4 (CATHETERS) IMPLANT
DEVICE RAD COMP TR BAND LRG (VASCULAR PRODUCTS) IMPLANT
DRAPE BRACHIAL (DRAPES) IMPLANT
GLIDESHEATH SLEND SS 6F .021 (SHEATH) IMPLANT
GUIDEWIRE INQWIRE 1.5J.035X260 (WIRE) IMPLANT
INQWIRE 1.5J .035X260CM (WIRE) ×1
KIT ENCORE 26 ADVANTAGE (KITS) IMPLANT
PACK CARDIAC CATH (CUSTOM PROCEDURE TRAY) ×1 IMPLANT
PROTECTION STATION PRESSURIZED (MISCELLANEOUS) ×1
SET ATX-X65L (MISCELLANEOUS) IMPLANT
STATION PROTECTION PRESSURIZED (MISCELLANEOUS) IMPLANT
STENT ONYX FRONTIER 3.0X18 (Permanent Stent) IMPLANT
TUBING CIL FLEX 10 FLL-RA (TUBING) IMPLANT
WIRE ASAHI FIELDER XT 300CM (WIRE) IMPLANT
WIRE RUNTHROUGH .014X180CM (WIRE) IMPLANT

## 2023-01-12 NOTE — TOC Benefit Eligibility Note (Addendum)
Patient Product/process development scientist completed.    The patient is insured through [facility]. Patient has [employer], may use a copay card, and/or apply for patient assistance if available.    Ran test claim for Brilinta 90 mg and the current 30 day co-pay is $127.85 due to a $5000 deductible.  Ran test claim for Eliquis 5 mg and the current 30 day co-pay is $168.42 due to a $5000 deductible.  Ran test claim for Xarelto 20 mg and the current 30 day co-pay is $161.39 due to a $5000 deductible.   This test claim was processed through [facility]- copay amounts may vary at other pharmacies due to pharmacy/plan contracts, or as the patient moves through the different stages of their insurance plan.     Roland Earl, CPHT Pharmacy Patient Advocate Specialist [facility] Patient Advocate Team Direct Number: [phone]  Fax: [phone]

## 2023-01-12 NOTE — Plan of Care (Signed)
Discussed with patient plan of care for the evening, pain management and medications with some teach back displayed  Problem: Education: Goal: Knowledge of General Education information will improve Description: Including pain rating scale, medication(s)/side effects and non-pharmacologic comfort measures Outcome: Progressing   Problem: Health Behavior/Discharge Planning: Goal: Ability to manage health-related needs will improve Outcome: Progressing   Problem: Pain Managment: Goal: General experience of comfort will improve Outcome: Not Progressing

## 2023-01-12 NOTE — Assessment & Plan Note (Signed)
Patient is currently on tirofiban infusion followed by heparin infusion Upon discontinuation of heparin GTT and discharge, patient would benefit from Eliquis 5 mg p.o. twice daily

## 2023-01-12 NOTE — Assessment & Plan Note (Signed)
Status post left heart cath with PCI to RCA Per cardiology recommendation, continue to tirofiban infusion for 18 hours followed by heparin infusion Complete echo ordered Given atrial fibrillation, per cardiology: Recommend aspirin, Plavix, and apixaban for 1 week and subsequently maintaining apixaban and clopidogrel for 12 months Aggressive secondary prevention of coronary artery disease Continue inpatient, ICU monitoring on day of admission

## 2023-01-12 NOTE — Assessment & Plan Note (Signed)
Suspect secondary to cardiorenal Treat per above Encourage p.o. intake

## 2023-01-12 NOTE — Hospital Course (Signed)
Mr. Keith Torres, Keith Torres. is a 57 year old male with history of hypertension, atrial fibrillation was on flecainide, who presents to the emergency department for chief concerns of chest pressure, vomiting, abdominal distention.  Patient went to [facility] clinic urgent care center and was found to be hypotensive and febrile and was sent to the emergency department for further evaluation.  Vitals in the ED showed temperature of 97.6, respiration rate of 20, heart rate of 89, blood pressure 115/77, SpO2 of 94% on 2 L nasal cannula.  Serum sodium is 135, potassium 4.0, chloride 99, bicarb 24, BUN of 18, serum creatinine of 1.29, nonfasting blood glucose 182, WBC 14.6, platelets of 195, hemoglobin 15.4.  High sensitive troponin is greater than 24,000  EKG showed ST elevation in leads II, 3, aVF  Code STEMI was called  ED treatment: Patient received aspirin 324 mg p.o. one-time dose, heparin GTT was initiated.    STEMI cardiologist was called to bedside, patient was taken to the Cath Lab for coronary acute myocardial infarction revascularization.  Left heart cath showed: Severe single-vessel coronary artery disease, with thrombotic occlusion of mid RCA involving acute marginal branch as well as embolized thrombus in the RPDA.  There is moderate, nonobstructive mid LAD disease up to 50%.  Patient is status post PCI to RCA, tirofiban infusion for 18 hours, and recommended heparin infusion after discontinuation of tirofiban.

## 2023-01-12 NOTE — Progress Notes (Signed)
An USGPIV (ultrasound guided PIV) 20G x 1.88" on LFA has been placed for short-term vasopressor infusion. A correctly placed ivWatch must be used when administering Vasopressors. Should this treatment be needed beyond 24 hours, central line access should be obtained.  It will be the responsibility of the bedside nurse to follow best practice to prevent extravasations.

## 2023-01-12 NOTE — ED Notes (Signed)
Pt placed on Zoll pads. 

## 2023-01-12 NOTE — TOC CM/SW Note (Signed)
TOC received consult SNF. Cm will await PT/OT evaluations for recommendations.

## 2023-01-12 NOTE — Progress Notes (Signed)
  This Chap responded to Code Stemi. Pt now being wheeled out of room to [place] lab, Nurse walking with fam in the [place]. No spiritual or emotional needs at  this point. Page the [place] as need arise.   01/12/23 1100  Spiritual Encounters  Type of Visit Initial  Care provided to: Pt not available  Referral source Code page  Reason for visit Code  OnCall Visit Yes

## 2023-01-12 NOTE — ED Provider Notes (Signed)
[facility] Provider Note    Event Date/Time   First MD Initiated Contact with Patient 01/12/23 1011     (approximate)   History   Chest Pain   HPI  Keith Torres. is a 58 y.o. male with history of paroxysmal A-fib, hyperlipidemia presenting to the emergency department for evaluation of chest pressure.  5 days ago patient was playing golf when he developed onset of chest tightness that has been persistent over the last several days.  This morning he had an episode of vomiting leading him to present to the ER.  EKG obtained in triage demonstrating STEMI, code STEMI activated.  Patient currently reporting mild chest tightness.  He is on flecainide for his A-fib, not currently taking anticoagulation.      Physical Exam   Triage Vital Signs: ED Triage Vitals  Encounter Vitals Group     BP 01/12/23 1002 96/62     Systolic BP Percentile --      Diastolic BP Percentile --      Pulse Rate 01/12/23 1007 81     Resp 01/12/23 0959 20     Temp 01/12/23 0959 98.8 F (37.1 C)     Temp Source 01/12/23 1234 Oral     SpO2 01/12/23 1010 95 %     Weight 01/12/23 0959 247 lb (112 kg)     Height 01/12/23 0959 6\' 2"  (1.88 m)     Head Circumference --      Peak Flow --      Pain Score 01/12/23 0959 5     Pain Loc --      Pain Education --      Exclude from Growth Chart --     Most recent vital signs: Vitals:   01/12/23 1441 01/12/23 1500  BP:  (!) 111/99  Pulse: 92 99  Resp: 20 20  Temp:    SpO2: 99% 98%     General: Awake, interactive  CV:  Regular rate, good peripheral perfusion.  Resp:  Lungs clear, unlabored respirations.  Abd:  Soft, nondistended.  Neuro:  Symmetric facial movement, fluid speech   ED Results / Procedures / Treatments   Labs (all labs ordered are listed, but only abnormal results are displayed) Labs Reviewed  BASIC METABOLIC PANEL - Abnormal; Notable for the following components:      Result Value   Glucose, Bld 182  (*)    Creatinine, Ser 1.29 (*)    All other components within normal limits  CBC - Abnormal; Notable for the following components:   WBC 14.6 (*)    All other components within normal limits  LIPID PANEL - Abnormal; Notable for the following components:   LDL Cholesterol 102 (*)    All other components within normal limits  GLUCOSE, CAPILLARY - Abnormal; Notable for the following components:   Glucose-Capillary 166 (*)    All other components within normal limits  TROPONIN I (HIGH SENSITIVITY) - Abnormal; Notable for the following components:   Troponin I (High Sensitivity) >24,000 (*)    All other components within normal limits  TROPONIN I (HIGH SENSITIVITY) - Abnormal; Notable for the following components:   Troponin I (High Sensitivity) >24,000 (*)    All other components within normal limits  MRSA NEXT GEN BY PCR, NASAL  LIPASE, BLOOD  POCT ACTIVATED CLOTTING TIME  POCT ACTIVATED CLOTTING TIME  POCT ACTIVATED CLOTTING TIME     EKG EKG independently reviewed interpreted by myself (ER attending) demonstrates:  EKG demonstrates AFib at a rate of 81, QRS 104, QTc 443, STE in inferior and high lateral leads with reciprocal depressions.   RADIOLOGY Imaging independently reviewed and interpreted by myself demonstrates:  CXR without focal consolidation  PROCEDURES:  Critical Care performed: Yes, see critical care procedure note(s)  CRITICAL CARE Performed by: Trinna Post   Total critical care time: 31 minutes  Critical care time was exclusive of separately billable procedures and treating other patients.  Critical care was necessary to treat or prevent imminent or life-threatening deterioration.  Critical care was time spent personally by me on the following activities: development of treatment plan with patient and/or surrogate as well as nursing, discussions with consultants, evaluation of patient's response to treatment, examination of patient, obtaining history from  patient or surrogate, ordering and performing treatments and interventions, ordering and review of laboratory studies, ordering and review of radiographic studies, pulse oximetry and re-evaluation of patient's condition.   Procedures   MEDICATIONS ORDERED IN ED: Medications  sodium chloride flush (NS) 0.9 % injection 3 mL (3 mLs Intravenous Given 01/12/23 1248)  sodium chloride flush (NS) 0.9 % injection 3 mL (has no administration in time range)  0.9 %  sodium chloride infusion (has no administration in time range)  rosuvastatin (CRESTOR) tablet 20 mg (20 mg Oral Not Given 01/12/23 1348)  aspirin chewable tablet 81 mg (has no administration in time range)  Chlorhexidine Gluconate Cloth 2 % PADS 6 each (has no administration in time range)  0.9 %  sodium chloride infusion (has no administration in time range)  norepinephrine (LEVOPHED) 4mg  in (0.016 mg/mL) premix infusion (6 mcg/min Intravenous Transfusing/Transfer 01/12/23 1433)  tirofiban (AGGRASTAT) infusion 50 mcg/mL 250 mL (0.1533 mcg/kg/min  114.4 kg Intravenous Infusion Verify 01/12/23 1407)  benzonatate (TESSALON) capsule 200 mg (has no administration in time range)  fluticasone (FLONASE) 50 MCG/ACT nasal spray 2 spray (has no administration in time range)  acetaminophen (TYLENOL) tablet 650 mg (has no administration in time range)    Or  acetaminophen (TYLENOL) suppository 650 mg (has no administration in time range)  ondansetron (ZOFRAN) tablet 4 mg (has no administration in time range)    Or  ondansetron (ZOFRAN) injection 4 mg (has no administration in time range)  senna-docusate (Senokot-S) tablet 1 tablet (has no administration in time range)  morphine (PF) 2 MG/ML injection 2 mg (2 mg Intravenous Given 01/12/23 1534)  hydrALAZINE (APRESOLINE) injection 5 mg (has no administration in time range)  polyethylene glycol (MIRALAX / GLYCOLAX) packet 17 g (has no administration in time range)  melatonin tablet 5 mg (has no  administration in time range)  promethazine (PHENERGAN) 12.5 mg in sodium chloride 0.9 % 50 mL IVPB (has no administration in time range)  clopidogrel (PLAVIX) tablet 75 mg (has no administration in time range)  clopidogrel (PLAVIX) tablet 600 mg (has no administration in time range)  heparin bolus via infusion 4,000 Units (has no administration in time range)    Followed by  heparin ADULT infusion 100 units/mL (25000 units/210mL) (has no administration in time range)  aspirin chewable tablet 324 mg (324 mg Oral Given 01/12/23 1013)  heparin injection 4,000 Units (4,000 Units Intravenous Given 01/12/23 1039)     IMPRESSION / MDM / ASSESSMENT AND PLAN / ED COURSE  I reviewed the triage vital signs and the nursing notes.  Differential diagnosis includes, but is not limited to, STEMI, electrolyte abnormality, arrhythmia  Patient's presentation is most consistent with acute presentation with potential threat to  life or bodily function.  57 year old male presenting to the emergency department for evaluation of chest tightness.  EKG demonstrated STEMI.  Activated as a code STEMI.  Given 324 of aspirin and heparin.  Taken to [facility] for further management.      FINAL CLINICAL IMPRESSION(S) / ED DIAGNOSES   Final diagnoses:  ST elevation myocardial infarction (STEMI), unspecified artery (HCC)     Rx / DC Orders   ED Discharge Orders          Ordered    AMB Referral to Cardiac Rehabilitation - Phase II        01/12/23 1219             Note:  This document was prepared using Dragon voice recognition software and may include unintentional dictation errors.   Trinna Post, MD 01/12/23 [phone]

## 2023-01-12 NOTE — ED Notes (Signed)
Dr End at bedside with pt

## 2023-01-12 NOTE — Consult Note (Signed)
Cardiology Consultation   Patient ID: Mack Czaja. MRN: [id number]; DOB: May 16, 1966  Admit date: 01/12/2023 Date of Consult: 01/12/2023  PCP:  Dione Housekeeper, MD   [facility] Providers Cardiologist:  Dr. Gwen Pounds     Patient Profile:   Trai Nash. is a 57 y.o. male with a hx of paroxysmal atrial fibrillation, first-degree AV block, moderate mitral insufficiency, OSA on CPAP, hyperlipidemia, who is being seen 01/12/2023 for the evaluation of chest pain and abnormal EKG at the request of Dr. Rosalia Hammers.  History of Present Illness:   Mr. Bechler developed chest tightness while playing golf 5 days ago.  He rested for a few minutes with resolution of the discomfort.  He was aboe to complete the round of golf but has subsequently felt more fatigued the last several days.  He has also had persistent tightness in his shoulders as well as shortness of breath with minimal activity.  He has tried to belch to alleviate the symptoms, which has not helped much.  Today, he became nauseated and vomited, prompting him to go to [facility] office.  He was referred to ED for further evaluation and found to have inferior ST elevation on EKG.  He continues to have some mild tightness in his chest as well as generalized malaise.  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  He currently takes only flecainide and meloxicam (he was on metoprolol at one point but states that he no longer takes this).   Past Medical History:  Diagnosis Date   Anxiety    Atrial fibrillation (HCC)    Dental crowns present    FRONT TWO TEETH   Deviated septum    Dysrhythmia    A-FIB/ DR Gwen Pounds   Hyperlipidemia    Hypertrophy of nasal turbinates    Meningitis    Obesity    Rhinitis, allergic    Seasonal allergies    Sleep apnea    C-PAP    Past Surgical History:  Procedure Laterality Date   APPENDECTOMY  1974   BACK SURGERY  2015   COLONOSCOPY      COLONOSCOPY WITH PROPOFOL N/A 10/08/2015   Procedure: COLONOSCOPY WITH PROPOFOL;  Surgeon: Scot Jun, MD;  Location: [facility] ENDOSCOPY;  Service: Endoscopy;  Laterality: N/A;   HERNIA REPAIR Right 01-17-01   Indirect hernia, Prolene hernia system.   HERNIA REPAIR Left 03/23/14   Left indirect inguinal hernia repair, large ultra Pro mesh   NASAL TURBINATE REDUCTION Bilateral 05/21/2015   Procedure: TURBINATE REDUCTION/SUBMUCOSAL RESECTION;  Surgeon: Linus Salmons, MD;  Location: [facility];  Service: ENT;  Laterality: Bilateral;   SEPTOPLASTY N/A 05/21/2015   Procedure: SEPTOPLASTY;  Surgeon: Linus Salmons, MD;  Location: [facility];  Service: ENT;  Laterality: N/A;  C-PAP     Inpatient Medications: Scheduled Meds:  Continuous Infusions:  PRN Meds:   Allergies:    Allergies  Allergen Reactions   Azithromycin Other (See Comments)    Drug interacts with current daily medication.    Social History:   Social History   Tobacco Use   Smoking status: Never   Smokeless tobacco: Never  Substance Use Topics   Alcohol use: No    Comment: occasionally   Drug use: No     Family History:   Family History  Problem Relation Age of Onset   Cancer Mother        colon  Heart disease Father    Hyperlipidemia Father    CAD Father      ROS:  Please see the history of present illness. All other ROS reviewed and negative.     Physical Exam/Data:   Vitals:   01/12/23 1002 01/12/23 1007 01/12/23 1010 01/12/23 1025  BP: 96/62  122/82   Pulse:  81 90 87  Resp:   (!) 21 20  Temp:      SpO2:   95% 100%  Weight:      Height:       No intake or output data in the 24 hours ending 01/12/23 1035    01/12/2023    9:59 AM 05/30/2018   11:03 PM 10/08/2015    2:10 PM  Last 3 Weights  Weight (lbs) 247 lb 240 lb 11.2 oz 240 lb  Weight (kg) 112.038 kg 109.181 kg 108.863 kg     Body mass index is 31.71 kg/m.  General:  Uncom HEENT: normal Neck: no  JVD Vascular: No carotid bruits; Distal pulses 2+ bilaterally Cardiac: Distant heart sounds.  Irregularly irregular rhythm without murmurs. Lungs:  clear to auscultation bilaterally, no wheezing, rhonchi or rales  Abd: soft, nontender, no hepatomegaly  Ext: no edema Musculoskeletal:  No deformities, BUE and BLE strength normal and equal Skin: warm and dry  Neuro:  CNs 2-12 intact, no focal abnormalities noted Psych:  Normal affect   EKG:  The EKG was personally reviewed and demonstrates: Atrial fibrillation with inferolateral ST elevation and ST depressions in 1, aVL, V1, and V2. Telemetry:  Telemetry was personally reviewed and demonstrates: Atrial fibrillation  Relevant CV Studies: Exercise stress echocardiogram (07/16/2020, [facility]): Normal study with baseline LVEF of 50% and hyperkinetic response to exercise.  Mild mitral regurgitation noted.  Laboratory Data:  High Sensitivity Troponin:  No results for input(s): "TROPONINIHS" in the last 720 hours.   ChemistryNo results for input(s): "NA", "K", "CL", "CO2", "GLUCOSE", "BUN", "CREATININE", "CALCIUM", "MG", "GFRNONAA", "GFRAA", "ANIONGAP" in the last 168 hours.  No results for input(s): "PROT", "ALBUMIN", "AST", "ALT", "ALKPHOS", "BILITOT" in the last 168 hours. Lipids No results for input(s): "CHOL", "TRIG", "HDL", "LABVLDL", "LDLCALC", "CHOLHDL" in the last 168 hours.  Hematology Recent Labs  Lab 01/12/23 1008  WBC 14.6*  RBC 4.91  HGB 15.4  HCT 42.8  MCV 87.2  MCH 31.4  MCHC 36.0  RDW 12.1  PLT 195   Thyroid No results for input(s): "TSH", "FREET4" in the last 168 hours.  BNPNo results for input(s): "BNP", "PROBNP" in the last 168 hours.  DDimer No results for input(s): "DDIMER" in the last 168 hours.   Radiology/Studies:  No results found.   Assessment and Plan:   Inferolateral STEMI: Mr. Hovan presents with 5-day history of intermittent chest tightness accompanied by dyspnea, generalized fatigue, and  nausea/vomiting today.  EKG consistent with inferolateral ST elevation myocardial infarction.  Given continued symptoms, we will proceed with emergent cardiac catheterization and possible PCI.  Risks and benefits of the procedure have been discussed with Mr. Foxx; he has provided emergent verbal consent.  The patient has already received aspirin and IV heparin in the emergency department.  Further recommendations to follow catheterization.  Paroxysmal atrial fibrillation: Patient with history of PAF, currently on flecainide.  In the light of his ischemic heart disease, flecainide will no longer be an option.  CHA2DS2-VASc score at this juncture is 1 in the setting of CAD.  Will need further restratification with echocardiogram and hemoglobin A1c to determine  if long-term anticoagulation is indicated.  Hyperlipidemia: History of hyperlipidemia with statin intolerance.  Will recheck lipid panel and consider rechallenging with statin versus consideration of PCSK9 inhibitor as an outpatient.  For questions or updates, please contact [facility] Please consult [email or web address] for contact info under [facility].  Signed, Yvonne Kendall, MD  01/12/2023 10:35 AM

## 2023-01-12 NOTE — Assessment & Plan Note (Signed)
Recommended apixaban and clopidogrel for 12 months post discharge Aggressive secondary coronary artery disease prevention

## 2023-01-12 NOTE — Assessment & Plan Note (Signed)
-   Secondary to STEMI, treat per above

## 2023-01-12 NOTE — Progress Notes (Signed)
*  PRELIMINARY RESULTS* Echocardiogram 2D Echocardiogram has been performed.  Cristela Blue 01/12/2023, 2:32 PM

## 2023-01-12 NOTE — H&P (Signed)
History and Physical   [name]. ZOX:[id number] DOB: 1965-07-29 DOA: 01/12/2023  PCP: Dione Housekeeper, MD  Outpatient Specialists: [facility] Patient coming from: [facility] clinic urgent care center  I have personally briefly reviewed patient's old medical records in [facility] EMR.  Chief Concern: Chest pressure, shortness of breath  HPI: Mr. Cobi Tafel, Montez Hageman. is a 57 year old male with history of hypertension, atrial fibrillation was on flecainide, who presents to the emergency department for chief concerns of chest pressure, vomiting, abdominal distention.  Patient went to [facility] clinic urgent care center and was found to be hypotensive and febrile and was sent to the emergency department for further evaluation.  Vitals in the ED showed temperature of 97.6, respiration rate of 20, heart rate of 89, blood pressure 115/77, SpO2 of 94% on 2 L nasal cannula.  Serum sodium is 135, potassium 4.0, chloride 99, bicarb 24, BUN of 18, serum creatinine of 1.29, nonfasting blood glucose 182, WBC 14.6, platelets of 195, hemoglobin 15.4.  High sensitive troponin is greater than 24,000  EKG showed ST elevation in leads II, 3, aVF  Code STEMI was called  ED treatment: Patient received aspirin 324 mg p.o. one-time dose, heparin GTT was initiated.    STEMI cardiologist was called to bedside, patient was taken to the Cath Lab for coronary acute myocardial infarction revascularization.  Left heart cath showed: Severe single-vessel coronary artery disease, with thrombotic occlusion of mid RCA involving acute marginal branch as well as embolized thrombus in the RPDA.  There is moderate, nonobstructive mid LAD disease up to 50%.  Patient is status post PCI to RCA, tirofiban infusion for 18 hours, and recommended heparin infusion after discontinuation of tirofiban. ------------------------------- At bedside, patient was able to tell me his name, age, location, current  calendar year.  He reports that on Sunday, 8/4, patient was golfing 23 holes.  On that day, he was walking uphill and he noticed shortness of breath and fatigue which was abnormal for him.  The following days he continued to feel fatigue and weak along with shortness of breath with exertion.  He endorses feeling chest pressure, he felt like he had gas in his stomach and distant abdomen.  He reports today he had multiple episodes of nausea and vomiting, prompting him to present to the [facility] clinic urgent care for further evaluation.  He reports he has never experiencing these episodes before.  Social history: He is at home with his wife.  He denies current tobacco use.  He infrequently consumes alcohol, he last had 2 beers on Saturday.  He denies recreational drug use.  He works in [employer].  ROS: Constitutional: no weight change, no fever ENT/Mouth: no sore throat, no rhinorrhea Eyes: no eye pain, no vision changes Cardiovascular: + chest pain, + dyspnea,  no edema, no palpitations Respiratory: no cough, no sputum, no wheezing Gastrointestinal: no nausea, no vomiting, no diarrhea, no constipation Genitourinary: no urinary incontinence, no dysuria, no hematuria Musculoskeletal: no arthralgias, no myalgias Skin: no skin lesions, no pruritus, Neuro: + weakness, no loss of consciousness, no syncope Psych: no anxiety, no depression, + decrease appetite Heme/Lymph: no bruising, no bleeding  Assessment/Plan  Principal Problem:   STEMI involving right coronary artery (HCC) Active Problems:   Anxiety   Breathlessness on exertion   Adiposity   Paroxysmal atrial fibrillation (HCC)   ST elevation myocardial infarction involving right coronary artery (HCC)   AKI (acute kidney injury) (HCC)   CAD S/P percutaneous coronary angioplasty  Assessment and Plan:  * STEMI involving right coronary artery (HCC) Status post left heart cath with PCI to RCA Per cardiology recommendation,  continue to tirofiban infusion for 18 hours followed by heparin infusion Complete echo ordered Given atrial fibrillation, per cardiology: Recommend aspirin, Plavix, and apixaban for 1 week and subsequently maintaining apixaban and clopidogrel for 12 months Aggressive secondary prevention of coronary artery disease Continue inpatient, ICU monitoring on day of admission  CAD S/P percutaneous coronary angioplasty Recommended apixaban and clopidogrel for 12 months post discharge Aggressive secondary coronary artery disease prevention  AKI (acute kidney injury) (HCC) Suspect secondary to cardiorenal Treat per above Encourage p.o. intake  Paroxysmal atrial fibrillation (HCC) Patient is currently on tirofiban infusion followed by heparin infusion Upon discontinuation of heparin GTT and discharge, patient would benefit from Eliquis 5 mg p.o. twice daily  Breathlessness on exertion Secondary to STEMI, treat per above  Anxiety Ativan 0.5 mg IV every 6 hours as needed for anxiety, 20 hours of coverage ordered  Chart reviewed.   DVT prophylaxis: Aggrastat infusion followed by heparin infusion Code Status: Full code Diet: Heart healthy Family Communication: Updated patient's spouse, Arline Asp, his son Rubye Oaks, and daughter-in-law Irving Burton at bedside with patient's permission Disposition Plan: Pending clinical course Consults called: Cardiology Admission status: Inpatient, ICU  Past Medical History:  Diagnosis Date   Anxiety    Atrial fibrillation (HCC)    Dental crowns present    FRONT TWO TEETH   Deviated septum    Dysrhythmia    A-FIB/ DR Gwen Pounds   Hyperlipidemia    Hypertrophy of nasal turbinates    Meningitis    Obesity    Rhinitis, allergic    Seasonal allergies    Sleep apnea    C-PAP   Past Surgical History:  Procedure Laterality Date   APPENDECTOMY  06/05/1972   BACK SURGERY  06/05/2013   COLONOSCOPY     COLONOSCOPY WITH PROPOFOL N/A 10/08/2015   Procedure: COLONOSCOPY  WITH PROPOFOL;  Surgeon: Scot Jun, MD;  Location: [facility] ENDOSCOPY;  Service: Endoscopy;  Laterality: N/A;   CORONARY ANGIOPLASTY WITH STENT PLACEMENT     HERNIA REPAIR Right 01/17/2001   Indirect hernia, Prolene hernia system.   HERNIA REPAIR Left 03/23/2014   Left indirect inguinal hernia repair, large ultra Pro mesh   NASAL TURBINATE REDUCTION Bilateral 05/21/2015   Procedure: TURBINATE REDUCTION/SUBMUCOSAL RESECTION;  Surgeon: Linus Salmons, MD;  Location: [facility];  Service: ENT;  Laterality: Bilateral;   SEPTOPLASTY N/A 05/21/2015   Procedure: SEPTOPLASTY;  Surgeon: Linus Salmons, MD;  Location: [facility];  Service: ENT;  Laterality: N/A;  C-PAP   Social History:  reports that he has never smoked. He quit smokeless tobacco use about 25 years ago.  His smokeless tobacco use included chew. He reports that he does not currently use alcohol. He reports that he does not use drugs.  Allergies  Allergen Reactions   Azithromycin Other (See Comments)    Drug interacts with current daily medication.   Family History  Problem Relation Age of Onset   Cancer Mother        colon   Heart disease Father    Hyperlipidemia Father    CAD Father    Family history: Family history reviewed and not pertinent.  Prior to Admission medications   Medication Sig Start Date End Date Taking? Authorizing Provider  flecainide (TAMBOCOR) 100 MG tablet Take 100 mg by mouth 2 (two) times daily. AM AND PM   Yes [provider]  meloxicam (MOBIC) 7.5 MG tablet Take 1 tablet by mouth daily. 12/24/22  Yes [provider]  albuterol (PROVENTIL HFA;VENTOLIN HFA) 108 (90 Base) MCG/ACT inhaler  09/21/17   [provider]  benzonatate (TESSALON) 200 MG capsule Take 1 capsule (200 mg total) by mouth at bedtime as needed for cough. Patient not taking: Reported on 01/12/2023 10/26/17   Ralene Muskrat, FNP  colchicine 0.6 MG tablet Take 2 tablets (1.2 mg) at onset  of gout flare. Take 1 additional tablet (0.6 mg) one hour later if symptoms persist. Maximum dose is 1.8 mg (3 tablets) in 24 hours. Then take one tablet (0.6 mg) daily until symptoms resolve. Patient not taking: Reported on 01/12/2023 08/15/22   [provider]  fluticasone (FLONASE) 50 MCG/ACT nasal spray Place 2 sprays into both nostrils daily. Patient not taking: Reported on 01/12/2023 11/08/17   Ralene Muskrat, FNP  metoprolol tartrate (LOPRESSOR) 25 MG tablet Take by mouth. 04/01/15   [provider]  Spacer/Aero Chamber Mouthpiece MISC One spacer to use with albuterol inhaler. 10/26/17   Ralene Muskrat, FNP    Physical Exam: Vitals:   01/12/23 1534 01/12/23 1600 01/12/23 1700 01/12/23 1704  BP:  92/66 (!) 126/93   Pulse: 96 95 96 94  Resp: 12 15 (!) 23 20  Temp:  99 F (37.2 C)    TempSrc:  Oral    SpO2: 99% 98% 100% 98%  Weight:      Height:       Constitutional: appears age-appropriate, NAD , calm Eyes: PERRL, lids and conjunctivae normal ENMT: Mucous membranes are moist. Posterior pharynx clear of any exudate or lesions. Age-appropriate dentition. Hearing appropriate Neck: normal, supple, no masses, no thyromegaly Respiratory: clear to auscultation bilaterally, no wheezing, no crackles. Normal respiratory effort. No accessory muscle use.  Cardiovascular: Regular rate and rhythm, no murmurs / rubs / gallops. No extremity edema. 2+ pedal pulses. No carotid bruits.  Abdomen: no tenderness, no masses palpated, no hepatosplenomegaly. Bowel sounds positive.  Musculoskeletal: no clubbing / cyanosis. No joint deformity upper and lower extremities. Good ROM, no contractures, no atrophy. Normal muscle tone.  Skin: no rashes, lesions, ulcers. No induration Neurologic: Sensation intact. Strength 5/5 in all 4.  Psychiatric: Normal judgment and insight. Alert and oriented x 3. Normal mood.   EKG: independently reviewed, showing ST elevation in leads II, 3,  aVF  Chest x-ray on Admission: I personally reviewed and I agree with radiologist reading as below.  ECHOCARDIOGRAM COMPLETE  Result Date: 01/12/2023    ECHOCARDIOGRAM REPORT   Patient Name:   Inderpreet Pomrenke. Date of Exam: 01/12/2023 Medical Rec #:  [id number]            Height:       74.0 in Accession #:    [id number]           Weight:       252.2 lb Date of Birth:  07/27/1965            BSA:          2.399 m Patient Age:    57 years             BP:           111/54 mmHg Patient Gender: M                    HR:           95 bpm.  Exam Location:  [facility] Procedure: 2D Echo, Cardiac Doppler and Color Doppler Indications:     Acute myocardial Infarction --unspecified I21.9  History:         Patient has no prior history of Echocardiogram examinations.                  Arrythmias:Atrial Fibrillation; Risk Factors:Dyslipidemia and                  Sleep Apnea.  Sonographer:     Cristela Blue Referring Phys:  Debbe Odea MD Diagnosing Phys: Debbe Odea MD  Sonographer Comments: Technically challenging study due to limited acoustic windows and no parasternal window. IMPRESSIONS  1. Left ventricular ejection fraction, by estimation, is 50 to 55%. Left ventricular ejection fraction by PLAX is 51 %. The left ventricle has low normal function. The left ventricle has no regional wall motion abnormalities. There is mild left ventricular hypertrophy. Left ventricular diastolic parameters are indeterminate.  2. Right ventricular systolic function is low normal. The right ventricular size is mildly enlarged. There is normal pulmonary artery systolic pressure.  3. The mitral valve is normal in structure. Mild mitral valve regurgitation.  4. The aortic valve was not well visualized. Aortic valve regurgitation is trivial.  5. The inferior vena cava is dilated in size with >50% respiratory variability, suggesting right atrial pressure of 8 mmHg. FINDINGS  Left Ventricle: Left ventricular ejection fraction, by estimation, is  50 to 55%. Left ventricular ejection fraction by PLAX is 51 %. The left ventricle has low normal function. The left ventricle has no regional wall motion abnormalities. The left ventricular internal cavity size was normal in size. There is mild left ventricular hypertrophy. Left ventricular diastolic parameters are indeterminate. Right Ventricle: The right ventricular size is mildly enlarged. No increase in right ventricular wall thickness. Right ventricular systolic function is low normal. There is normal pulmonary artery systolic pressure. The tricuspid regurgitant velocity is 2.12 m/s, and with an assumed right atrial pressure of 8 mmHg, the estimated right ventricular systolic pressure is 26.0 mmHg. Left Atrium: Left atrial size was normal in size. Right Atrium: Right atrial size was normal in size. Pericardium: There is no evidence of pericardial effusion. Mitral Valve: The mitral valve is normal in structure. Mild mitral valve regurgitation. Tricuspid Valve: The tricuspid valve is normal in structure. Tricuspid valve regurgitation is not demonstrated. Aortic Valve: The aortic valve was not well visualized. Aortic valve regurgitation is trivial. Aortic valve mean gradient measures 4.0 mmHg. Aortic valve peak gradient measures 7.5 mmHg. Aortic valve area, by VTI measures 2.58 cm. Pulmonic Valve: The pulmonic valve was not well visualized. Pulmonic valve regurgitation is not visualized. Aorta: The aortic root is normal in size and structure. Venous: The inferior vena cava is dilated in size with greater than 50% respiratory variability, suggesting right atrial pressure of 8 mmHg. IAS/Shunts: No atrial level shunt detected by color flow Doppler.  LEFT VENTRICLE PLAX 2D LV EF:         Left ventricular ejection fraction by PLAX is 51 %. LVIDd:         3.90 cm LVIDs:         2.90 cm LV PW:         0.90 cm LV IVS:        1.40 cm LVOT diam:     2.20 cm LV SV:         46 LV SV Index:   19 LVOT Area:  3.80 cm  RIGHT  VENTRICLE RV Basal diam:  4.40 cm RV Mid diam:    2.70 cm RV S prime:     11.50 cm/s TAPSE (M-mode): 1.9 cm LEFT ATRIUM             Index        RIGHT ATRIUM           Index LA diam:        3.00 cm 1.25 cm/m   RA Area:     18.70 cm LA Vol (A2C):   44.9 ml 18.72 ml/m  RA Volume:   52.60 ml  21.93 ml/m LA Vol (A4C):   60.8 ml 25.35 ml/m LA Biplane Vol: 57.3 ml 23.89 ml/m  AORTIC VALVE AV Area (Vmax):    2.46 cm AV Area (Vmean):   2.47 cm AV Area (VTI):     2.58 cm AV Vmax:           137.00 cm/s AV Vmean:          91.200 cm/s AV VTI:            0.178 m AV Peak Grad:      7.5 mmHg AV Mean Grad:      4.0 mmHg LVOT Vmax:         88.60 cm/s LVOT Vmean:        59.200 cm/s LVOT VTI:          0.121 m LVOT/AV VTI ratio: 0.68  AORTA Ao Root diam: 2.80 cm MITRAL VALVE               TRICUSPID VALVE MV Area (PHT): 3.30 cm    TR Peak grad:   18.0 mmHg MV Decel Time: 230 msec    TR Vmax:        212.00 cm/s MV E velocity: 78.60 cm/s                            SHUNTS                            Systemic VTI:  0.12 m                            Systemic Diam: 2.20 cm Debbe Odea MD Electronically signed by Debbe Odea MD Signature Date/Time: 01/12/2023/3:59:22 PM    Final    CARDIAC CATHETERIZATION  Result Date: 01/12/2023 Conclusions: Severe single-vessel coronary artery disease with thrombotic occlusion of mid RCA involving acute marginal branch as well as embolized thrombus in the RPDA.  There is also moderate, nonobstructive mid LAD disease of up to 50%. Moderately reduced left ventricular systolic function (LVEF 35-45%) with inferior akinesis. Mildly elevated left ventricular filling pressure (LVEDP 20 mmHg). Challenging but successful PCI to occluded mid RCA using Onyx frontier 3.0 x 18 mm drug-eluting stent with 0% residual stenosis and TIMI-3 flow.  Embolized thrombus in the PDA improved with angioplasty, though the distal vessel remained occluded at the end of the case.  Difficulty crossing the mid RCA  occlusion consistent with organizing thrombus in the setting of late presenting STEMI. Recommendations: Wean off norepinephrine as tolerated.  Suspect acute RV infarction from RCA/acute marginal disease may be contributing to hypotension. Continue tirofiban infusion for 18 hours.  Recommend starting heparin infusion tomorrow after discontinuation of tirofiban. Given atrial fibrillation and CHA2DS2-VASc score of  at least 2 (coronary artery disease and cardiomyopathy), the patient would benefit from long-term anticoagulation.  Favor transitioning from ticagrelor to clopidogrel prior to discharge, treating with aspirin, clopidogrel, and apixaban for 1 week, and subsequently maintaining apixaban and clopidogrel for 12 months. Aggressive secondary prevention of coronary artery disease. Follow-up echocardiogram. Yvonne Kendall, MD [facility]  DG Chest Port 1 View  Result Date: 01/12/2023 CLINICAL DATA:  Chest pain. EXAM: PORTABLE CHEST 1 VIEW COMPARISON:  X-ray 05/30/2018 FINDINGS: No consolidation, pneumothorax or effusion. Normal cardiopericardial silhouette without edema. Overlapping cardiac leads and defibrillator pads. Old right-sided rib fracture. IMPRESSION: No acute cardiopulmonary disease. Electronically Signed   By: Karen Kays M.D.   On: 01/12/2023 11:17    Labs on Admission: I have personally reviewed following labs CBC: Recent Labs  Lab 01/12/23 1008  WBC 14.6*  HGB 15.4  HCT 42.8  MCV 87.2  PLT 195   Basic Metabolic Panel: Recent Labs  Lab 01/12/23 1008  NA 135  K 4.0  CL 99  CO2 24  GLUCOSE 182*  BUN 18  CREATININE 1.29*  CALCIUM 8.9   GFR: Estimated Creatinine Clearance: 84 mL/min (A) (by C-G formula based on SCr of 1.29 mg/dL (H)).  Recent Labs  Lab 01/12/23 1008  LIPASE 27   CBG: Recent Labs  Lab 01/12/23 1236  GLUCAP 166*   Lipid Profile: Recent Labs    01/12/23 1008  CHOL 174  HDL 51  LDLCALC 102*  TRIG 104  CHOLHDL 3.4   Urine analysis:     Component Value Date/Time   COLORURINE AMBER (A) 05/30/2018 1644   APPEARANCEUR HAZY (A) 05/30/2018 1644   LABSPEC 1.030 05/30/2018 1644   PHURINE 5.0 05/30/2018 1644   GLUCOSEU NEGATIVE 05/30/2018 1644   HGBUR NEGATIVE 05/30/2018 1644   BILIRUBINUR NEGATIVE 05/30/2018 1644   KETONESUR 5 (A) 05/30/2018 1644   PROTEINUR NEGATIVE 05/30/2018 1644   NITRITE NEGATIVE 05/30/2018 1644   LEUKOCYTESUR NEGATIVE 05/30/2018 1644   This document was prepared using Dragon Voice Recognition software and may include unintentional dictation errors.  Dr. Sedalia Muta [facility]  If 7PM-7AM, please contact overnight-coverage provider If 7AM-7PM, please contact day attending provider [email or web address]  01/12/2023, 5:41 PM

## 2023-01-12 NOTE — Consult Note (Signed)
ANTICOAGULATION CONSULT NOTE  Pharmacy Consult for IV Heparin Indication: atrial fibrillation  Patient Measurements: Height: 6\' 2"  (188 cm) Weight: 114.4 kg (252 lb 3.3 oz) IBW/kg (Calculated) : 82.2 Heparin Dosing Weight: 105.5 kg  Labs: Recent Labs    01/12/23 1008 01/12/23 1251  HGB 15.4  --   HCT 42.8  --   PLT 195  --   CREATININE 1.29*  --   TROPONINIHS >24,000* >24,000*   Estimated Creatinine Clearance: 84 mL/min (A) (by C-G formula based on SCr of 1.29 mg/dL (H)).  Medical History: Past Medical History:  Diagnosis Date   Anxiety    Atrial fibrillation (HCC)    Dental crowns present    FRONT TWO TEETH   Deviated septum    Dysrhythmia    A-FIB/ DR Gwen Pounds   Hyperlipidemia    Hypertrophy of nasal turbinates    Meningitis    Obesity    Rhinitis, allergic    Seasonal allergies    Sleep apnea    C-PAP    Medications:  No anticoagulation prior to admission per my chart review  Assessment: 57 y/o M with medical history as above admitted with STEMI. Patient taken emergently for cardiac catheterization. Patient placed on Aggrastat gtt in cath lab to run x 18h in addition to DAPT. Patient with a history of atrial fibrillation not on anticoagulation. Pharmacy consulted to initiate and manage heparin infusion for Afib (after completion of Aggrastat gtt).  Baseline H&H, platelets within normal limits. Will put in to check aPTT and PT-INR with AM labs tomorrow.  Goal of Therapy:  Heparin level 0.3-0.7 units/ml Monitor platelets by anticoagulation protocol: Yes   Plan:  --Aggrastat gtt to end 8/10 at 0530, will start heparin at 0600 --Heparin 4000 unit IV bolus followed by continuous infusion at 1500 units/hr --Heparin level 6 hours from initiation of infusion --Daily CBC per protocol while on IV heparin  Tressie Ellis 01/12/2023,2:35 PM

## 2023-01-12 NOTE — ED Triage Notes (Signed)
Pt to ED from [facility] for chest pressure, abd distention, emesis for 4 days. KC reports pt hypotensive and febrile. Pt afebrile on arrival. Emesis started this am.

## 2023-01-12 NOTE — Progress Notes (Signed)
ANTICOAGULATION CONSULT NOTE - Initial Consult  Pharmacy Consult for heparin Indication: chest pain/ACS  Allergies  Allergen Reactions   Azithromycin Other (See Comments)    Drug interacts with current daily medication.    Patient Measurements: Height: 6\' 2"  (188 cm) Weight: 112 kg (247 lb) IBW/kg (Calculated) : 82.2 Heparin Dosing Weight: 105.5 kg  Vital Signs: Temp: 98.8 F (37.1 C) (08/09 0959) BP: 122/82 (08/09 1010) Pulse Rate: 87 (08/09 1025)  Labs: Recent Labs    01/12/23 1008  HGB 15.4  HCT 42.8  PLT 195    CrCl cannot be calculated (Patient's most recent lab result is older than the maximum 21 days allowed.).   Medical History: Past Medical History:  Diagnosis Date   Anxiety    Atrial fibrillation (HCC)    Dental crowns present    FRONT TWO TEETH   Deviated septum    Dysrhythmia    A-FIB/ DR Gwen Pounds   Hyperlipidemia    Hypertrophy of nasal turbinates    Meningitis    Obesity    Rhinitis, allergic    Seasonal allergies    Sleep apnea    C-PAP   Assessment: 57 y/o male with PMH significant for atrial fibrillation (not on anticoagulation PTA) presenting with chest pain. Pharmacy has been consulted to start heparin infusion. Hgb 15.4, plt WNL - no signs/symptoms of bleeding noted.  Goal of Therapy:  Heparin level 0.3-0.7 units/ml Monitor platelets by anticoagulation protocol: Yes   Plan:  Heparin bolus of 4000 units x1 given in ED Start heparin infusion at 1400 units/hour Check 6-hour Anti-Xa level Monitor daily Anti-Xa levels while on heparin infusion Monitor CBC and signs/symptoms of bleeding  Thank you for involving pharmacy in this patient's care.   Rockwell Alexandria, PharmD Clinical Pharmacist 01/12/2023 10:47 AM

## 2023-01-12 NOTE — Assessment & Plan Note (Signed)
Ativan 0.5 mg IV every 6 hours as needed for anxiety, 20 hours of coverage ordered

## 2023-01-12 NOTE — ED Notes (Signed)
Pt tx to Cath lab on [facility].

## 2023-01-13 ENCOUNTER — Inpatient Hospital Stay: Payer: BC Managed Care – PPO

## 2023-01-13 DIAGNOSIS — I2111 ST elevation (STEMI) myocardial infarction involving right coronary artery: Secondary | ICD-10-CM | POA: Diagnosis not present

## 2023-01-13 LAB — HEPARIN LEVEL (UNFRACTIONATED): Heparin Unfractionated: <0.1 IU/mL — ABNORMAL LOW (ref 0.30–0.70)

## 2023-01-13 MED ORDER — LISINOPRIL 5 MG PO TABS
2.5000 mg | ORAL_TABLET | Freq: Every day | ORAL | Status: DC
  Administered 2023-01-13 – 2023-01-14 (×2): 2.5 mg via ORAL
  Filled 2023-01-13 (×2): qty 1

## 2023-01-13 MED ORDER — FUROSEMIDE 10 MG/ML IJ SOLN
20.0000 mg | Freq: Once | INTRAMUSCULAR | Status: AC
  Administered 2023-01-13: 20 mg via INTRAVENOUS
  Filled 2023-01-13: qty 2

## 2023-01-13 MED ORDER — AMIODARONE LOAD VIA INFUSION
150.0000 mg | Freq: Once | INTRAVENOUS | Status: AC
  Administered 2023-01-13: 150 mg via INTRAVENOUS
  Filled 2023-01-13: qty 83.34

## 2023-01-13 MED ORDER — GUAIFENESIN 100 MG/5ML PO LIQD
5.0000 mL | ORAL | Status: DC | PRN
  Administered 2023-01-13 – 2023-01-15 (×2): 5 mL via ORAL
  Filled 2023-01-13 (×2): qty 10

## 2023-01-13 MED ORDER — SENNOSIDES-DOCUSATE SODIUM 8.6-50 MG PO TABS
1.0000 | ORAL_TABLET | Freq: Two times a day (BID) | ORAL | Status: DC | PRN
  Administered 2023-01-15 – 2023-01-16 (×2): 1 via ORAL
  Filled 2023-01-13: qty 1

## 2023-01-13 MED ORDER — AMIODARONE HCL IN DEXTROSE 360-4.14 MG/200ML-% IV SOLN
30.0000 mg/h | INTRAVENOUS | Status: DC
  Administered 2023-01-14 – 2023-01-15 (×3): 30 mg/h via INTRAVENOUS
  Filled 2023-01-13 (×4): qty 200

## 2023-01-13 MED ORDER — BENZONATATE 100 MG PO CAPS
200.0000 mg | ORAL_CAPSULE | Freq: Three times a day (TID) | ORAL | Status: DC
  Administered 2023-01-13 – 2023-01-16 (×10): 200 mg via ORAL
  Filled 2023-01-13 (×10): qty 2

## 2023-01-13 MED ORDER — HEPARIN BOLUS VIA INFUSION
3000.0000 [IU] | Freq: Once | INTRAVENOUS | Status: AC
  Administered 2023-01-13: 3000 [IU] via INTRAVENOUS
  Filled 2023-01-13: qty 3000

## 2023-01-13 MED ORDER — MORPHINE SULFATE (PF) 2 MG/ML IV SOLN
2.0000 mg | INTRAVENOUS | Status: DC | PRN
  Administered 2023-01-13: 2 mg via INTRAVENOUS
  Filled 2023-01-13: qty 1

## 2023-01-13 MED ORDER — OXYCODONE HCL 5 MG PO TABS
5.0000 mg | ORAL_TABLET | ORAL | Status: DC | PRN
  Administered 2023-01-13 – 2023-01-15 (×2): 5 mg via ORAL
  Filled 2023-01-13 (×2): qty 1

## 2023-01-13 MED ORDER — METOPROLOL TARTRATE 25 MG PO TABS
25.0000 mg | ORAL_TABLET | Freq: Two times a day (BID) | ORAL | Status: DC
  Administered 2023-01-13: 25 mg via ORAL
  Filled 2023-01-13: qty 1

## 2023-01-13 MED ORDER — AMIODARONE HCL IN DEXTROSE 360-4.14 MG/200ML-% IV SOLN
60.0000 mg/h | INTRAVENOUS | Status: DC
  Administered 2023-01-13 (×2): 60 mg/h via INTRAVENOUS
  Filled 2023-01-13 (×2): qty 200

## 2023-01-13 MED ORDER — LORAZEPAM 2 MG/ML IJ SOLN: 0.5000 mg | Freq: Four times a day (QID) | INTRAMUSCULAR | Status: AC | PRN

## 2023-01-13 MED ORDER — ALUM & MAG HYDROXIDE-SIMETH 200-200-20 MG/5ML PO SUSP
30.0000 mL | Freq: Four times a day (QID) | ORAL | Status: DC | PRN
  Administered 2023-01-13: 30 mL via ORAL
  Filled 2023-01-13: qty 30

## 2023-01-13 MED ORDER — METOPROLOL TARTRATE 25 MG PO TABS
25.0000 mg | ORAL_TABLET | Freq: Three times a day (TID) | ORAL | Status: DC
  Administered 2023-01-13 – 2023-01-14 (×3): 25 mg via ORAL
  Filled 2023-01-13 (×3): qty 1

## 2023-01-13 MED ORDER — IPRATROPIUM-ALBUTEROL 0.5-2.5 (3) MG/3ML IN SOLN
3.0000 mL | Freq: Four times a day (QID) | RESPIRATORY_TRACT | Status: DC | PRN
  Administered 2023-01-13: 3 mL via RESPIRATORY_TRACT
  Filled 2023-01-13: qty 3

## 2023-01-13 NOTE — Progress Notes (Signed)
PROGRESS NOTE    Keith Torres.  ZOX:[id number] DOB: 1965-09-20 DOA: 01/12/2023 PCP: Dione Housekeeper, MD    Brief Narrative:  57 year old male with history of hypertension, atrial fibrillation was on flecainide, who presents to the emergency department for chief concerns of chest pressure, vomiting, abdominal distention.  High sensitive troponin is greater than 24,000 EKG with inferolateral STEMI.  STEMI cardiologist called to bedside.  Patient taken emergently to Cath Lab for PCI.  Status post PCI with DES to mid RCA.  Admitted to ICU on tirofiban infusion post procedurally.  Required vasopressor support for short period of time.  8/10: Doing well overall.  In rapid atrial fibrillation.  Reports of intermittent chest tightness.  Vital signs stable.  Wife at bedside.  Assessment & Plan:   Principal Problem:   STEMI involving right coronary artery (HCC) Active Problems:   Anxiety   Breathlessness on exertion   Adiposity   Paroxysmal atrial fibrillation (HCC)   ST elevation myocardial infarction involving right coronary artery (HCC)   AKI (acute kidney injury) (HCC)   CAD S/P percutaneous coronary angioplasty   STEMI involving right coronary artery (HCC) Status post left heart cath with PCI to RCA Per cardiology recommendation, continue to tirofiban infusion for 18 hours followed by heparin infusion Complete echo ordered Plan: Continue heparin GTT Aggressive goal-directed medical therapy Per cardiology recommendations aspirin Plavix and apixaban x 1 week followed by apixaban and clopidogrel for 12 months Cardiology follow-up Telemetry monitoring Can likely transfer to to a later today   CAD S/P percutaneous coronary angioplasty Recommended apixaban and clopidogrel for 12 months post discharge Aggressive secondary coronary artery disease prevention   AKI (acute kidney injury) (HCC) Suspect secondary to cardiorenal Kidney function returned to baseline   Paroxysmal  atrial fibrillation (HCC) Patient currently on heparin gtt..  Will continue for today.  Will likely transition to p.o. Eliquis at time of discharge.  Patient previously maintained on flecainide.  No longer a possibility given ischemic heart disease.  Currently on metoprolol 12.5 mg twice daily.  Will increase to 25 mg twice daily to achieve rate control.  Dyspnea on exertion Secondary to STEMI, treat per above   Anxiety As needed IV Ativan   DVT prophylaxis: Heparin GTT Code Status: Full Family Communication: Spouse at bedside 8/10 Disposition Plan: Status is: Inpatient Remains inpatient appropriate because: STEMI.  Postcatheterization monitoring.  Possible discharge in 24 hours.   Level of care: ICU  Consultants:  Cardiology  Procedures:  Left heart catheterization.  PCI with DES 8/9  Antimicrobials: None   Subjective: Seen and examined.  Resting comfortably in bed.  Reports intermittent chest tightness and shortness of breath.  Objective: Vitals:   01/13/23 0725 01/13/23 0800 01/13/23 0900 01/13/23 1000  BP: 91/78 96/75 105/82 97/77  Pulse: (!) 140 (!) 134 (!) 140 (!) 126  Resp: 19 (!) 26    Temp: 98.9 F (37.2 C)     TempSrc: Oral     SpO2: 97% 97% 96% 96%  Weight:      Height:        Intake/Output Summary (Last 24 hours) at 01/13/2023 1122 Last data filed at 01/13/2023 1013 Gross per 24 hour  Intake 1640.91 ml  Output 725 ml  Net 915.91 ml   Filed Weights   01/12/23 0959 01/12/23 1234 01/13/23 0453  Weight: 112 kg 114.4 kg 112.3 kg    Examination:  General exam: Appears calm and comfortable  Respiratory system: Lungs clear.  Normal work of  breathing.  Room air Cardiovascular system: S1-S2, tachycardic, irregular rhythm, no murmurs, no pedal edema Gastrointestinal system: Soft, NT/ND, normal bowel sounds Central nervous system: Alert and oriented. No focal neurological deficits. Extremities: Symmetric 5 x 5 power. Skin: No rashes, lesions or  ulcers Psychiatry: Judgement and insight appear normal. Mood & affect appropriate.     Data Reviewed: I have personally reviewed following labs and imaging studies  CBC: Recent Labs  Lab 01/12/23 1008 01/13/23 0421  WBC 14.6* 11.3*  HGB 15.4 14.2  HCT 42.8 39.8  MCV 87.2 89.6  PLT 195 172   Basic Metabolic Panel: Recent Labs  Lab 01/12/23 1008 01/13/23 0421  NA 135 132*  K 4.0 4.0  CL 99 97*  CO2 24 26  GLUCOSE 182* 173*  BUN 18 19  CREATININE 1.29* 1.12  CALCIUM 8.9 8.5*   GFR: Estimated Creatinine Clearance: 95.8 mL/min (by C-G formula based on SCr of 1.12 mg/dL). Liver Function Tests: No results for input(s): "AST", "ALT", "ALKPHOS", "BILITOT", "PROT", "ALBUMIN" in the last 168 hours. Recent Labs  Lab 01/12/23 1008  LIPASE 27   No results for input(s): "AMMONIA" in the last 168 hours. Coagulation Profile: No results for input(s): "INR", "PROTIME" in the last 168 hours. Cardiac Enzymes: No results for input(s): "CKTOTAL", "CKMB", "CKMBINDEX", "TROPONINI" in the last 168 hours. BNP (last 3 results) No results for input(s): "PROBNP" in the last 8760 hours. HbA1C: Recent Labs    01/13/23 0421  HGBA1C 5.4   CBG: Recent Labs  Lab 01/12/23 1236  GLUCAP 166*   Lipid Profile: Recent Labs    01/12/23 1008 01/13/23 0421  CHOL 174 149  HDL 51 54  LDLCALC 102* 77  TRIG 104 90  CHOLHDL 3.4 2.8   Thyroid Function Tests: No results for input(s): "TSH", "T4TOTAL", "FREET4", "T3FREE", "THYROIDAB" in the last 72 hours. Anemia Panel: No results for input(s): "VITAMINB12", "FOLATE", "FERRITIN", "TIBC", "IRON", "RETICCTPCT" in the last 72 hours. Sepsis Labs: No results for input(s): "PROCALCITON", "LATICACIDVEN" in the last 168 hours.  Recent Results (from the past 240 hour(s))  MRSA Next Gen by PCR, Nasal     Status: None   Collection Time: 01/12/23 12:37 PM   Specimen: Nasal Mucosa; Nasal Swab  Result Value Ref Range Status   MRSA by PCR Next Gen NOT  DETECTED NOT DETECTED Final    Comment: (NOTE) The GeneXpert MRSA Assay (FDA approved for NASAL specimens only), is one component of a comprehensive MRSA colonization surveillance program. It is not intended to diagnose MRSA infection nor to guide or monitor treatment for MRSA infections. Test performance is not FDA approved in patients less than 92 years old. Performed at [facility], [address], [place], [place] [address]          Radiology Studies: ECHOCARDIOGRAM COMPLETE  Result Date: 01/12/2023    ECHOCARDIOGRAM REPORT   Patient Name:   Keith Torres. Date of Exam: 01/12/2023 Medical Rec #:  [id number]            Height:       74.0 in Accession #:    [id number]           Weight:       252.2 lb Date of Birth:  [birth year]            BSA:          2.399 m Patient Age:    36 years  BP:           111/54 mmHg Patient Gender: M                    HR:           95 bpm. Exam Location:  [facility] Procedure: 2D Echo, Cardiac Doppler and Color Doppler Indications:     Acute myocardial Infarction --unspecified I21.9  History:         Patient has no prior history of Echocardiogram examinations.                  Arrythmias:Atrial Fibrillation; Risk Factors:Dyslipidemia and                  Sleep Apnea.  Sonographer:     Cristela Blue Referring Phys:  Debbe Odea MD Diagnosing Phys: Debbe Odea MD  Sonographer Comments: Technically challenging study due to limited acoustic windows and no parasternal window. IMPRESSIONS  1. Left ventricular ejection fraction, by estimation, is 50 to 55%. Left ventricular ejection fraction by PLAX is 51 %. The left ventricle has low normal function. The left ventricle has no regional wall motion abnormalities. There is mild left ventricular hypertrophy. Left ventricular diastolic parameters are indeterminate.  2. Right ventricular systolic function is low normal. The right ventricular size is mildly enlarged. There is normal pulmonary artery  systolic pressure.  3. The mitral valve is normal in structure. Mild mitral valve regurgitation.  4. The aortic valve was not well visualized. Aortic valve regurgitation is trivial.  5. The inferior vena cava is dilated in size with >50% respiratory variability, suggesting right atrial pressure of 8 mmHg. FINDINGS  Left Ventricle: Left ventricular ejection fraction, by estimation, is 50 to 55%. Left ventricular ejection fraction by PLAX is 51 %. The left ventricle has low normal function. The left ventricle has no regional wall motion abnormalities. The left ventricular internal cavity size was normal in size. There is mild left ventricular hypertrophy. Left ventricular diastolic parameters are indeterminate. Right Ventricle: The right ventricular size is mildly enlarged. No increase in right ventricular wall thickness. Right ventricular systolic function is low normal. There is normal pulmonary artery systolic pressure. The tricuspid regurgitant velocity is 2.12 m/s, and with an assumed right atrial pressure of 8 mmHg, the estimated right ventricular systolic pressure is 26.0 mmHg. Left Atrium: Left atrial size was normal in size. Right Atrium: Right atrial size was normal in size. Pericardium: There is no evidence of pericardial effusion. Mitral Valve: The mitral valve is normal in structure. Mild mitral valve regurgitation. Tricuspid Valve: The tricuspid valve is normal in structure. Tricuspid valve regurgitation is not demonstrated. Aortic Valve: The aortic valve was not well visualized. Aortic valve regurgitation is trivial. Aortic valve mean gradient measures 4.0 mmHg. Aortic valve peak gradient measures 7.5 mmHg. Aortic valve area, by VTI measures 2.58 cm. Pulmonic Valve: The pulmonic valve was not well visualized. Pulmonic valve regurgitation is not visualized. Aorta: The aortic root is normal in size and structure. Venous: The inferior vena cava is dilated in size with greater than 50% respiratory  variability, suggesting right atrial pressure of 8 mmHg. IAS/Shunts: No atrial level shunt detected by color flow Doppler.  LEFT VENTRICLE PLAX 2D LV EF:         Left ventricular ejection fraction by PLAX is 51 %. LVIDd:         3.90 cm LVIDs:         2.90 cm LV PW:  0.90 cm LV IVS:        1.40 cm LVOT diam:     2.20 cm LV SV:         46 LV SV Index:   19 LVOT Area:     3.80 cm  RIGHT VENTRICLE RV Basal diam:  4.40 cm RV Mid diam:    2.70 cm RV S prime:     11.50 cm/s TAPSE (M-mode): 1.9 cm LEFT ATRIUM             Index        RIGHT ATRIUM           Index LA diam:        3.00 cm 1.25 cm/m   RA Area:     18.70 cm LA Vol (A2C):   44.9 ml 18.72 ml/m  RA Volume:   52.60 ml  21.93 ml/m LA Vol (A4C):   60.8 ml 25.35 ml/m LA Biplane Vol: 57.3 ml 23.89 ml/m  AORTIC VALVE AV Area (Vmax):    2.46 cm AV Area (Vmean):   2.47 cm AV Area (VTI):     2.58 cm AV Vmax:           137.00 cm/s AV Vmean:          91.200 cm/s AV VTI:            0.178 m AV Peak Grad:      7.5 mmHg AV Mean Grad:      4.0 mmHg LVOT Vmax:         88.60 cm/s LVOT Vmean:        59.200 cm/s LVOT VTI:          0.121 m LVOT/AV VTI ratio: 0.68  AORTA Ao Root diam: 2.80 cm MITRAL VALVE               TRICUSPID VALVE MV Area (PHT): 3.30 cm    TR Peak grad:   18.0 mmHg MV Decel Time: 230 msec    TR Vmax:        212.00 cm/s MV E velocity: 78.60 cm/s                            SHUNTS                            Systemic VTI:  0.12 m                            Systemic Diam: 2.20 cm Debbe Odea MD Electronically signed by Debbe Odea MD Signature Date/Time: 01/12/2023/3:59:22 PM    Final    CARDIAC CATHETERIZATION  Result Date: 01/12/2023 Conclusions: Severe single-vessel coronary artery disease with thrombotic occlusion of mid RCA involving acute marginal branch as well as embolized thrombus in the RPDA.  There is also moderate, nonobstructive mid LAD disease of up to 50%. Moderately reduced left ventricular systolic function (LVEF 35-45%)  with inferior akinesis. Mildly elevated left ventricular filling pressure (LVEDP 20 mmHg). Challenging but successful PCI to occluded mid RCA using Onyx frontier 3.0 x 18 mm drug-eluting stent with 0% residual stenosis and TIMI-3 flow.  Embolized thrombus in the PDA improved with angioplasty, though the distal vessel remained occluded at the end of the case.  Difficulty crossing the mid RCA occlusion consistent with organizing thrombus in the setting of late presenting STEMI.  Recommendations: Wean off norepinephrine as tolerated.  Suspect acute RV infarction from RCA/acute marginal disease may be contributing to hypotension. Continue tirofiban infusion for 18 hours.  Recommend starting heparin infusion tomorrow after discontinuation of tirofiban. Given atrial fibrillation and CHA2DS2-VASc score of at least 2 (coronary artery disease and cardiomyopathy), the patient would benefit from long-term anticoagulation.  Favor transitioning from ticagrelor to clopidogrel prior to discharge, treating with aspirin, clopidogrel, and apixaban for 1 week, and subsequently maintaining apixaban and clopidogrel for 12 months. Aggressive secondary prevention of coronary artery disease. Follow-up echocardiogram. Yvonne Kendall, MD [facility]  DG Chest Port 1 View  Result Date: 01/12/2023 CLINICAL DATA:  Chest pain. EXAM: PORTABLE CHEST 1 VIEW COMPARISON:  X-ray 05/30/2018 FINDINGS: No consolidation, pneumothorax or effusion. Normal cardiopericardial silhouette without edema. Overlapping cardiac leads and defibrillator pads. Old right-sided rib fracture. IMPRESSION: No acute cardiopulmonary disease. Electronically Signed   By: Karen Kays M.D.   On: 01/12/2023 11:17        Scheduled Meds:  aspirin  81 mg Oral Daily   atorvastatin  40 mg Oral Daily   benzonatate  200 mg Oral TID   Chlorhexidine Gluconate Cloth  6 each Topical Q0600   [START ON 01/14/2023] clopidogrel  75 mg Oral Daily   metoprolol tartrate  25 mg Oral  BID   sodium chloride flush  3 mL Intravenous Q12H   Continuous Infusions:  sodium chloride     sodium chloride     heparin 1,500 Units/hr (01/13/23 1013)   promethazine (PHENERGAN) injection (IM or IVPB)       LOS: 1 day     Tresa Moore, MD [facility]   If 7PM-7AM, please contact night-coverage  01/13/2023, 11:22 AM

## 2023-01-13 NOTE — Progress Notes (Signed)
Patient reports relief from interventions for SOB. Resting in bed comfortably at this time. Will continue to monitor closely. MD Sreenath reviewed chest xray, no acute changes noted.

## 2023-01-13 NOTE — Progress Notes (Signed)
Patient ID: Keith Hanneman., male   DOB: [birth year], 57 y.o.   MRN: [id number] [facility]    SUBJECTIVE: Patient resting comfortably sleeping in bed family in room feels reasonably well had an episode of shortness of breath and dyspnea left evening somewhat improved no chest pain has a little bit of tachycardia with atrial fibrillation   Vitals:   01/13/23 0800 01/13/23 0900 01/13/23 1000 01/13/23 1100  BP: 96/75 105/82 97/77 95/77   Pulse: (!) 134 (!) 140 (!) 126 (!) 123  Resp: (!) 26     Temp:      TempSrc:      SpO2: 97% 96% 96% 95%  Weight:      Height:         Intake/Output Summary (Last 24 hours) at 01/13/2023 1152 Last data filed at 01/13/2023 1013 Gross per 24 hour  Intake 1640.91 ml  Output 725 ml  Net 915.91 ml      PHYSICAL EXAM  General: Well developed, well nourished, in no acute distress HEENT:  Normocephalic and atramatic Neck:  No JVD.  Lungs: Clear bilaterally to auscultation and percussion. Heart: Irregular irregular tachycardic. Normal S1 and S2 without gallops or murmurs.  Abdomen: Bowel sounds are positive, abdomen soft and non-tender  Msk:  Back normal, normal gait. Normal strength and tone for age. Extremities: No clubbing, cyanosis or edema.   Neuro: Alert and oriented X 3. Psych:  Good affect, responds appropriately   LABS: Basic Metabolic Panel: Recent Labs    01/12/23 1008 01/13/23 0421  NA 135 132*  K 4.0 4.0  CL 99 97*  CO2 24 26  GLUCOSE 182* 173*  BUN 18 19  CREATININE 1.29* 1.12  CALCIUM 8.9 8.5*   Liver Function Tests: No results for input(s): "AST", "ALT", "ALKPHOS", "BILITOT", "PROT", "ALBUMIN" in the last 72 hours. Recent Labs    01/12/23 1008  LIPASE 27   CBC: Recent Labs    01/12/23 1008 01/13/23 0421  WBC 14.6* 11.3*  HGB 15.4 14.2  HCT 42.8 39.8  MCV 87.2 89.6  PLT 195 172   Cardiac Enzymes: No results for input(s): "CKTOTAL", "CKMB", "CKMBINDEX", "TROPONINI" in the last 72 hours. BNP: Invalid  input(s): "POCBNP" D-Dimer: No results for input(s): "DDIMER" in the last 72 hours. Hemoglobin A1C: Recent Labs    01/13/23 0421  HGBA1C 5.4   Fasting Lipid Panel: Recent Labs    01/13/23 0421  CHOL 149  HDL 54  LDLCALC 77  TRIG 90  CHOLHDL 2.8   Thyroid Function Tests: No results for input(s): "TSH", "T4TOTAL", "T3FREE", "THYROIDAB" in the last 72 hours.  Invalid input(s): "FREET3" Anemia Panel: No results for input(s): "VITAMINB12", "FOLATE", "FERRITIN", "TIBC", "IRON", "RETICCTPCT" in the last 72 hours.  DG Chest Port 1 View  Result Date: 01/13/2023 CLINICAL DATA:  57 year old male with shortness of breath. EXAM: PORTABLE CHEST 1 VIEW COMPARISON:  Portable chest 01/12/2023 and earlier. FINDINGS: Portable AP upright view at 1122 hours. Chronically low lung volumes. Mediastinal contours remain normal. Visualized tracheal air column is within normal limits. Allowing for portable technique the lungs are clear. No pneumothorax or pleural effusion. No acute osseous abnormality identified. Negative visible bowel gas. IMPRESSION: No acute cardiopulmonary abnormality. Electronically Signed   By: Odessa Fleming M.D.   On: 01/13/2023 11:28   ECHOCARDIOGRAM COMPLETE  Result Date: 01/12/2023    ECHOCARDIOGRAM REPORT   Patient Name:   Keith Torres. Date of Exam: 01/12/2023 Medical Rec #:  [id number]  Height:       74.0 in Accession #:    [id number]           Weight:       252.2 lb Date of Birth:  September 18, 1965            BSA:          2.399 m Patient Age:    57 years             BP:           111/54 mmHg Patient Gender: M                    HR:           95 bpm. Exam Location:  [facility] Procedure: 2D Echo, Cardiac Doppler and Color Doppler Indications:     Acute myocardial Infarction --unspecified I21.9  History:         Patient has no prior history of Echocardiogram examinations.                  Arrythmias:Atrial Fibrillation; Risk Factors:Dyslipidemia and                  Sleep Apnea.   Sonographer:     Cristela Blue Referring Phys:  Debbe Odea MD Diagnosing Phys: Debbe Odea MD  Sonographer Comments: Technically challenging study due to limited acoustic windows and no parasternal window. IMPRESSIONS  1. Left ventricular ejection fraction, by estimation, is 50 to 55%. Left ventricular ejection fraction by PLAX is 51 %. The left ventricle has low normal function. The left ventricle has no regional wall motion abnormalities. There is mild left ventricular hypertrophy. Left ventricular diastolic parameters are indeterminate.  2. Right ventricular systolic function is low normal. The right ventricular size is mildly enlarged. There is normal pulmonary artery systolic pressure.  3. The mitral valve is normal in structure. Mild mitral valve regurgitation.  4. The aortic valve was not well visualized. Aortic valve regurgitation is trivial.  5. The inferior vena cava is dilated in size with >50% respiratory variability, suggesting right atrial pressure of 8 mmHg. FINDINGS  Left Ventricle: Left ventricular ejection fraction, by estimation, is 50 to 55%. Left ventricular ejection fraction by PLAX is 51 %. The left ventricle has low normal function. The left ventricle has no regional wall motion abnormalities. The left ventricular internal cavity size was normal in size. There is mild left ventricular hypertrophy. Left ventricular diastolic parameters are indeterminate. Right Ventricle: The right ventricular size is mildly enlarged. No increase in right ventricular wall thickness. Right ventricular systolic function is low normal. There is normal pulmonary artery systolic pressure. The tricuspid regurgitant velocity is 2.12 m/s, and with an assumed right atrial pressure of 8 mmHg, the estimated right ventricular systolic pressure is 26.0 mmHg. Left Atrium: Left atrial size was normal in size. Right Atrium: Right atrial size was normal in size. Pericardium: There is no evidence of pericardial  effusion. Mitral Valve: The mitral valve is normal in structure. Mild mitral valve regurgitation. Tricuspid Valve: The tricuspid valve is normal in structure. Tricuspid valve regurgitation is not demonstrated. Aortic Valve: The aortic valve was not well visualized. Aortic valve regurgitation is trivial. Aortic valve mean gradient measures 4.0 mmHg. Aortic valve peak gradient measures 7.5 mmHg. Aortic valve area, by VTI measures 2.58 cm. Pulmonic Valve: The pulmonic valve was not well visualized. Pulmonic valve regurgitation is not visualized. Aorta: The aortic root is normal in size and structure. Venous:  The inferior vena cava is dilated in size with greater than 50% respiratory variability, suggesting right atrial pressure of 8 mmHg. IAS/Shunts: No atrial level shunt detected by color flow Doppler.  LEFT VENTRICLE PLAX 2D LV EF:         Left ventricular ejection fraction by PLAX is 51 %. LVIDd:         3.90 cm LVIDs:         2.90 cm LV PW:         0.90 cm LV IVS:        1.40 cm LVOT diam:     2.20 cm LV SV:         46 LV SV Index:   19 LVOT Area:     3.80 cm  RIGHT VENTRICLE RV Basal diam:  4.40 cm RV Mid diam:    2.70 cm RV S prime:     11.50 cm/s TAPSE (M-mode): 1.9 cm LEFT ATRIUM             Index        RIGHT ATRIUM           Index LA diam:        3.00 cm 1.25 cm/m   RA Area:     18.70 cm LA Vol (A2C):   44.9 ml 18.72 ml/m  RA Volume:   52.60 ml  21.93 ml/m LA Vol (A4C):   60.8 ml 25.35 ml/m LA Biplane Vol: 57.3 ml 23.89 ml/m  AORTIC VALVE AV Area (Vmax):    2.46 cm AV Area (Vmean):   2.47 cm AV Area (VTI):     2.58 cm AV Vmax:           137.00 cm/s AV Vmean:          91.200 cm/s AV VTI:            0.178 m AV Peak Grad:      7.5 mmHg AV Mean Grad:      4.0 mmHg LVOT Vmax:         88.60 cm/s LVOT Vmean:        59.200 cm/s LVOT VTI:          0.121 m LVOT/AV VTI ratio: 0.68  AORTA Ao Root diam: 2.80 cm MITRAL VALVE               TRICUSPID VALVE MV Area (PHT): 3.30 cm    TR Peak grad:   18.0 mmHg MV  Decel Time: 230 msec    TR Vmax:        212.00 cm/s MV E velocity: 78.60 cm/s                            SHUNTS                            Systemic VTI:  0.12 m                            Systemic Diam: 2.20 cm Debbe Odea MD Electronically signed by Debbe Odea MD Signature Date/Time: 01/12/2023/3:59:22 PM    Final    CARDIAC CATHETERIZATION  Result Date: 01/12/2023 Conclusions: Severe single-vessel coronary artery disease with thrombotic occlusion of mid RCA involving acute marginal branch as well as embolized thrombus in the RPDA.  There is also moderate, nonobstructive mid LAD disease  of up to 50%. Moderately reduced left ventricular systolic function (LVEF 35-45%) with inferior akinesis. Mildly elevated left ventricular filling pressure (LVEDP 20 mmHg). Challenging but successful PCI to occluded mid RCA using Onyx frontier 3.0 x 18 mm drug-eluting stent with 0% residual stenosis and TIMI-3 flow.  Embolized thrombus in the PDA improved with angioplasty, though the distal vessel remained occluded at the end of the case.  Difficulty crossing the mid RCA occlusion consistent with organizing thrombus in the setting of late presenting STEMI. Recommendations: Wean off norepinephrine as tolerated.  Suspect acute RV infarction from RCA/acute marginal disease may be contributing to hypotension. Continue tirofiban infusion for 18 hours.  Recommend starting heparin infusion tomorrow after discontinuation of tirofiban. Given atrial fibrillation and CHA2DS2-VASc score of at least 2 (coronary artery disease and cardiomyopathy), the patient would benefit from long-term anticoagulation.  Favor transitioning from ticagrelor to clopidogrel prior to discharge, treating with aspirin, clopidogrel, and apixaban for 1 week, and subsequently maintaining apixaban and clopidogrel for 12 months. Aggressive secondary prevention of coronary artery disease. Follow-up echocardiogram. Yvonne Kendall, MD [facility]  DG Chest  Port 1 View  Result Date: 01/12/2023 CLINICAL DATA:  Chest pain. EXAM: PORTABLE CHEST 1 VIEW COMPARISON:  X-ray 05/30/2018 FINDINGS: No consolidation, pneumothorax or effusion. Normal cardiopericardial silhouette without edema. Overlapping cardiac leads and defibrillator pads. Old right-sided rib fracture. IMPRESSION: No acute cardiopulmonary disease. Electronically Signed   By: Karen Kays M.D.   On: 01/12/2023 11:17     Echo normal left ventricular function EF around 50 to 55% inferior hypokinesis  TELEMETRY: Atrial fibrillation RVR rate of 115 nonspecific ST-T wave changes:  ASSESSMENT AND PLAN:  Principal Problem:   STEMI involving right coronary artery (HCC) Active Problems:   Anxiety   Breathlessness on exertion   Adiposity   Paroxysmal atrial fibrillation (HCC)   ST elevation myocardial infarction involving right coronary artery (HCC)   AKI (acute kidney injury) (HCC)   CAD S/P percutaneous coronary angioplasty    Plan Inferior STEMI status post PCI and stent to mid RCA still with evidence of thrombosis requiring Aggrastat prolonged infusion and heparin.  Will consider discontinuing heparin within 24 to 48 hours. Continue aspirin Plavix beta-blocker add ACE inhibitor Atrial fibrillation will start amiodarone loading drip intravenously continue metoprolol and advanced dose for rate control we will switch from heparin to probably Eliquis for anticoagulation Episode of shortness of breath dyspnea recommend diuretics as necessary inhalers as necessary Hyperlipidemia continue Lipitor therapy for lipid management Would add low-dose Lasix daily to help with shortness of breath episodes Increase activity ambulate with assistance Relative hypotension expect over time to have it improve Recommend weight loss exercise portion control Consider sleep study for evaluation of possible obstructive sleep apnea Hopefully will consider discharge within the next 48 to 72 hours   Alwyn Pea, MD 01/13/2023 11:52 AM

## 2023-01-13 NOTE — Progress Notes (Addendum)
Patient transferred to 47 with NT [place] and this Charity fundraiser. Vitals WNL. All personal belongings sent with patient and sister. Wife at bedside upon arrival to 58. Report given to [facility] and documented in flowsheet. RN Praweena at bedside to accept patient when we arrived.

## 2023-01-13 NOTE — Consult Note (Signed)
ANTICOAGULATION CONSULT NOTE  Pharmacy Consult for IV Heparin Indication: atrial fibrillation  Patient Measurements: Height: 6\' 2"  (188 cm) Weight: 112.3 kg (247 lb 9.2 oz) IBW/kg (Calculated) : 82.2 Heparin Dosing Weight: 105.5 kg  Labs: Recent Labs    01/12/23 1008 01/12/23 1251 01/13/23 0421 01/13/23 1234 01/13/23 2005  HGB 15.4  --  14.2  --   --   HCT 42.8  --  39.8  --   --   PLT 195  --  172  --   --   HEPARINUNFRC  --   --   --  <0.10* <0.10*  CREATININE 1.29*  --  1.12  --   --   TROPONINIHS >24,000* >24,000*  --   --   --    Estimated Creatinine Clearance: 95.8 mL/min (by C-G formula based on SCr of 1.12 mg/dL).  Medical History: Past Medical History:  Diagnosis Date   Anxiety    Atrial fibrillation (HCC)    Dental crowns present    FRONT TWO TEETH   Deviated septum    Dysrhythmia    A-FIB/ DR Gwen Pounds   Hyperlipidemia    Hypertrophy of nasal turbinates    Meningitis    Obesity    Rhinitis, allergic    Seasonal allergies    Sleep apnea    C-PAP    Medications:  No anticoagulation prior to admission per my chart review  Assessment: 58 y/o M with medical history as above admitted with STEMI. Patient taken emergently for cardiac catheterization. Patient placed on Aggrastat gtt in cath lab to run x 18h in addition to DAPT. Patient with a history of atrial fibrillation not on anticoagulation. Pharmacy consulted to initiate and manage heparin infusion for Afib (after completion of Aggrastat gtt).  Baseline H&H, platelets within normal limits. Will put in to check aPTT and PT-INR with AM labs tomorrow.  Goal of Therapy:  Heparin level 0.3-0.7 units/ml Monitor platelets by anticoagulation protocol: Yes   Plan:  8/10@1234 : HL<0.10, subtherapeutic 8/10 2005 HL <0.10, subtherapeutic  --3000 unit IV bolus x 1 --Increasing infusion rate to 2100 units/hr --Recheck HL w/ AM labs after rate change --Daily CBC per protocol while on IV heparin  Otelia Sergeant, PharmD, [facility] 01/13/2023 10:09 PM

## 2023-01-13 NOTE — Progress Notes (Addendum)
Patient complains of shortness of breath. MD notified via secure chat by this RN. New orders placed by MD for chest xray, lasix and duoneb. Focused assessment completed and documented by RN for lung sounds; diminished slight fine crackles. Lasix and duoneb administered by RN. Patient also placed on 2L [place]. Saturation 96% with increased WOB.PRN morphine provided to patient s/t air hunger. Family updated at bedside of new orders placed by MD and medication education provided within RN scope. Radiology at bedside to complete chest xray.

## 2023-01-13 NOTE — Plan of Care (Signed)
  Problem: Education: Goal: Knowledge of General Education information will improve Description: Including pain rating scale, medication(s)/side effects and non-pharmacologic comfort measures Outcome: Progressing   Problem: Health Behavior/Discharge Planning: Goal: Ability to manage health-related needs will improve Outcome: Progressing   Problem: Clinical Measurements: Goal: Ability to maintain clinical measurements within normal limits will improve Outcome: Progressing Goal: Will remain free from infection Outcome: Progressing Goal: Diagnostic test results will improve Outcome: Progressing Goal: Respiratory complications will improve Outcome: Progressing Goal: Cardiovascular complication will be avoided Outcome: Progressing   Problem: Activity: Goal: Risk for activity intolerance will decrease Outcome: Progressing   Problem: Nutrition: Goal: Adequate nutrition will be maintained Outcome: Progressing   Problem: Coping: Goal: Level of anxiety will decrease Outcome: Progressing   Problem: Elimination: Goal: Will not experience complications related to bowel motility Outcome: Progressing Goal: Will not experience complications related to urinary retention Outcome: Progressing   Problem: Pain Managment: Goal: General experience of comfort will improve Outcome: Progressing   Problem: Safety: Goal: Ability to remain free from injury will improve Outcome: Progressing   Problem: Skin Integrity: Goal: Risk for impaired skin integrity will decrease Outcome: Progressing   Problem: Education: Goal: Understanding of CV disease, CV risk reduction, and recovery process will improve Outcome: Progressing Goal: Individualized Educational Video(s) Outcome: Progressing   Problem: Activity: Goal: Ability to return to baseline activity level will improve Outcome: Progressing   Problem: Cardiovascular: Goal: Ability to achieve and maintain adequate cardiovascular perfusion  will improve Outcome: Progressing Goal: Vascular access site(s) Level 0-1 will be maintained Outcome: Progressing   Problem: Health Behavior/Discharge Planning: Goal: Ability to safely manage health-related needs after discharge will improve Outcome: Progressing   Problem: Education: Goal: Ability to demonstrate management of disease process will improve Outcome: Progressing Goal: Ability to verbalize understanding of medication therapies will improve Outcome: Progressing Goal: Individualized Educational Video(s) Outcome: Progressing   Problem: Activity: Goal: Capacity to carry out activities will improve Outcome: Progressing   Problem: Cardiac: Goal: Ability to achieve and maintain adequate cardiopulmonary perfusion will improve Outcome: Progressing   

## 2023-01-13 NOTE — Plan of Care (Signed)
  Problem: Health Behavior/Discharge Planning: Goal: Ability to manage health-related needs will improve Outcome: Progressing   Problem: Clinical Measurements: Goal: Ability to maintain clinical measurements within normal limits will improve Outcome: Progressing Goal: Will remain free from infection Outcome: Progressing Goal: Respiratory complications will improve Outcome: Progressing Goal: Cardiovascular complication will be avoided Outcome: Progressing   Problem: Activity: Goal: Risk for activity intolerance will decrease Outcome: Progressing   Problem: Nutrition: Goal: Adequate nutrition will be maintained Outcome: Progressing   Problem: Coping: Goal: Level of anxiety will decrease Outcome: Progressing   Problem: Elimination: Goal: Will not experience complications related to bowel motility Outcome: Progressing Goal: Will not experience complications related to urinary retention Outcome: Progressing   Problem: Pain Managment: Goal: General experience of comfort will improve Outcome: Progressing

## 2023-01-13 NOTE — Progress Notes (Signed)
Receiving RN on 2A is admitting a new patient when RN called to give report. Time provided to admit and settle new patient and this RN will call back to give report. CN Katie aware. Room still being cleaned at this time.

## 2023-01-13 NOTE — Consult Note (Signed)
ANTICOAGULATION CONSULT NOTE  Pharmacy Consult for IV Heparin Indication: atrial fibrillation  Patient Measurements: Height: 6\' 2"  (188 cm) Weight: 112.3 kg (247 lb 9.2 oz) IBW/kg (Calculated) : 82.2 Heparin Dosing Weight: 105.5 kg  Labs: Recent Labs    01/12/23 1008 01/12/23 1251 01/13/23 0421 01/13/23 1234  HGB 15.4  --  14.2  --   HCT 42.8  --  39.8  --   PLT 195  --  172  --   HEPARINUNFRC  --   --   --  <0.10*  CREATININE 1.29*  --  1.12  --   TROPONINIHS >24,000* >24,000*  --   --    Estimated Creatinine Clearance: 95.8 mL/min (by C-G formula based on SCr of 1.12 mg/dL).  Medical History: Past Medical History:  Diagnosis Date   Anxiety    Atrial fibrillation (HCC)    Dental crowns present    FRONT TWO TEETH   Deviated septum    Dysrhythmia    A-FIB/ DR Gwen Pounds   Hyperlipidemia    Hypertrophy of nasal turbinates    Meningitis    Obesity    Rhinitis, allergic    Seasonal allergies    Sleep apnea    C-PAP    Medications:  No anticoagulation prior to admission per my chart review  Assessment: 57 y/o M with medical history as above admitted with STEMI. Patient taken emergently for cardiac catheterization. Patient placed on Aggrastat gtt in cath lab to run x 18h in addition to DAPT. Patient with a history of atrial fibrillation not on anticoagulation. Pharmacy consulted to initiate and manage heparin infusion for Afib (after completion of Aggrastat gtt).  Baseline H&H, platelets within normal limits. Will put in to check aPTT and PT-INR with AM labs tomorrow.  Goal of Therapy:  Heparin level 0.3-0.7 units/ml Monitor platelets by anticoagulation protocol: Yes   Plan:  --8/10@1234 : HL<0.10, therapeutic --Confirmed with nurse that heparin is still infusing and no interruptions noted --3000 unit IV bolus x 1 --Increasing infusion rate to 1800 units/hr --Heparin level 6 hours after rate change --Daily CBC per protocol while on IV heparin  Nakyiah Kuck A  Giavonni Fonder 01/13/2023,1:55 PM

## 2023-01-14 DIAGNOSIS — I2111 ST elevation (STEMI) myocardial infarction involving right coronary artery: Secondary | ICD-10-CM | POA: Diagnosis not present

## 2023-01-14 LAB — BASIC METABOLIC PANEL WITH GFR
Anion gap: 10 (ref 5–15)
BUN: 31 mg/dL — ABNORMAL HIGH (ref 6–20)
CO2: 24 mmol/L (ref 22–32)
Calcium: 7.9 mg/dL — ABNORMAL LOW (ref 8.9–10.3)
Chloride: 96 mmol/L — ABNORMAL LOW (ref 98–111)
Creatinine, Ser: 1.29 mg/dL — ABNORMAL HIGH (ref 0.61–1.24)
GFR, Estimated: >60 mL/min (ref >60)
Glucose, Bld: 142 mg/dL — ABNORMAL HIGH (ref 70–99)
Potassium: 3.5 mmol/L (ref 3.5–5.1)
Sodium: 130 mmol/L — ABNORMAL LOW (ref 135–145)

## 2023-01-14 LAB — CBC
HCT: 34.6 % — ABNORMAL LOW (ref 39.0–52.0)
Hemoglobin: 12.4 g/dL — ABNORMAL LOW (ref 13.0–17.0)
MCH: 31.8 pg (ref 26.0–34.0)
MCHC: 35.8 g/dL (ref 30.0–36.0)
MCV: 88.7 fL (ref 80.0–100.0)
Platelets: 154 10*3/uL (ref 150–400)
RBC: 3.9 MIL/uL — ABNORMAL LOW (ref 4.22–5.81)
RDW: 12.3 % (ref 11.5–15.5)
WBC: 10.3 10*3/uL (ref 4.0–10.5)
nRBC: 0 % (ref 0.0–0.2)

## 2023-01-14 LAB — HEPARIN LEVEL (UNFRACTIONATED)
Heparin Unfractionated: 0.13 [IU]/mL — ABNORMAL LOW (ref 0.30–0.70)
Heparin Unfractionated: 0.23 IU/mL — ABNORMAL LOW (ref 0.30–0.70)
Heparin Unfractionated: >1.1 IU/mL — ABNORMAL HIGH (ref 0.30–0.70)

## 2023-01-14 MED ORDER — HYDROCOD POLI-CHLORPHE POLI ER 10-8 MG/5ML PO SUER
5.0000 mL | Freq: Two times a day (BID) | ORAL | Status: DC
  Administered 2023-01-14 – 2023-01-16 (×5): 5 mL via ORAL
  Filled 2023-01-14 (×5): qty 5

## 2023-01-14 MED ORDER — LISINOPRIL 5 MG PO TABS
2.5000 mg | ORAL_TABLET | Freq: Two times a day (BID) | ORAL | Status: DC
  Administered 2023-01-14 – 2023-01-16 (×4): 2.5 mg via ORAL
  Filled 2023-01-14 (×4): qty 1

## 2023-01-14 MED ORDER — METOPROLOL TARTRATE 50 MG PO TABS
50.0000 mg | ORAL_TABLET | Freq: Three times a day (TID) | ORAL | Status: DC
  Administered 2023-01-14 – 2023-01-15 (×3): 50 mg via ORAL
  Filled 2023-01-14 (×4): qty 1

## 2023-01-14 MED ORDER — APIXABAN 5 MG PO TABS
5.0000 mg | ORAL_TABLET | Freq: Two times a day (BID) | ORAL | Status: DC
  Administered 2023-01-14 – 2023-01-16 (×5): 5 mg via ORAL
  Filled 2023-01-14 (×5): qty 1

## 2023-01-14 MED ORDER — HEPARIN BOLUS VIA INFUSION
3000.0000 [IU] | Freq: Once | INTRAVENOUS | Status: AC
  Administered 2023-01-14: 3000 [IU] via INTRAVENOUS
  Filled 2023-01-14: qty 3000

## 2023-01-14 MED ORDER — SODIUM CHLORIDE 0.9 % IV BOLUS
250.0000 mL | Freq: Once | INTRAVENOUS | Status: AC
  Administered 2023-01-14: 250 mL via INTRAVENOUS

## 2023-01-14 MED ORDER — HEPARIN BOLUS VIA INFUSION
1600.0000 [IU] | Freq: Once | INTRAVENOUS | Status: AC
  Administered 2023-01-14: 1600 [IU] via INTRAVENOUS
  Filled 2023-01-14: qty 1600

## 2023-01-14 NOTE — Consult Note (Signed)
ANTICOAGULATION CONSULT NOTE  Pharmacy Consult for Apixaban Indication: atrial fibrillation  Patient Measurements: Height: 6\' 2"  (188 cm) Weight: 111.6 kg (246 lb) IBW/kg (Calculated) : 82.2 Heparin Dosing Weight: 105.5 kg  Labs: Recent Labs    01/12/23 1008 01/12/23 1251 01/13/23 0421 01/13/23 1234 01/13/23 2005 01/14/23 0529 01/14/23 1230  HGB 15.4  --  14.2  --   --  12.4*  --   HCT 42.8  --  39.8  --   --  34.6*  --   PLT 195  --  172  --   --  154  --   HEPARINUNFRC  --   --   --    < > <0.10* 0.13* 0.23*  CREATININE 1.29*  --  1.12  --   --  1.29*  --   TROPONINIHS >24,000* >24,000*  --   --   --   --   --    < > = values in this interval not displayed.   Estimated Creatinine Clearance: 83 mL/min (A) (by C-G formula based on SCr of 1.29 mg/dL (H)).  Medical History: Past Medical History:  Diagnosis Date   Anxiety    Atrial fibrillation (HCC)    Dental crowns present    FRONT TWO TEETH   Deviated septum    Dysrhythmia    A-FIB/ DR Gwen Pounds   Hyperlipidemia    Hypertrophy of nasal turbinates    Meningitis    Obesity    Rhinitis, allergic    Seasonal allergies    Sleep apnea    C-PAP   Assessment: Keith Torres. is a 57 y.o. male with medical history as above admitted with STEMI. Patient taken emergently for cardiac catheterization. Patient placed on Aggrastat gtt in cath lab to run x 18h in addition to DAPT. Patient with a history of atrial fibrillation not on anticoagulation. CHADS-VASc at least 1. Pharmacy consulted to initiate and manage heparin infusion for Afib (after completion of Aggrastat gtt).   Plan:  Discontinue heparin infusion Start apixaban 5 mg BID  Pharmacy will sign off and continue to monitor peripherally     Celene Squibb, PharmD Clinical Pharmacist 01/14/2023 2:57 PM

## 2023-01-14 NOTE — Progress Notes (Signed)
Patient ID: Keith Locke., male   DOB: Jan 26, 1966, 57 y.o.   MRN: [id number] [facility]    SUBJECTIVE: Patient states to be doing reasonably well no chest pain resting comfortably improved appetite sitting up eating breakfast no chest pain denies tachycardia or palpitations blood pressure much improved   Vitals:   01/13/23 2313 01/14/23 0716 01/14/23 0839 01/14/23 1117  BP: 109/71  96/76 94/76  Pulse: (!) 114  100 92  Resp:   18 18  Temp: 98.2 F (36.8 C)  99.9 F (37.7 C) 98.4 F (36.9 C)  TempSrc:   Oral Oral  SpO2: 99%  96% 99%  Weight:  111.6 kg    Height:         Intake/Output Summary (Last 24 hours) at 01/14/2023 1140 Last data filed at 01/13/2023 2219 Gross per 24 hour  Intake 190.4 ml  Output 750 ml  Net -559.6 ml      PHYSICAL EXAM  General: Well developed, well nourished, in no acute distress HEENT:  Normocephalic and atramatic Neck:  No JVD.  Lungs: Clear bilaterally to auscultation and percussion. Heart: Tachycardia irregularly irregular. Normal S1 and S2 without gallops or murmurs.  Abdomen: Bowel sounds are positive, abdomen soft and non-tender  Msk:  Back normal, normal gait. Normal strength and tone for age. Extremities: No clubbing, cyanosis or edema.   Neuro: Alert and oriented X 3. Psych:  Good affect, responds appropriately   LABS: Basic Metabolic Panel: Recent Labs    01/13/23 0421 01/14/23 0529  NA 132* 130*  K 4.0 3.5  CL 97* 96*  CO2 26 24  GLUCOSE 173* 142*  BUN 19 31*  CREATININE 1.12 1.29*  CALCIUM 8.5* 7.9*   Liver Function Tests: No results for input(s): "AST", "ALT", "ALKPHOS", "BILITOT", "PROT", "ALBUMIN" in the last 72 hours. Recent Labs    01/12/23 1008  LIPASE 27   CBC: Recent Labs    01/13/23 0421 01/14/23 0529  WBC 11.3* 10.3  HGB 14.2 12.4*  HCT 39.8 34.6*  MCV 89.6 88.7  PLT 172 154   Cardiac Enzymes: No results for input(s): "CKTOTAL", "CKMB", "CKMBINDEX", "TROPONINI" in the last 72  hours. BNP: Invalid input(s): "POCBNP" D-Dimer: No results for input(s): "DDIMER" in the last 72 hours. Hemoglobin A1C: Recent Labs    01/13/23 0421  HGBA1C 5.4   Fasting Lipid Panel: Recent Labs    01/13/23 0421  CHOL 149  HDL 54  LDLCALC 77  TRIG 90  CHOLHDL 2.8   Thyroid Function Tests: No results for input(s): "TSH", "T4TOTAL", "T3FREE", "THYROIDAB" in the last 72 hours.  Invalid input(s): "FREET3" Anemia Panel: No results for input(s): "VITAMINB12", "FOLATE", "FERRITIN", "TIBC", "IRON", "RETICCTPCT" in the last 72 hours.  DG Chest Port 1 View  Result Date: 01/13/2023 CLINICAL DATA:  57 year old male with shortness of breath. EXAM: PORTABLE CHEST 1 VIEW COMPARISON:  Portable chest 01/12/2023 and earlier. FINDINGS: Portable AP upright view at 1122 hours. Chronically low lung volumes. Mediastinal contours remain normal. Visualized tracheal air column is within normal limits. Allowing for portable technique the lungs are clear. No pneumothorax or pleural effusion. No acute osseous abnormality identified. Negative visible bowel gas. IMPRESSION: No acute cardiopulmonary abnormality. Electronically Signed   By: Odessa Fleming M.D.   On: 01/13/2023 11:28   ECHOCARDIOGRAM COMPLETE  Result Date: 01/12/2023    ECHOCARDIOGRAM REPORT   Patient Name:   Keith Fadler. Date of Exam: 01/12/2023 Medical Rec #:  [id number]  Height:       74.0 in Accession #:    [id number]           Weight:       252.2 lb Date of Birth:  07-30-1965            BSA:          2.399 m Patient Age:    57 years             BP:           111/54 mmHg Patient Gender: M                    HR:           95 bpm. Exam Location:  [facility] Procedure: 2D Echo, Cardiac Doppler and Color Doppler Indications:     Acute myocardial Infarction --unspecified I21.9  History:         Patient has no prior history of Echocardiogram examinations.                  Arrythmias:Atrial Fibrillation; Risk Factors:Dyslipidemia and                   Sleep Apnea.  Sonographer:     Cristela Blue Referring Phys:  Debbe Odea MD Diagnosing Phys: Debbe Odea MD  Sonographer Comments: Technically challenging study due to limited acoustic windows and no parasternal window. IMPRESSIONS  1. Left ventricular ejection fraction, by estimation, is 50 to 55%. Left ventricular ejection fraction by PLAX is 51 %. The left ventricle has low normal function. The left ventricle has no regional wall motion abnormalities. There is mild left ventricular hypertrophy. Left ventricular diastolic parameters are indeterminate.  2. Right ventricular systolic function is low normal. The right ventricular size is mildly enlarged. There is normal pulmonary artery systolic pressure.  3. The mitral valve is normal in structure. Mild mitral valve regurgitation.  4. The aortic valve was not well visualized. Aortic valve regurgitation is trivial.  5. The inferior vena cava is dilated in size with >50% respiratory variability, suggesting right atrial pressure of 8 mmHg. FINDINGS  Left Ventricle: Left ventricular ejection fraction, by estimation, is 50 to 55%. Left ventricular ejection fraction by PLAX is 51 %. The left ventricle has low normal function. The left ventricle has no regional wall motion abnormalities. The left ventricular internal cavity size was normal in size. There is mild left ventricular hypertrophy. Left ventricular diastolic parameters are indeterminate. Right Ventricle: The right ventricular size is mildly enlarged. No increase in right ventricular wall thickness. Right ventricular systolic function is low normal. There is normal pulmonary artery systolic pressure. The tricuspid regurgitant velocity is 2.12 m/s, and with an assumed right atrial pressure of 8 mmHg, the estimated right ventricular systolic pressure is 26.0 mmHg. Left Atrium: Left atrial size was normal in size. Right Atrium: Right atrial size was normal in size. Pericardium: There is no evidence of  pericardial effusion. Mitral Valve: The mitral valve is normal in structure. Mild mitral valve regurgitation. Tricuspid Valve: The tricuspid valve is normal in structure. Tricuspid valve regurgitation is not demonstrated. Aortic Valve: The aortic valve was not well visualized. Aortic valve regurgitation is trivial. Aortic valve mean gradient measures 4.0 mmHg. Aortic valve peak gradient measures 7.5 mmHg. Aortic valve area, by VTI measures 2.58 cm. Pulmonic Valve: The pulmonic valve was not well visualized. Pulmonic valve regurgitation is not visualized. Aorta: The aortic root is normal in size and structure. Venous:  The inferior vena cava is dilated in size with greater than 50% respiratory variability, suggesting right atrial pressure of 8 mmHg. IAS/Shunts: No atrial level shunt detected by color flow Doppler.  LEFT VENTRICLE PLAX 2D LV EF:         Left ventricular ejection fraction by PLAX is 51 %. LVIDd:         3.90 cm LVIDs:         2.90 cm LV PW:         0.90 cm LV IVS:        1.40 cm LVOT diam:     2.20 cm LV SV:         46 LV SV Index:   19 LVOT Area:     3.80 cm  RIGHT VENTRICLE RV Basal diam:  4.40 cm RV Mid diam:    2.70 cm RV S prime:     11.50 cm/s TAPSE (M-mode): 1.9 cm LEFT ATRIUM             Index        RIGHT ATRIUM           Index LA diam:        3.00 cm 1.25 cm/m   RA Area:     18.70 cm LA Vol (A2C):   44.9 ml 18.72 ml/m  RA Volume:   52.60 ml  21.93 ml/m LA Vol (A4C):   60.8 ml 25.35 ml/m LA Biplane Vol: 57.3 ml 23.89 ml/m  AORTIC VALVE AV Area (Vmax):    2.46 cm AV Area (Vmean):   2.47 cm AV Area (VTI):     2.58 cm AV Vmax:           137.00 cm/s AV Vmean:          91.200 cm/s AV VTI:            0.178 m AV Peak Grad:      7.5 mmHg AV Mean Grad:      4.0 mmHg LVOT Vmax:         88.60 cm/s LVOT Vmean:        59.200 cm/s LVOT VTI:          0.121 m LVOT/AV VTI ratio: 0.68  AORTA Ao Root diam: 2.80 cm MITRAL VALVE               TRICUSPID VALVE MV Area (PHT): 3.30 cm    TR Peak grad:    18.0 mmHg MV Decel Time: 230 msec    TR Vmax:        212.00 cm/s MV E velocity: 78.60 cm/s                            SHUNTS                            Systemic VTI:  0.12 m                            Systemic Diam: 2.20 cm Debbe Odea MD Electronically signed by Debbe Odea MD Signature Date/Time: 01/12/2023/3:59:22 PM    Final      Function EF 50 to 55% echo borderline low left ventricular inferior borderline hypoxic  TELEMETRY: Atrial fibrillation rate of 115 low voltage nonspecific ST-T wave changes:  ASSESSMENT AND PLAN:  Principal Problem:   STEMI involving  right coronary artery [facility]) Active Problems:   Anxiety   Breathlessness on exertion   Adiposity   Paroxysmal atrial fibrillation (HCC)   ST elevation myocardial infarction involving right coronary artery (HCC)   AKI (acute kidney injury) (HCC)   CAD S/P percutaneous coronary angioplasty    Plan Status post STEMI PCI and stent continue aspirin Plavix statin blood pressure control increase activity Paroxysmal atrial fibrillation RVR switched to amiodarone IV load and drip as well as increase metoprolol dose transition from heparin to Eliquis for anticoagulation Coronary artery disease status post PCI and stent RCA with subsequent distal vessel thrombus with occlusion treated with Aggrastat for 18 hours as well as heparin for an additional 24-48. Obesity mild recommend modest weight loss exercise portion control Shortness of breath dyspnea possibly due to congestion cannot rule out COPD consider inhalers low-dose diuretic Increase activity will consider hopefully discharge within the next 24 to 48 hours   Alwyn Pea, MD 01/14/2023 11:40 AM

## 2023-01-14 NOTE — Plan of Care (Signed)
  Problem: Pain Managment: Goal: General experience of comfort will improve Outcome: Progressing   Problem: Activity: Goal: Ability to return to baseline activity level will improve Outcome: Progressing   Problem: Cardiovascular: Goal: Ability to achieve and maintain adequate cardiovascular perfusion will improve Outcome: Progressing   Problem: Activity: Goal: Capacity to carry out activities will improve Outcome: Progressing   Problem: Cardiac: Goal: Ability to achieve and maintain adequate cardiopulmonary perfusion will improve Outcome: Progressing

## 2023-01-14 NOTE — Plan of Care (Signed)
  Problem: Education: Goal: Knowledge of General Education information will improve Description: Including pain rating scale, medication(s)/side effects and non-pharmacologic comfort measures Outcome: Progressing   Problem: Health Behavior/Discharge Planning: Goal: Ability to manage health-related needs will improve Outcome: Progressing   Problem: Coping: Goal: Level of anxiety will decrease Outcome: Progressing   Problem: Elimination: Goal: Will not experience complications related to bowel motility Outcome: Progressing Goal: Will not experience complications related to urinary retention Outcome: Progressing   Problem: Skin Integrity: Goal: Risk for impaired skin integrity will decrease Outcome: Progressing   Problem: Health Behavior/Discharge Planning: Goal: Ability to safely manage health-related needs after discharge will improve Outcome: Progressing   Problem: Education: Goal: Ability to demonstrate management of disease process will improve Outcome: Progressing   Problem: Clinical Measurements: Goal: Respiratory complications will improve Outcome: Not Progressing

## 2023-01-14 NOTE — Consult Note (Addendum)
ANTICOAGULATION CONSULT NOTE  Pharmacy Consult for IV Heparin Indication: atrial fibrillation  Patient Measurements: Height: 6\' 2"  (188 cm) Weight: 111.6 kg (246 lb) IBW/kg (Calculated) : 82.2 Heparin Dosing Weight: 105.5 kg  Labs: Recent Labs    01/12/23 1008 01/12/23 1251 01/13/23 0421 01/13/23 1234 01/13/23 2005 01/14/23 0529 01/14/23 1230  HGB 15.4  --  14.2  --   --  12.4*  --   HCT 42.8  --  39.8  --   --  34.6*  --   PLT 195  --  172  --   --  154  --   HEPARINUNFRC  --   --   --    < > <0.10* 0.13* 0.23*  CREATININE 1.29*  --  1.12  --   --  1.29*  --   TROPONINIHS >24,000* >24,000*  --   --   --   --   --    < > = values in this interval not displayed.   Estimated Creatinine Clearance: 83 mL/min (A) (by C-G formula based on SCr of 1.29 mg/dL (H)).  Medical History: Past Medical History:  Diagnosis Date   Anxiety    Atrial fibrillation (HCC)    Dental crowns present    FRONT TWO TEETH   Deviated septum    Dysrhythmia    A-FIB/ DR Gwen Pounds   Hyperlipidemia    Hypertrophy of nasal turbinates    Meningitis    Obesity    Rhinitis, allergic    Seasonal allergies    Sleep apnea    C-PAP    Medications:  No anticoagulation prior to admission per my chart review  Assessment: 57 y/o M with medical history as above admitted with STEMI. Patient taken emergently for cardiac catheterization. Patient placed on Aggrastat gtt in cath lab to run x 18h in addition to DAPT. Patient with a history of atrial fibrillation not on anticoagulation. Pharmacy consulted to initiate and manage heparin infusion for Afib (after completion of Aggrastat gtt).  Baseline H&H, platelets within normal limits. Will put in to check aPTT and PT-INR with AM labs tomorrow.  Goal of Therapy:  Heparin level 0.3-0.7 units/ml Monitor platelets by anticoagulation protocol: Yes   Plan:  8/10@1234 : HL<0.10, subtherapeutic 8/10 2005 HL <0.10, subtherapeutic 8/11 0529 HL 0.13,  subtherapeutic 8/11 1230 HL 0.23, subtherapeutic  --1600 unit IV bolus x 1 --Increasing infusion rate to 2650 units/hr --Recheck HL in 6 hrs after rate change --Daily CBC per protocol while on IV heparin  Sharen Hones, PharmD, BCPS Clinical Pharmacist   01/14/2023 1:15 PM

## 2023-01-14 NOTE — Consult Note (Signed)
ANTICOAGULATION CONSULT NOTE  Pharmacy Consult for IV Heparin Indication: atrial fibrillation  Patient Measurements: Height: 6\' 2"  (188 cm) Weight: 112.3 kg (247 lb 9.2 oz) IBW/kg (Calculated) : 82.2 Heparin Dosing Weight: 105.5 kg  Labs: Recent Labs    01/12/23 1008 01/12/23 1251 01/13/23 0421 01/13/23 1234 01/13/23 2005 01/14/23 0529  HGB 15.4  --  14.2  --   --  12.4*  HCT 42.8  --  39.8  --   --  34.6*  PLT 195  --  172  --   --  154  HEPARINUNFRC  --   --   --  <0.10* <0.10* 0.13*  CREATININE 1.29*  --  1.12  --   --  1.29*  TROPONINIHS >24,000* >24,000*  --   --   --   --    Estimated Creatinine Clearance: 83.2 mL/min (A) (by C-G formula based on SCr of 1.29 mg/dL (H)).  Medical History: Past Medical History:  Diagnosis Date   Anxiety    Atrial fibrillation (HCC)    Dental crowns present    FRONT TWO TEETH   Deviated septum    Dysrhythmia    A-FIB/ DR Gwen Pounds   Hyperlipidemia    Hypertrophy of nasal turbinates    Meningitis    Obesity    Rhinitis, allergic    Seasonal allergies    Sleep apnea    C-PAP    Medications:  No anticoagulation prior to admission per my chart review  Assessment: 57 y/o M with medical history as above admitted with STEMI. Patient taken emergently for cardiac catheterization. Patient placed on Aggrastat gtt in cath lab to run x 18h in addition to DAPT. Patient with a history of atrial fibrillation not on anticoagulation. Pharmacy consulted to initiate and manage heparin infusion for Afib (after completion of Aggrastat gtt).  Baseline H&H, platelets within normal limits. Will put in to check aPTT and PT-INR with AM labs tomorrow.  Goal of Therapy:  Heparin level 0.3-0.7 units/ml Monitor platelets by anticoagulation protocol: Yes   Plan:  8/10@1234 : HL<0.10, subtherapeutic 8/10 2005 HL <0.10, subtherapeutic 8/11 0529 HL 0.13, subtherapeutic  --3000 unit IV bolus x 1 --Increasing infusion rate to 2400 units/hr --Recheck  HL in 6 hrs after rate change --Daily CBC per protocol while on IV heparin  Otelia Sergeant, PharmD, [facility] 01/14/2023 5:56 AM

## 2023-01-14 NOTE — Progress Notes (Signed)
PROGRESS NOTE    Keith Torres.  ONG:[id number] DOB: 07-15-1965 DOA: 01/12/2023 PCP: Dione Housekeeper, MD    Brief Narrative:  57 year old male with history of hypertension, atrial fibrillation was on flecainide, who presents to the emergency department for chief concerns of chest pressure, vomiting, abdominal distention.  High sensitive troponin is greater than 24,000 EKG with inferolateral STEMI.  STEMI cardiologist called to bedside.  Patient taken emergently to Cath Lab for PCI.  Status post PCI with DES to mid RCA.  Admitted to ICU on tirofiban infusion post procedurally.  Required vasopressor support for short period of time.  8/10: Doing well overall.  In rapid atrial fibrillation.  Reports of intermittent chest tightness.  Vital signs stable.  Wife at bedside.  8/11: Remains in rapid atrial fibrillation.  Complains of cough  Assessment & Plan:   Principal Problem:   STEMI involving right coronary artery (HCC) Active Problems:   Anxiety   Breathlessness on exertion   Adiposity   Paroxysmal atrial fibrillation (HCC)   ST elevation myocardial infarction involving right coronary artery (HCC)   AKI (acute kidney injury) (HCC)   CAD S/P percutaneous coronary angioplasty   STEMI involving right coronary artery (HCC) Status post left heart cath with PCI to RCA Per cardiology recommendation, continue to tirofiban infusion for 18 hours followed by heparin infusion Complete echo ordered Plan: Continue heparin GTT Aggressive goal-directed medical therapy Per cardiology recommendations aspirin Plavix and apixaban x 1 week followed by apixaban and clopidogrel for 12 months Cardiology follow-up Telemetry monitoring EKG as needed chest pain   CAD S/P percutaneous coronary angioplasty Recommended apixaban and clopidogrel for 12 months post discharge Aggressive secondary coronary artery disease prevention   AKI (acute kidney injury) (HCC) Suspect secondary to  cardiorenal Kidney function returned to baseline   Paroxysmal atrial fibrillation (HCC) Patient currently on heparin gtt..  Will continue for today.  Will likely transition to p.o. Eliquis at time of discharge.  Patient previously maintained on flecainide.  No longer a possibility given ischemic heart disease.  Remains rapid atrial fibrillation Plan: Amiodarone gtt. per cardiology Metoprolol 25 mg every 8 hours Telemetry monitoring  Dyspnea on exertion Secondary to STEMI, treat per above   Anxiety As needed IV Ativan   DVT prophylaxis: Heparin GTT Code Status: Full Family Communication: Spouse at bedside 8/10, 8/11 Disposition Plan: Status is: Inpatient Remains inpatient appropriate because: STEMI.  Rapid atrial fibrillation.  Discharge once rate controlled   Level of care: Progressive  Consultants:  Cardiology  Procedures:  Left heart catheterization.  PCI with DES 8/9  Antimicrobials: None   Subjective: Seen and examined.  Endorses cough.  No pain complaints  Objective: Vitals:   01/13/23 2005 01/13/23 2313 01/14/23 0716 01/14/23 0839  BP: 101/74 109/71  96/76  Pulse: (!) 102 (!) 114  100  Resp:    18  Temp: 98.4 F (36.9 C) 98.2 F (36.8 C)  99.9 F (37.7 C)  TempSrc: Oral   Oral  SpO2: 96% 99%  96%  Weight:   111.6 kg   Height:        Intake/Output Summary (Last 24 hours) at 01/14/2023 1051 Last data filed at 01/13/2023 2219 Gross per 24 hour  Intake 190.4 ml  Output 750 ml  Net -559.6 ml   Filed Weights   01/12/23 1234 01/13/23 0453 01/14/23 0716  Weight: 114.4 kg 112.3 kg 111.6 kg    Examination:  General exam: NAD Respiratory system: Lungs clear.  Normal work of  breathing.  Room air Cardiovascular system: S1-S2, tachycardic, irregular rhythm, no murmurs, no pedal edema Gastrointestinal system: Soft, NT/ND, normal bowel sounds Central nervous system: Alert and oriented. No focal neurological deficits. Extremities: Symmetric 5 x 5  power. Skin: No rashes, lesions or ulcers Psychiatry: Judgement and insight appear normal. Mood & affect appropriate.     Data Reviewed: I have personally reviewed following labs and imaging studies  CBC: Recent Labs  Lab 01/12/23 1008 01/13/23 0421 01/14/23 0529  WBC 14.6* 11.3* 10.3  HGB 15.4 14.2 12.4*  HCT 42.8 39.8 34.6*  MCV 87.2 89.6 88.7  PLT 195 172 154   Basic Metabolic Panel: Recent Labs  Lab 01/12/23 1008 01/13/23 0421 01/14/23 0529  NA 135 132* 130*  K 4.0 4.0 3.5  CL 99 97* 96*  CO2 24 26 24   GLUCOSE 182* 173* 142*  BUN 18 19 31*  CREATININE 1.29* 1.12 1.29*  CALCIUM 8.9 8.5* 7.9*   GFR: Estimated Creatinine Clearance: 83 mL/min (A) (by C-G formula based on SCr of 1.29 mg/dL (H)). Liver Function Tests: No results for input(s): "AST", "ALT", "ALKPHOS", "BILITOT", "PROT", "ALBUMIN" in the last 168 hours. Recent Labs  Lab 01/12/23 1008  LIPASE 27   No results for input(s): "AMMONIA" in the last 168 hours. Coagulation Profile: No results for input(s): "INR", "PROTIME" in the last 168 hours. Cardiac Enzymes: No results for input(s): "CKTOTAL", "CKMB", "CKMBINDEX", "TROPONINI" in the last 168 hours. BNP (last 3 results) No results for input(s): "PROBNP" in the last 8760 hours. HbA1C: Recent Labs    01/13/23 0421  HGBA1C 5.4   CBG: Recent Labs  Lab 01/12/23 1236  GLUCAP 166*   Lipid Profile: Recent Labs    01/12/23 1008 01/13/23 0421  CHOL 174 149  HDL 51 54  LDLCALC 102* 77  TRIG 104 90  CHOLHDL 3.4 2.8   Thyroid Function Tests: No results for input(s): "TSH", "T4TOTAL", "FREET4", "T3FREE", "THYROIDAB" in the last 72 hours. Anemia Panel: No results for input(s): "VITAMINB12", "FOLATE", "FERRITIN", "TIBC", "IRON", "RETICCTPCT" in the last 72 hours. Sepsis Labs: No results for input(s): "PROCALCITON", "LATICACIDVEN" in the last 168 hours.  Recent Results (from the past 240 hour(s))  MRSA Next Gen by PCR, Nasal     Status: None    Collection Time: 01/12/23 12:37 PM   Specimen: Nasal Mucosa; Nasal Swab  Result Value Ref Range Status   MRSA by PCR Next Gen NOT DETECTED NOT DETECTED Final    Comment: (NOTE) The GeneXpert MRSA Assay (FDA approved for NASAL specimens only), is one component of a comprehensive MRSA colonization surveillance program. It is not intended to diagnose MRSA infection nor to guide or monitor treatment for MRSA infections. Test performance is not FDA approved in patients less than 29 years old. Performed at [facility], [address], [place], [place] [address]          Radiology Studies: [facility] Chest [place] 1 View  Result Date: 01/13/2023 CLINICAL DATA:  57 year old male with shortness of breath. EXAM: PORTABLE CHEST 1 VIEW COMPARISON:  Portable chest 01/12/2023 and earlier. FINDINGS: Portable AP upright view at 1122 hours. Chronically low lung volumes. Mediastinal contours remain normal. Visualized tracheal air column is within normal limits. Allowing for portable technique the lungs are clear. No pneumothorax or pleural effusion. No acute osseous abnormality identified. Negative visible bowel gas. IMPRESSION: No acute cardiopulmonary abnormality. Electronically Signed   By: Odessa Fleming M.D.   On: 01/13/2023 11:28   ECHOCARDIOGRAM COMPLETE  Result  Date: 01/12/2023    ECHOCARDIOGRAM REPORT   Patient Name:   Keith Torres. Date of Exam: 01/12/2023 Medical Rec #:  [id number]            Height:       74.0 in Accession #:    [id number]           Weight:       252.2 lb Date of Birth:  06/24/1965            BSA:          2.399 m Patient Age:    57 years             BP:           111/54 mmHg Patient Gender: M                    HR:           95 bpm. Exam Location:  [facility] Procedure: 2D Echo, Cardiac Doppler and Color Doppler Indications:     Acute myocardial Infarction --unspecified I21.9  History:         Patient has no prior history of Echocardiogram examinations.                  Arrythmias:Atrial  Fibrillation; Risk Factors:Dyslipidemia and                  Sleep Apnea.  Sonographer:     Cristela Blue Referring Phys:  Debbe Odea MD Diagnosing Phys: Debbe Odea MD  Sonographer Comments: Technically challenging study due to limited acoustic windows and no parasternal window. IMPRESSIONS  1. Left ventricular ejection fraction, by estimation, is 50 to 55%. Left ventricular ejection fraction by PLAX is 51 %. The left ventricle has low normal function. The left ventricle has no regional wall motion abnormalities. There is mild left ventricular hypertrophy. Left ventricular diastolic parameters are indeterminate.  2. Right ventricular systolic function is low normal. The right ventricular size is mildly enlarged. There is normal pulmonary artery systolic pressure.  3. The mitral valve is normal in structure. Mild mitral valve regurgitation.  4. The aortic valve was not well visualized. Aortic valve regurgitation is trivial.  5. The inferior vena cava is dilated in size with >50% respiratory variability, suggesting right atrial pressure of 8 mmHg. FINDINGS  Left Ventricle: Left ventricular ejection fraction, by estimation, is 50 to 55%. Left ventricular ejection fraction by PLAX is 51 %. The left ventricle has low normal function. The left ventricle has no regional wall motion abnormalities. The left ventricular internal cavity size was normal in size. There is mild left ventricular hypertrophy. Left ventricular diastolic parameters are indeterminate. Right Ventricle: The right ventricular size is mildly enlarged. No increase in right ventricular wall thickness. Right ventricular systolic function is low normal. There is normal pulmonary artery systolic pressure. The tricuspid regurgitant velocity is 2.12 m/s, and with an assumed right atrial pressure of 8 mmHg, the estimated right ventricular systolic pressure is 26.0 mmHg. Left Atrium: Left atrial size was normal in size. Right Atrium: Right atrial size  was normal in size. Pericardium: There is no evidence of pericardial effusion. Mitral Valve: The mitral valve is normal in structure. Mild mitral valve regurgitation. Tricuspid Valve: The tricuspid valve is normal in structure. Tricuspid valve regurgitation is not demonstrated. Aortic Valve: The aortic valve was not well visualized. Aortic valve regurgitation is trivial. Aortic valve mean gradient measures 4.0 mmHg. Aortic valve peak gradient  measures 7.5 mmHg. Aortic valve area, by VTI measures 2.58 cm. Pulmonic Valve: The pulmonic valve was not well visualized. Pulmonic valve regurgitation is not visualized. Aorta: The aortic root is normal in size and structure. Venous: The inferior vena cava is dilated in size with greater than 50% respiratory variability, suggesting right atrial pressure of 8 mmHg. IAS/Shunts: No atrial level shunt detected by color flow Doppler.  LEFT VENTRICLE PLAX 2D LV EF:         Left ventricular ejection fraction by PLAX is 51 %. LVIDd:         3.90 cm LVIDs:         2.90 cm LV PW:         0.90 cm LV IVS:        1.40 cm LVOT diam:     2.20 cm LV SV:         46 LV SV Index:   19 LVOT Area:     3.80 cm  RIGHT VENTRICLE RV Basal diam:  4.40 cm RV Mid diam:    2.70 cm RV S prime:     11.50 cm/s TAPSE (M-mode): 1.9 cm LEFT ATRIUM             Index        RIGHT ATRIUM           Index LA diam:        3.00 cm 1.25 cm/m   RA Area:     18.70 cm LA Vol (A2C):   44.9 ml 18.72 ml/m  RA Volume:   52.60 ml  21.93 ml/m LA Vol (A4C):   60.8 ml 25.35 ml/m LA Biplane Vol: 57.3 ml 23.89 ml/m  AORTIC VALVE AV Area (Vmax):    2.46 cm AV Area (Vmean):   2.47 cm AV Area (VTI):     2.58 cm AV Vmax:           137.00 cm/s AV Vmean:          91.200 cm/s AV VTI:            0.178 m AV Peak Grad:      7.5 mmHg AV Mean Grad:      4.0 mmHg LVOT Vmax:         88.60 cm/s LVOT Vmean:        59.200 cm/s LVOT VTI:          0.121 m LVOT/AV VTI ratio: 0.68  AORTA Ao Root diam: 2.80 cm MITRAL VALVE                TRICUSPID VALVE MV Area (PHT): 3.30 cm    TR Peak grad:   18.0 mmHg MV Decel Time: 230 msec    TR Vmax:        212.00 cm/s MV E velocity: 78.60 cm/s                            SHUNTS                            Systemic VTI:  0.12 m                            Systemic Diam: 2.20 cm Debbe Odea MD Electronically signed by Debbe Odea MD Signature Date/Time: 01/12/2023/3:59:22 PM    Final    CARDIAC CATHETERIZATION  Result Date: 01/12/2023 Conclusions: Severe single-vessel coronary artery disease with thrombotic occlusion of mid RCA involving acute marginal branch as well as embolized thrombus in the RPDA.  There is also moderate, nonobstructive mid LAD disease of up to 50%. Moderately reduced left ventricular systolic function (LVEF 35-45%) with inferior akinesis. Mildly elevated left ventricular filling pressure (LVEDP 20 mmHg). Challenging but successful PCI to occluded mid RCA using Onyx frontier 3.0 x 18 mm drug-eluting stent with 0% residual stenosis and TIMI-3 flow.  Embolized thrombus in the PDA improved with angioplasty, though the distal vessel remained occluded at the end of the case.  Difficulty crossing the mid RCA occlusion consistent with organizing thrombus in the setting of late presenting STEMI. Recommendations: Wean off norepinephrine as tolerated.  Suspect acute RV infarction from RCA/acute marginal disease may be contributing to hypotension. Continue tirofiban infusion for 18 hours.  Recommend starting heparin infusion tomorrow after discontinuation of tirofiban. Given atrial fibrillation and CHA2DS2-VASc score of at least 2 (coronary artery disease and cardiomyopathy), the patient would benefit from long-term anticoagulation.  Favor transitioning from ticagrelor to clopidogrel prior to discharge, treating with aspirin, clopidogrel, and apixaban for 1 week, and subsequently maintaining apixaban and clopidogrel for 12 months. Aggressive secondary prevention of coronary artery disease.  Follow-up echocardiogram. Yvonne Kendall, MD [facility]       Scheduled Meds:  aspirin  81 mg Oral Daily   atorvastatin  40 mg Oral Daily   benzonatate  200 mg Oral TID   Chlorhexidine Gluconate Cloth  6 each Topical Q0600   chlorpheniramine-HYDROcodone  5 mL Oral Q12H   clopidogrel  75 mg Oral Daily   lisinopril  2.5 mg Oral Daily   metoprolol tartrate  25 mg Oral Q8H   sodium chloride flush  3 mL Intravenous Q12H   Continuous Infusions:  sodium chloride     sodium chloride     amiodarone 30 mg/hr (01/13/23 2101)   heparin 2,400 Units/hr (01/14/23 3875)     LOS: 2 days     Tresa Moore, MD [facility]   If 7PM-7AM, please contact night-coverage  01/14/2023, 10:51 AM

## 2023-01-15 ENCOUNTER — Other Ambulatory Visit (HOSPITAL_COMMUNITY): Payer: Self-pay

## 2023-01-15 ENCOUNTER — Inpatient Hospital Stay: Payer: BC Managed Care – PPO

## 2023-01-15 ENCOUNTER — Encounter: Payer: Self-pay | Admitting: Internal Medicine

## 2023-01-15 DIAGNOSIS — I2111 ST elevation (STEMI) myocardial infarction involving right coronary artery: Secondary | ICD-10-CM | POA: Diagnosis not present

## 2023-01-15 DIAGNOSIS — I4891 Unspecified atrial fibrillation: Secondary | ICD-10-CM

## 2023-01-15 LAB — SARS CORONAVIRUS 2 BY RT PCR: SARS Coronavirus 2 by RT PCR: NEGATIVE

## 2023-01-15 LAB — URINALYSIS, COMPLETE (UACMP) WITH MICROSCOPIC
Bacteria, UA: NONE SEEN
Bilirubin Urine: NEGATIVE
Glucose, UA: NEGATIVE mg/dL
Hgb urine dipstick: NEGATIVE
Ketones, ur: NEGATIVE mg/dL
Leukocytes,Ua: NEGATIVE
Nitrite: NEGATIVE
Protein, ur: NEGATIVE mg/dL
Specific Gravity, Urine: 1.02 (ref 1.005–1.030)
pH: 5 (ref 5.0–8.0)

## 2023-01-15 LAB — CBC
HCT: 33.9 % — ABNORMAL LOW (ref 39.0–52.0)
Hemoglobin: 12 g/dL — ABNORMAL LOW (ref 13.0–17.0)
MCH: 31.8 pg (ref 26.0–34.0)
MCHC: 35.4 g/dL (ref 30.0–36.0)
MCV: 89.9 fL (ref 80.0–100.0)
Platelets: 185 10*3/uL (ref 150–400)
RBC: 3.77 MIL/uL — ABNORMAL LOW (ref 4.22–5.81)
RDW: 12.1 % (ref 11.5–15.5)
WBC: 7.1 10*3/uL (ref 4.0–10.5)
nRBC: 0 % (ref 0.0–0.2)

## 2023-01-15 LAB — BASIC METABOLIC PANEL
Anion gap: 9 (ref 5–15)
BUN: 35 mg/dL — ABNORMAL HIGH (ref 6–20)
CO2: 28 mmol/L (ref 22–32)
Calcium: 8 mg/dL — ABNORMAL LOW (ref 8.9–10.3)
Chloride: 97 mmol/L — ABNORMAL LOW (ref 98–111)
Creatinine, Ser: 1.38 mg/dL — ABNORMAL HIGH (ref 0.61–1.24)
GFR, Estimated: 59 mL/min — ABNORMAL LOW (ref >60)
Glucose, Bld: 122 mg/dL — ABNORMAL HIGH (ref 70–99)
Potassium: 3.9 mmol/L (ref 3.5–5.1)
Sodium: 134 mmol/L — ABNORMAL LOW (ref 135–145)

## 2023-01-15 LAB — TROPONIN I (HIGH SENSITIVITY): Troponin I (High Sensitivity): 10796 ng/L (ref <18)

## 2023-01-15 LAB — PROCALCITONIN: Procalcitonin: 1.4 ng/mL

## 2023-01-15 LAB — GLUCOSE, CAPILLARY: Glucose-Capillary: 153 mg/dL — ABNORMAL HIGH (ref 70–99)

## 2023-01-15 MED ORDER — AMIODARONE HCL 200 MG PO TABS
200.0000 mg | ORAL_TABLET | Freq: Two times a day (BID) | ORAL | Status: DC
  Administered 2023-01-15 – 2023-01-16 (×3): 200 mg via ORAL
  Filled 2023-01-15 (×3): qty 1

## 2023-01-15 MED ORDER — ATORVASTATIN CALCIUM 20 MG PO TABS
40.0000 mg | ORAL_TABLET | Freq: Once | ORAL | Status: AC
  Administered 2023-01-15: 40 mg via ORAL

## 2023-01-15 MED ORDER — FUROSEMIDE 20 MG PO TABS: 20.0000 mg | ORAL_TABLET | Freq: Every day | ORAL | Status: DC

## 2023-01-15 MED ORDER — FUROSEMIDE 10 MG/ML IJ SOLN
20.0000 mg | Freq: Once | INTRAMUSCULAR | Status: AC
  Administered 2023-01-15: 20 mg via INTRAVENOUS
  Filled 2023-01-15: qty 2

## 2023-01-15 MED ORDER — FUROSEMIDE 10 MG/ML IJ SOLN
40.0000 mg | Freq: Once | INTRAMUSCULAR | Status: AC
  Administered 2023-01-15: 40 mg via INTRAVENOUS
  Filled 2023-01-15: qty 4

## 2023-01-15 MED ORDER — AMIODARONE HCL 200 MG PO TABS: 200.0000 mg | ORAL_TABLET | Freq: Every day | ORAL | Status: DC

## 2023-01-15 MED ORDER — POTASSIUM CHLORIDE CRYS ER 20 MEQ PO TBCR
40.0000 meq | EXTENDED_RELEASE_TABLET | Freq: Once | ORAL | Status: AC
  Administered 2023-01-15: 40 meq via ORAL
  Filled 2023-01-15: qty 2

## 2023-01-15 MED ORDER — NITROGLYCERIN 0.4 MG SL SUBL: 0.4000 mg | SUBLINGUAL_TABLET | SUBLINGUAL | Status: DC | PRN

## 2023-01-15 MED ORDER — HEPARIN (PORCINE) IN NACL 1000-0.9 UT/500ML-% IV SOLN
INTRAVENOUS | Status: DC | PRN
  Administered 2023-01-12 (×2): 500 mL

## 2023-01-15 MED ORDER — IOHEXOL 300 MG/ML  SOLN
INTRAMUSCULAR | Status: DC | PRN
  Administered 2023-01-12: 163 mL

## 2023-01-15 NOTE — Progress Notes (Signed)
[facility] CLINIC CARDIOLOGY CONSULT NOTE       Patient ID: Keith Torres. MRN: [id number] DOB/AGE: [birth year] 57 y.o.  Admit date: 01/12/2023 Referring Physician Dr. Okey Dupre Primary Physician Dr. Rolin Barry  Primary Cardiologist previous Dr. Gwen Pounds  Reason for Consultation inferior STEMI  HPI: [name]. [name]. Is a 57yoM with a PMH of paroxysmal AF, HTN, OSA on CPAP, HLD who presented to [facility] ED 01/12/2023 with with on and off chest tightness 5 days prior to presentation, which became more severe and persistent with radiation to his shoulders and associated with shortness of breath.  Initial EKG showed inferolateral ST elevations and reciprocal depressions, taken for emergent LHC by Dr. Okey Dupre with successful PCI with DES to mid RCA, and improvement in the embolized thrombus to the PDA.  He required NE for blood pressure support following the PCI which has since been weaned off.  Hospital course also complicated by AF RVR, rate and rhythm controlled with metoprolol and amiodarone.  Echo this admission fortunately shows preserved LVEF 50-55% without RWMA's, mild MR, trivial AR.  Interval history: -Patient examined earlier a.m. with wife at bedside. -Reported shortness of breath worsened with laying flat and PND that woke him up last night.  Repeat chest x-ray showed vascular congestion and pulmonary edema, fortunately troponin is downtrending from >24000-10,796.  He was given 20 mg of IV Lasix with some clinical improvement and brisk urine output.  Remains in AF on telemetry, predominantly rate controlled in the 80s to 110s.  He is chest pain-free, describes his anginal symptom as epigastric burning mimicking "heartburn" -We discussed his new medications in detail and importance of outpatient clinic follow-up.  Review of systems complete and found to be negative unless listed above     Past Medical History:  Diagnosis Date   Anxiety    Atrial fibrillation (HCC)    Dental crowns present     FRONT TWO TEETH   Deviated septum    Dysrhythmia    A-FIB/ DR Gwen Pounds   Hyperlipidemia    Hypertrophy of nasal turbinates    Meningitis    Obesity    Rhinitis, allergic    Seasonal allergies    Sleep apnea    C-PAP    Past Surgical History:  Procedure Laterality Date   APPENDECTOMY  06/05/1972   BACK SURGERY  06/05/2013   COLONOSCOPY     COLONOSCOPY WITH PROPOFOL N/A 10/08/2015   Procedure: COLONOSCOPY WITH PROPOFOL;  Surgeon: Scot Jun, MD;  Location[facility] ENDOSCOPY;  Service: Endoscopy;  Laterality: N/A;   CORONARY ANGIOPLASTY WITH STENT PLACEMENT     CORONARY/GRAFT ACUTE MI REVASCULARIZATION N/A 01/12/2023   Procedure: Coronary/Graft Acute MI Revascularization;  Surgeon: Yvonne Kendall, MD;  Location: [facility] INVASIVE CV LAB;  Service: Cardiovascular;  Laterality: N/A;   HERNIA REPAIR Right 01/17/2001   Indirect hernia, Prolene hernia system.   HERNIA REPAIR Left 03/23/2014   Left indirect inguinal hernia repair, large ultra Pro mesh   LEFT HEART CATH AND CORONARY ANGIOGRAPHY N/A 01/12/2023   Procedure: LEFT HEART CATH AND CORONARY ANGIOGRAPHY;  Surgeon: Yvonne Kendall, MD;  Location: [facility] INVASIVE CV LAB;  Service: Cardiovascular;  Laterality: N/A;   NASAL TURBINATE REDUCTION Bilateral 05/21/2015   Procedure: TURBINATE REDUCTION/SUBMUCOSAL RESECTION;  Surgeon: Linus Salmons, MD;  Location: [facility] SURGERY CNTR;  Service: ENT;  Laterality: Bilateral;   SEPTOPLASTY N/A 05/21/2015   Procedure: SEPTOPLASTY;  Surgeon: Linus Salmons, MD;  Location: [facility] SURGERY [facility]TR;  Service: ENT;  Laterality: N/A;  C-PAP  Medications Prior to Admission  Medication Sig Dispense Refill Last Dose   flecainide (TAMBOCOR) 100 MG tablet Take 100 mg by mouth 2 (two) times daily. AM AND PM   01/11/2023   meloxicam (MOBIC) 7.5 MG tablet Take 1 tablet by mouth daily.   01/11/2023   albuterol (PROVENTIL HFA;VENTOLIN HFA) 108 (90 Base) MCG/ACT inhaler  (Patient not taking: Reported on 01/12/2023)    Not Taking   benzonatate (TESSALON) 200 MG capsule Take 1 capsule (200 mg total) by mouth at bedtime as needed for cough. (Patient not taking: Reported on 01/12/2023) 20 capsule 0 Not Taking   colchicine 0.6 MG tablet Take 2 tablets (1.2 mg) at onset of gout flare. Take 1 additional tablet (0.6 mg) one hour later if symptoms persist. Maximum dose is 1.8 mg (3 tablets) in 24 hours. Then take one tablet (0.6 mg) daily until symptoms resolve. (Patient not taking: Reported on 01/12/2023)   Not Taking   fluticasone (FLONASE) 50 MCG/ACT nasal spray Place 2 sprays into both nostrils daily. (Patient not taking: Reported on 01/12/2023) 16 g 1 Not Taking   metoprolol tartrate (LOPRESSOR) 25 MG tablet Take by mouth.      Spacer/Aero Chamber Mouthpiece MISC One spacer to use with albuterol inhaler. 1 each 0    Social History   Socioeconomic History   Marital status: Married    Spouse name: Not on file   Number of children: Not on file   Years of education: Not on file   Highest education level: Not on file  Occupational History   Not on file  Tobacco Use   Smoking status: Never   Smokeless tobacco: Former    Types: Chew    Quit date: 1999  Substance and Sexual Activity   Alcohol use: Not Currently    Comment: occasionally   Drug use: Never   Sexual activity: Yes    Partners: Female  Other Topics Concern   Not on file  Social History Narrative   Not on file   Social Determinants of Health   Financial Resource Strain: Not on file  Food Insecurity: No Food Insecurity (01/12/2023)   Hunger Vital Sign    Worried About Running Out of Food in the Last Year: Never true    Ran Out of Food in the Last Year: Never true  Transportation Needs: No Transportation Needs (01/12/2023)   PRAPARE - Administrator, Civil Service (Medical): No    Lack of Transportation (Non-Medical): No  Physical Activity: Not on file  Stress: Not on file  Social Connections: Not on file  Intimate Partner Violence:  Not At Risk (01/12/2023)   Humiliation, Afraid, Rape, and Kick questionnaire    Fear of Current or Ex-Partner: No    Emotionally Abused: No    Physically Abused: No    Sexually Abused: No    Family History  Problem Relation Age of Onset   Cancer Mother        colon   Heart disease Father    Hyperlipidemia Father    CAD Father       Intake/Output Summary (Last 24 hours) at 01/15/2023 1444 Last data filed at 01/15/2023 1048 Gross per 24 hour  Intake 200 ml  Output 700 ml  Net -500 ml    Vitals:   01/15/23 0450 01/15/23 0833 01/15/23 1040 01/15/23 1226  BP: (!) 113/90 99/66 105/73 100/69  Pulse: 94 99 95 100  Resp: 20 19  18   Temp: 98.2 F (36.8  C) 98.5 F (36.9 C)  97.6 F (36.4 C)  TempSrc:      SpO2: 100% 100% 96% 99%  Weight:      Height:        PHYSICAL EXAM General: Pleasant middle-age male, well nourished, in no acute distress.  Sitting upright in bed with wife at bedside. HEENT:  Normocephalic and atraumatic. Neck:  No JVD.  Lungs: Normal respiratory effort on room air. Clear bilaterally to auscultation. No wheezes, crackles, rhonchi.  Heart: Irregularly irregular with controlled rate. Normal S1 and S2 without gallops or murmurs.  Abdomen: Non-distended appearing.  Msk: Normal strength and tone for age. Extremities: Warm and well perfused. No clubbing, cyanosis.  No peripheral edema.  Neuro: Alert and oriented X 3. Psych:  Answers questions appropriately.   Labs: Basic Metabolic Panel: Recent Labs    01/14/23 0529 01/15/23 0503  NA 130* 134*  K 3.5 3.9  CL 96* 97*  CO2 24 28  GLUCOSE 142* 122*  BUN 31* 35*  CREATININE 1.29* 1.38*  CALCIUM 7.9* 8.0*   Liver Function Tests: No results for input(s): "AST", "ALT", "ALKPHOS", "BILITOT", "PROT", "ALBUMIN" in the last 72 hours. No results for input(s): "LIPASE", "AMYLASE" in the last 72 hours. CBC: Recent Labs    01/14/23 0529 01/15/23 0503  WBC 10.3 7.1  HGB 12.4* 12.0*  HCT 34.6* 33.9*  MCV  88.7 89.9  PLT 154 185   Cardiac Enzymes: Recent Labs    01/15/23 0503  TROPONINIHS 10,796*   BNP: No results for input(s): "BNP" in the last 72 hours. D-Dimer: No results for input(s): "DDIMER" in the last 72 hours. Hemoglobin A1C: Recent Labs    01/13/23 0421  HGBA1C 5.4   Fasting Lipid Panel: Recent Labs    01/13/23 0421  CHOL 149  HDL 54  LDLCALC 77  TRIG 90  CHOLHDL 2.8   Thyroid Function Tests: No results for input(s): "TSH", "T4TOTAL", "T3FREE", "THYROIDAB" in the last 72 hours.  Invalid input(s): "FREET3" Anemia Panel: No results for input(s): "VITAMINB12", "FOLATE", "FERRITIN", "TIBC", "IRON", "RETICCTPCT" in the last 72 hours.   Radiology: [facility] Chest Port 1 View  Result Date: 01/15/2023 CLINICAL DATA:  Shortness of breath. EXAM: PORTABLE CHEST 1 VIEW COMPARISON:  01/13/2023 FINDINGS: The cardio pericardial silhouette is enlarged. Vascular congestion with diffuse interstitial opacity suggests edema. No pneumothorax. No focal consolidation or substantial pleural effusion. No acute bony abnormality. IMPRESSION: Enlargement of the cardiopericardial silhouette with vascular congestion and diffuse interstitial opacity suggesting edema. Electronically Signed   By: Kennith Center M.D.   On: 01/15/2023 05:48   DG Chest Port 1 View  Result Date: 01/13/2023 CLINICAL DATA:  57 year old male with shortness of breath. EXAM: PORTABLE CHEST 1 VIEW COMPARISON:  Portable chest 01/12/2023 and earlier. FINDINGS: Portable AP upright view at 1122 hours. Chronically low lung volumes. Mediastinal contours remain normal. Visualized tracheal air column is within normal limits. Allowing for portable technique the lungs are clear. No pneumothorax or pleural effusion. No acute osseous abnormality identified. Negative visible bowel gas. IMPRESSION: No acute cardiopulmonary abnormality. Electronically Signed   By: Odessa Fleming M.D.   On: 01/13/2023 11:28   ECHOCARDIOGRAM COMPLETE  Result Date:  01/12/2023    ECHOCARDIOGRAM REPORT   Patient Name:   Kazimir Devisser. Date of Exam: 01/12/2023 Medical Rec #:  [id number]            Height:       74.0 in Accession #:    [id number]  Weight:       252.2 lb Date of Birth:  05-Aug-1965            BSA:          2.399 m Patient Age:    57 years             BP:           111/54 mmHg Patient Gender: M                    HR:           95 bpm. Exam Location:  [facility] Procedure: 2D Echo, Cardiac Doppler and Color Doppler Indications:     Acute myocardial Infarction --unspecified I21.9  History:         Patient has no prior history of Echocardiogram examinations.                  Arrythmias:Atrial Fibrillation; Risk Factors:Dyslipidemia and                  Sleep Apnea.  Sonographer:     Cristela Blue Referring Phys:  Debbe Odea MD Diagnosing Phys: Debbe Odea MD  Sonographer Comments: Technically challenging study due to limited acoustic windows and no parasternal window. IMPRESSIONS  1. Left ventricular ejection fraction, by estimation, is 50 to 55%. Left ventricular ejection fraction by PLAX is 51 %. The left ventricle has low normal function. The left ventricle has no regional wall motion abnormalities. There is mild left ventricular hypertrophy. Left ventricular diastolic parameters are indeterminate.  2. Right ventricular systolic function is low normal. The right ventricular size is mildly enlarged. There is normal pulmonary artery systolic pressure.  3. The mitral valve is normal in structure. Mild mitral valve regurgitation.  4. The aortic valve was not well visualized. Aortic valve regurgitation is trivial.  5. The inferior vena cava is dilated in size with >50% respiratory variability, suggesting right atrial pressure of 8 mmHg. FINDINGS  Left Ventricle: Left ventricular ejection fraction, by estimation, is 50 to 55%. Left ventricular ejection fraction by PLAX is 51 %. The left ventricle has low normal function. The left ventricle has no regional  wall motion abnormalities. The left ventricular internal cavity size was normal in size. There is mild left ventricular hypertrophy. Left ventricular diastolic parameters are indeterminate. Right Ventricle: The right ventricular size is mildly enlarged. No increase in right ventricular wall thickness. Right ventricular systolic function is low normal. There is normal pulmonary artery systolic pressure. The tricuspid regurgitant velocity is 2.12 m/s, and with an assumed right atrial pressure of 8 mmHg, the estimated right ventricular systolic pressure is 26.0 mmHg. Left Atrium: Left atrial size was normal in size. Right Atrium: Right atrial size was normal in size. Pericardium: There is no evidence of pericardial effusion. Mitral Valve: The mitral valve is normal in structure. Mild mitral valve regurgitation. Tricuspid Valve: The tricuspid valve is normal in structure. Tricuspid valve regurgitation is not demonstrated. Aortic Valve: The aortic valve was not well visualized. Aortic valve regurgitation is trivial. Aortic valve mean gradient measures 4.0 mmHg. Aortic valve peak gradient measures 7.5 mmHg. Aortic valve area, by VTI measures 2.58 cm. Pulmonic Valve: The pulmonic valve was not well visualized. Pulmonic valve regurgitation is not visualized. Aorta: The aortic root is normal in size and structure. Venous: The inferior vena cava is dilated in size with greater than 50% respiratory variability, suggesting right atrial pressure of 8 mmHg. IAS/Shunts: No atrial level shunt  detected by color flow Doppler.  LEFT VENTRICLE PLAX 2D LV EF:         Left ventricular ejection fraction by PLAX is 51 %. LVIDd:         3.90 cm LVIDs:         2.90 cm LV PW:         0.90 cm LV IVS:        1.40 cm LVOT diam:     2.20 cm LV SV:         46 LV SV Index:   19 LVOT Area:     3.80 cm  RIGHT VENTRICLE RV Basal diam:  4.40 cm RV Mid diam:    2.70 cm RV S prime:     11.50 cm/s TAPSE (M-mode): 1.9 cm LEFT ATRIUM             Index         RIGHT ATRIUM           Index LA diam:        3.00 cm 1.25 cm/m   RA Area:     18.70 cm LA Vol (A2C):   44.9 ml 18.72 ml/m  RA Volume:   52.60 ml  21.93 ml/m LA Vol (A4C):   60.8 ml 25.35 ml/m LA Biplane Vol: 57.3 ml 23.89 ml/m  AORTIC VALVE AV Area (Vmax):    2.46 cm AV Area (Vmean):   2.47 cm AV Area (VTI):     2.58 cm AV Vmax:           137.00 cm/s AV Vmean:          91.200 cm/s AV VTI:            0.178 m AV Peak Grad:      7.5 mmHg AV Mean Grad:      4.0 mmHg LVOT Vmax:         88.60 cm/s LVOT Vmean:        59.200 cm/s LVOT VTI:          0.121 m LVOT/AV VTI ratio: 0.68  AORTA Ao Root diam: 2.80 cm MITRAL VALVE               TRICUSPID VALVE MV Area (PHT): 3.30 cm    TR Peak grad:   18.0 mmHg MV Decel Time: 230 msec    TR Vmax:        212.00 cm/s MV E velocity: 78.60 cm/s                            SHUNTS                            Systemic VTI:  0.12 m                            Systemic Diam: 2.20 cm Debbe Odea MD Electronically signed by Debbe Odea MD Signature Date/Time: 01/12/2023/3:59:22 PM    Final    CARDIAC CATHETERIZATION  Result Date: 01/12/2023 Conclusions: Severe single-vessel coronary artery disease with thrombotic occlusion of mid RCA involving acute marginal branch as well as embolized thrombus in the RPDA.  There is also moderate, nonobstructive mid LAD disease of up to 50%. Moderately reduced left ventricular systolic function (LVEF 35-45%) with inferior akinesis. Mildly elevated left ventricular filling pressure (LVEDP 20 mmHg). Challenging but  successful PCI to occluded mid RCA using Onyx frontier 3.0 x 18 mm drug-eluting stent with 0% residual stenosis and TIMI-3 flow.  Embolized thrombus in the PDA improved with angioplasty, though the distal vessel remained occluded at the end of the case.  Difficulty crossing the mid RCA occlusion consistent with organizing thrombus in the setting of late presenting STEMI. Recommendations: Wean off norepinephrine as tolerated.   Suspect acute RV infarction from RCA/acute marginal disease may be contributing to hypotension. Continue tirofiban infusion for 18 hours.  Recommend starting heparin infusion tomorrow after discontinuation of tirofiban. Given atrial fibrillation and CHA2DS2-VASc score of at least 2 (coronary artery disease and cardiomyopathy), the patient would benefit from long-term anticoagulation.  Favor transitioning from ticagrelor to clopidogrel prior to discharge, treating with aspirin, clopidogrel, and apixaban for 1 week, and subsequently maintaining apixaban and clopidogrel for 12 months. Aggressive secondary prevention of coronary artery disease. Follow-up echocardiogram. Yvonne Kendall, MD [facility]  DG Chest Port 1 View  Result Date: 01/12/2023 CLINICAL DATA:  Chest pain. EXAM: PORTABLE CHEST 1 VIEW COMPARISON:  X-ray 05/30/2018 FINDINGS: No consolidation, pneumothorax or effusion. Normal cardiopericardial silhouette without edema. Overlapping cardiac leads and defibrillator pads. Old right-sided rib fracture. IMPRESSION: No acute cardiopulmonary disease. Electronically Signed   By: Karen Kays M.D.   On: 01/12/2023 11:17     TELEMETRY reviewed by me (LT) 01/15/2023 : Atrial fibrillation rate 80s to 110s  EKG reviewed by me: Atrial fibrillation with resolving inferior STE without new ischemic changes.  Data reviewed by me (LT) 01/15/2023: Previous cardiology notes, hospitalist progress note last 24h vitals tele labs imaging I/O   Principal Problem:   STEMI involving right coronary artery [facility]) Active Problems:   Anxiety   Breathlessness on exertion   Adiposity   Paroxysmal atrial fibrillation (HCC)   ST elevation myocardial infarction involving right coronary artery (HCC)   AKI (acute kidney injury) (HCC)   CAD S/P percutaneous coronary angioplasty    ASSESSMENT AND PLAN:  Jhaden Drotar. [name]. Is a 57yoM with a PMH of paroxysmal AF, HTN, OSA on CPAP, HLD who presented to [facility] ED 01/12/2023  with with on and off chest tightness 5 days prior to presentation, which became more severe and persistent with radiation to his shoulders and associated with shortness of breath.  Initial EKG showed inferolateral ST elevations and reciprocal depressions, taken for emergent LHC by Dr. Okey Dupre with successful PCI with DES to mid RCA, and improvement in the embolized thrombus to the PDA.  He required NE for blood pressure support following the PCI which has since been weaned off.  Hospital course also complicated by AF RVR, rate and rhythm controlled with metoprolol and amiodarone.  Echo this admission fortunately shows preserved LVEF 50-55% without RWMA's, mild MR, trivial AR.  # Inferior STEMI Angina free today, repeat EKG shows resolving ST elevations. -Continue triple therapy with aspirin 81 mg, clopidogrel 75 mg, and Eliquis 5 mg twice daily for 2 weeks, then stop aspirin and continue clopidogrel and Eliquis. -Atorvastatin 40 mg daily -Metoprolol tartrate 50 mg, likely change to twice daily dosing by discharge -Lisinopril 5 mg daily -Cardiac rehab  # Paroxysmal AF RVR Better rate control today in the 80s to 90s in AF on telemetry.  He feels somewhat symptomatic with generalized weakness but no heart racing or palpitations. -Change IV amiodarone to p.o. amiodarone 200 mg twice daily x 10 days, then 200 mg daily thereafter -Continue metoprolol tartrate -Continue Eliquis 5 mg twice daily for stroke  prevention.  CHA2DS2-VASc 1 (CAD).  -Will need close outpatient follow-up for consideration of DCCV following 4 weeks of appropriate anticoagulation, should the patient remain in atrial fibrillation.  # Acute on chronic HFpEF Symptomatic with orthopnea and PND last night, not clinically hypervolemic on exam but is not hypoxic. -Received 20 mg of IV Lasix earlier a.m., will dose another 40 mg IV around lunchtime. -Possible discharge home on 40 mg of p.o. Lasix daily.  Anticipate discharge readiness from a  cardiac perspective, tomorrow morning 8/13.  Will arrange for close outpatient follow-up with Dr. Juliann Pares.  This patient's plan of care was discussed and created with Dr. Juliann Pares and he is in agreement.  Signed: Rebeca Allegra , PA-C 01/15/2023, 2:44 PM [facility] Cardiology

## 2023-01-15 NOTE — Progress Notes (Signed)
PROGRESS NOTE    Keith Torres.  LKG:[id number] DOB: [birth year] DOA: 01/12/2023 PCP: Dione Housekeeper, MD    Brief Narrative:  57 year old male with history of hypertension, atrial fibrillation was on flecainide, who presents to the emergency department for chief concerns of chest pressure, vomiting, abdominal distention.  High sensitive troponin is greater than 24,000 EKG with inferolateral STEMI.  STEMI cardiologist called to bedside.  Patient taken emergently to Cath Lab for PCI.  Status post PCI with DES to mid RCA.  Admitted to ICU on tirofiban infusion post procedurally.  Required vasopressor support for short period of time.  8/10: Doing well overall.  In rapid atrial fibrillation.  Reports of intermittent chest tightness.  Vital signs stable.  Wife at bedside.  8/11: Remains in rapid atrial fibrillation.  Complains of cough 8/12: Remains in atrial fibrillation.  Rate control improved.  Had some episodes of chest pain overnight.  Assessment & Plan:   Principal Problem:   STEMI involving right coronary artery (HCC) Active Problems:   Anxiety   Breathlessness on exertion   Adiposity   Paroxysmal atrial fibrillation (HCC)   ST elevation myocardial infarction involving right coronary artery (HCC)   AKI (acute kidney injury) (HCC)   CAD S/P percutaneous coronary angioplasty   STEMI involving right coronary artery (HCC) Status post left heart cath with PCI to RCA Per cardiology recommendation, continue to tirofiban infusion for 18 hours followed by heparin infusion Complete echo ordered Plan: On Eliquis 5 mg twice daily Aggressive goal-directed medical therapy Per cardiology recommendations aspirin Plavix and apixaban x 1 week followed by apixaban and clopidogrel for 12 months Cardiology follow-up Telemetry monitoring EKG as needed chest pain Anticipate DC 8/13   CAD S/P percutaneous coronary angioplasty Recommended apixaban and clopidogrel for 12 months post  discharge Aggressive secondary coronary artery disease prevention   AKI (acute kidney injury) (HCC) Suspect secondary to cardiorenal Kidney function returned to baseline   Paroxysmal atrial fibrillation (HCC) Patient remains on amiodarone gtt.  Switch to p.o. Eliquis.  Cannot use flecainide given ischemic disease Plan: P.o. amiodarone now per cardiology Metoprolol 50 mg every 8 hours Telemetry monitoring  Dyspnea on exertion Secondary to STEMI, treat per above   Anxiety As needed IV Ativan   DVT prophylaxis: Eliquis Code Status: Full Family Communication: Spouse at bedside 8/10, 8/11, 8/12 Disposition Plan: Status is: Inpatient Remains inpatient appropriate because: STEMI.  Rapid atrial fibrillation.  Discharge into dated 8/13   Level of care: Progressive  Consultants:  Cardiology  Procedures:  Left heart catheterization.  PCI with DES 8/9  Antimicrobials: None   Subjective: Seen and examined.  Shortness of breath.  Objective: Vitals:   01/15/23 0438 01/15/23 0450 01/15/23 0833 01/15/23 1040  BP: 103/79 (!) 113/90 99/66 105/73  Pulse: 87 94 99 95  Resp: (!) 22 20 19    Temp: 98.3 F (36.8 C) 98.2 F (36.8 C) 98.5 F (36.9 C)   TempSrc:      SpO2: 96% 100% 100% 96%  Weight:      Height:        Intake/Output Summary (Last 24 hours) at 01/15/2023 1201 Last data filed at 01/15/2023 1048 Gross per 24 hour  Intake 200 ml  Output 700 ml  Net -500 ml   Filed Weights   01/12/23 1234 01/13/23 0453 01/14/23 0716  Weight: 114.4 kg 112.3 kg 111.6 kg    Examination:  General exam: No acute distress Respiratory system: Lungs clear.  Normal work of breathing.  Room air Cardiovascular system: S1-2, regular rate, irregular rhythm, no murmurs, no pedal edema Gastrointestinal system: Soft, NT/ND, normal bowel sounds Central nervous system: Alert and oriented. No focal neurological deficits. Extremities: Symmetric 5 x 5 power. Skin: No rashes, lesions or  ulcers Psychiatry: Judgement and insight appear normal. Mood & affect appropriate.     Data Reviewed: I have personally reviewed following labs and imaging studies  CBC: Recent Labs  Lab 01/12/23 1008 01/13/23 0421 01/14/23 0529 01/15/23 0503  WBC 14.6* 11.3* 10.3 7.1  HGB 15.4 14.2 12.4* 12.0*  HCT 42.8 39.8 34.6* 33.9*  MCV 87.2 89.6 88.7 89.9  PLT 195 172 154 185   Basic Metabolic Panel: Recent Labs  Lab 01/12/23 1008 01/13/23 0421 01/14/23 0529 01/15/23 0503  NA 135 132* 130* 134*  K 4.0 4.0 3.5 3.9  CL 99 97* 96* 97*  CO2 24 26 24 28   GLUCOSE 182* 173* 142* 122*  BUN 18 19 31* 35*  CREATININE 1.29* 1.12 1.29* 1.38*  CALCIUM 8.9 8.5* 7.9* 8.0*   GFR: Estimated Creatinine Clearance: 77.6 mL/min (A) (by C-G formula based on SCr of 1.38 mg/dL (H)). Liver Function Tests: No results for input(s): "AST", "ALT", "ALKPHOS", "BILITOT", "PROT", "ALBUMIN" in the last 168 hours. Recent Labs  Lab 01/12/23 1008  LIPASE 27   No results for input(s): "AMMONIA" in the last 168 hours. Coagulation Profile: No results for input(s): "INR", "PROTIME" in the last 168 hours. Cardiac Enzymes: No results for input(s): "CKTOTAL", "CKMB", "CKMBINDEX", "TROPONINI" in the last 168 hours. BNP (last 3 results) No results for input(s): "PROBNP" in the last 8760 hours. HbA1C: Recent Labs    01/13/23 0421  HGBA1C 5.4   CBG: Recent Labs  Lab 01/12/23 1236  GLUCAP 166*   Lipid Profile: Recent Labs    01/13/23 0421  CHOL 149  HDL 54  LDLCALC 77  TRIG 90  CHOLHDL 2.8   Thyroid Function Tests: No results for input(s): "TSH", "T4TOTAL", "FREET4", "T3FREE", "THYROIDAB" in the last 72 hours. Anemia Panel: No results for input(s): "VITAMINB12", "FOLATE", "FERRITIN", "TIBC", "IRON", "RETICCTPCT" in the last 72 hours. Sepsis Labs: No results for input(s): "PROCALCITON", "LATICACIDVEN" in the last 168 hours.  Recent Results (from the past 240 hour(s))  MRSA Next Gen by PCR,  Nasal     Status: None   Collection Time: 01/12/23 12:37 PM   Specimen: Nasal Mucosa; Nasal Swab  Result Value Ref Range Status   MRSA by PCR Next Gen NOT DETECTED NOT DETECTED Final    Comment: (NOTE) The GeneXpert MRSA Assay (FDA approved for NASAL specimens only), is one component of a comprehensive MRSA colonization surveillance program. It is not intended to diagnose MRSA infection nor to guide or monitor treatment for MRSA infections. Test performance is not FDA approved in patients less than 58 years old. Performed at [facility], [address] [place], [place] [address]          Radiology Studies: [facility] Chest [place] 1 View  Result Date: 01/15/2023 CLINICAL DATA:  Shortness of breath. EXAM: PORTABLE CHEST 1 VIEW COMPARISON:  01/13/2023 FINDINGS: The cardio pericardial silhouette is enlarged. Vascular congestion with diffuse interstitial opacity suggests edema. No pneumothorax. No focal consolidation or substantial pleural effusion. No acute bony abnormality. IMPRESSION: Enlargement of the cardiopericardial silhouette with vascular congestion and diffuse interstitial opacity suggesting edema. Electronically Signed   By: Kennith Center M.D.   On: 01/15/2023 05:48        Scheduled Meds:  amiodarone  200 mg Oral BID   Followed by   Melene Muller ON 01/25/2023] amiodarone  200 mg Oral Daily   apixaban  5 mg Oral BID   aspirin  81 mg Oral Daily   atorvastatin  40 mg Oral Daily   benzonatate  200 mg Oral TID   Chlorhexidine Gluconate Cloth  6 each Topical Q0600   chlorpheniramine-HYDROcodone  5 mL Oral Q12H   clopidogrel  75 mg Oral Daily   furosemide  40 mg Intravenous Once   lisinopril  2.5 mg Oral BID   metoprolol tartrate  50 mg Oral Q8H   potassium chloride  40 mEq Oral Once   Continuous Infusions:     LOS: 3 days     Tresa Moore, MD [facility]   If 7PM-7AM, please contact night-coverage  01/15/2023, 12:01 PM

## 2023-01-15 NOTE — Evaluation (Signed)
Physical Therapy Evaluation Patient Details Name: Keith Torres. MRN: [id number] DOB: 12-24-1965 Today's Date: 01/15/2023  History of Present Illness  Pt is a 57 y/o M admitted on 01/12/23 after presenting with c/o chest pressure, vomiting, abdominal distension. Pt found to have high sensitive troponin is greater than 24,000 EKG with inferolateral STEMI. Pt taken emergently to cath lab. PMH: HTN, a-fib, anxiety  Clinical Impression  Pt seen for PT evaluation with pt agreeable to tx. Pt reports prior to admission he was independent without AD, driving, working. On this date, pt is able to ambulate 1 lap + 1 lap around nurses station without AD with supervision. Pt with progressive steadiness as gait distance progresses. Pt does c/o lightheadedness during first lap but HR 93 bpm. Pt is doing well overall, and anticipate pt will continue to progress as he continues to mobilize - educated pt on need to mobilize with wife & does not require further formal PT services. Pt & wife voice understanding. PT to complete current orders at this time, please re-consult if new needs arise.      If plan is discharge home, recommend the following: Help with stairs or ramp for entrance;Assist for transportation   Can travel by private vehicle        Equipment Recommendations None recommended by PT  Recommendations for Other Services       Functional Status Assessment Patient has had a recent decline in their functional status and demonstrates the ability to make significant improvements in function in a reasonable and predictable amount of time.     Precautions / Restrictions Precautions Precaution Comments: s/p cardiac cath 8/9 (RUE) Restrictions Weight Bearing Restrictions: No      Mobility  Bed Mobility Overal bed mobility: Modified Independent Bed Mobility: Supine to Sit     Supine to sit: Modified independent (Device/Increase time), HOB elevated          Transfers Overall transfer  level: Independent Equipment used: None               General transfer comment: STS from EOB    Ambulation/Gait Ambulation/Gait assistance: Supervision Gait Distance (Feet): 170 Feet (+ 170 ft) Assistive device: None Gait Pattern/deviations: Decreased step length - right, Decreased step length - left, Decreased stride length, Decreased dorsiflexion - right, Decreased dorsiflexion - left Gait velocity: slightly decreased     General Gait Details: Pt ambulates 1 lap around nurses station with 1 standing rest break, 2nd lap without break. Slightly decreased gait speed.  Stairs            Wheelchair Mobility     Tilt Bed    Modified Rankin (Stroke Patients Only)       Balance Overall balance assessment: Mild deficits observed, not formally tested                                           Pertinent Vitals/Pain Pain Assessment Pain Assessment: No/denies pain    Home Living Family/patient expects to be discharged to:: Private residence Living Arrangements: Spouse/significant other Available Help at Discharge: Family;Available 24 hours/day Type of Home: House Home Access: Stairs to enter Entrance Stairs-Rails: None Entrance Stairs-Number of Steps: 2   Home Layout: One level Home Equipment: None      Prior Function Prior Level of Function : Independent/Modified Independent;Working/employed;Driving  Mobility Comments: Works as a Music therapist, 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       Extremity/Trunk Assessment   Upper Extremity Assessment Upper Extremity Assessment: Overall WFL for tasks assessed    Lower Extremity Assessment Lower Extremity Assessment: Overall WFL for tasks assessed;Generalized weakness    Cervical / Trunk Assessment Cervical / Trunk Assessment: Normal  Communication   Communication Communication: No apparent difficulties  Cognition Arousal: Alert Behavior  During Therapy: WFL for tasks assessed/performed Overall Cognitive Status: Within Functional Limits for tasks assessed                                          General Comments General comments (skin integrity, edema, etc.): HR 93-114 bpm, O2 >/= 90% on room air    Exercises     Assessment/Plan    PT Assessment Patient does not need any further PT services  PT Problem List         PT Treatment Interventions      PT Goals (Current goals can be found in the Care Plan section)  Acute Rehab PT Goals Patient Stated Goal: get better, go home PT Goal Formulation: With patient Time For Goal Achievement: 01/29/23 Potential to Achieve Goals: Good    Frequency       Co-evaluation               AM-PAC PT "6 Clicks" Mobility  Outcome Measure Help needed turning from your back to your side while in a flat bed without using bedrails?: None Help needed moving from lying on your back to sitting on the side of a flat bed without using bedrails?: None Help needed moving to and from a bed to a chair (including a wheelchair)?: None Help needed standing up from a chair using your arms (e.g., wheelchair or bedside chair)?: None Help needed to walk in hospital room?: None Help needed climbing 3-5 steps with a railing? : None 6 Click Score: 24    End of Session   Activity Tolerance: Patient tolerated treatment well Patient left: in bed;with call bell/phone within reach   PT Visit Diagnosis: Unsteadiness on feet (R26.81);Muscle weakness (generalized) (M62.81)    Time: 9563-8756 PT Time Calculation (min) (ACUTE ONLY): 22 min   Charges:   PT Evaluation $PT Eval Low Complexity: 1 Low   PT General Charges $$ ACUTE PT VISIT: 1 Visit         Aleda Grana, PT, DPT 01/15/23, 12:59 PM   Sandi Mariscal 01/15/2023, 12:51 PM

## 2023-01-15 NOTE — Plan of Care (Signed)
  Problem: Education: Goal: Knowledge of General Education information will improve Description: Including pain rating scale, medication(s)/side effects and non-pharmacologic comfort measures Outcome: Progressing   Problem: Health Behavior/Discharge Planning: Goal: Ability to manage health-related needs will improve Outcome: Progressing   Problem: Nutrition: Goal: Adequate nutrition will be maintained Outcome: Progressing   Problem: Coping: Goal: Level of anxiety will decrease Outcome: Progressing   Problem: Elimination: Goal: Will not experience complications related to urinary retention Outcome: Progressing   Problem: Pain Managment: Goal: General experience of comfort will improve Outcome: Progressing   Problem: Safety: Goal: Ability to remain free from injury will improve Outcome: Progressing   Problem: Activity: Goal: Risk for activity intolerance will decrease Outcome: Not Progressing

## 2023-01-15 NOTE — Progress Notes (Signed)
Called for the patient was having mild dyspnea without chest pain.  No cough or wheezing or hemoptysis.  No fever or chills.  Patient was seen and examined.  Physical exam: Vital signs revealed BP 113/90 with heart rate of 94, respiratory rate of 20, temperature 98.2 and pulse symmetry 100% on 2 L of O2 by nasal cannula.  Cardiovascular revealed regular rate rhythm with normal S1-S2 and no murmurs gallops or rubs.  Lungs were clear to auscultation bilaterally.  Abdomen was soft nontender with plus bowel sounds.  Extremities without edema clubbing or cyanosis.  Repeat EKG showed improving inferior ST segment elevation without other acute changes.  Repeat troponins were ordered.  20 mg of IV Lasix as well as portable chest x-ray were ordered.  Patient had no worsening of his O2 saturation.  Will follow results and continue monitoring.

## 2023-01-16 ENCOUNTER — Inpatient Hospital Stay: Payer: BC Managed Care – PPO

## 2023-01-16 DIAGNOSIS — I2111 ST elevation (STEMI) myocardial infarction involving right coronary artery: Secondary | ICD-10-CM | POA: Diagnosis not present

## 2023-01-16 LAB — BASIC METABOLIC PANEL
Anion gap: 10 (ref 5–15)
BUN: 31 mg/dL — ABNORMAL HIGH (ref 6–20)
CO2: 27 mmol/L (ref 22–32)
Calcium: 8.2 mg/dL — ABNORMAL LOW (ref 8.9–10.3)
Chloride: 97 mmol/L — ABNORMAL LOW (ref 98–111)
Creatinine, Ser: 1.27 mg/dL — ABNORMAL HIGH (ref 0.61–1.24)
GFR, Estimated: >60 mL/min (ref >60)
Glucose, Bld: 149 mg/dL — ABNORMAL HIGH (ref 70–99)
Potassium: 3.1 mmol/L — ABNORMAL LOW (ref 3.5–5.1)
Sodium: 134 mmol/L — ABNORMAL LOW (ref 135–145)

## 2023-01-16 LAB — CBC
HCT: 35.6 % — ABNORMAL LOW (ref 39.0–52.0)
Hemoglobin: 12.9 g/dL — ABNORMAL LOW (ref 13.0–17.0)
MCH: 31.5 pg (ref 26.0–34.0)
MCHC: 36.2 g/dL — ABNORMAL HIGH (ref 30.0–36.0)
MCV: 87 fL (ref 80.0–100.0)
Platelets: 251 10*3/uL (ref 150–400)
RBC: 4.09 MIL/uL — ABNORMAL LOW (ref 4.22–5.81)
RDW: 11.9 % (ref 11.5–15.5)
WBC: 6.7 10*3/uL (ref 4.0–10.5)
nRBC: 0 % (ref 0.0–0.2)

## 2023-01-16 LAB — MAGNESIUM: Magnesium: 2.9 mg/dL — ABNORMAL HIGH (ref 1.7–2.4)

## 2023-01-16 MED ORDER — CLOPIDOGREL BISULFATE 75 MG PO TABS: 75.0000 mg | ORAL_TABLET | Freq: Every day | ORAL | 1 refills | Status: AC

## 2023-01-16 MED ORDER — AMIODARONE HCL 200 MG PO TABS: ORAL_TABLET | ORAL | 0 refills | Status: DC

## 2023-01-16 MED ORDER — AMOXICILLIN-POT CLAVULANATE 875-125 MG PO TABS: 1.0000 | ORAL_TABLET | Freq: Two times a day (BID) | ORAL | 0 refills | Status: AC

## 2023-01-16 MED ORDER — FUROSEMIDE 20 MG PO TABS: 20.0000 mg | ORAL_TABLET | Freq: Every day | ORAL | Status: DC

## 2023-01-16 MED ORDER — METOPROLOL TARTRATE 25 MG PO TABS
25.0000 mg | ORAL_TABLET | Freq: Four times a day (QID) | ORAL | Status: DC
  Administered 2023-01-16: 25 mg via ORAL

## 2023-01-16 MED ORDER — ATORVASTATIN CALCIUM 40 MG PO TABS: 40.0000 mg | ORAL_TABLET | Freq: Every day | ORAL | 1 refills | Status: DC

## 2023-01-16 MED ORDER — APIXABAN 5 MG PO TABS: 5.0000 mg | ORAL_TABLET | Freq: Two times a day (BID) | ORAL | 1 refills | Status: DC

## 2023-01-16 MED ORDER — POTASSIUM CHLORIDE CRYS ER 20 MEQ PO TBCR
60.0000 meq | EXTENDED_RELEASE_TABLET | Freq: Once | ORAL | Status: AC
  Administered 2023-01-16: 60 meq via ORAL
  Filled 2023-01-16: qty 3

## 2023-01-16 MED ORDER — SODIUM CHLORIDE 0.9 % IV SOLN
1.0000 g | INTRAVENOUS | Status: DC
  Administered 2023-01-16: 1 g via INTRAVENOUS
  Filled 2023-01-16: qty 10

## 2023-01-16 MED ORDER — METOPROLOL TARTRATE 50 MG PO TABS: 50.0000 mg | ORAL_TABLET | Freq: Two times a day (BID) | ORAL | Status: DC

## 2023-01-16 MED ORDER — METOPROLOL TARTRATE 50 MG PO TABS: 50.0000 mg | ORAL_TABLET | Freq: Two times a day (BID) | ORAL | 1 refills | Status: DC

## 2023-01-16 MED ORDER — METOPROLOL TARTRATE 25 MG PO TABS: 25.0000 mg | ORAL_TABLET | Freq: Three times a day (TID) | ORAL | Status: DC

## 2023-01-16 MED ORDER — SIMETHICONE 80 MG PO CHEW: 80.0000 mg | CHEWABLE_TABLET | Freq: Four times a day (QID) | ORAL | 0 refills | Status: DC

## 2023-01-16 MED ORDER — METOPROLOL TARTRATE 25 MG PO TABS
25.0000 mg | ORAL_TABLET | Freq: Four times a day (QID) | ORAL | Status: AC
  Administered 2023-01-16: 25 mg via ORAL
  Filled 2023-01-16: qty 1

## 2023-01-16 MED ORDER — ASPIRIN 81 MG PO CHEW: 81.0000 mg | CHEWABLE_TABLET | Freq: Every day | ORAL | 0 refills | Status: AC

## 2023-01-16 MED ORDER — FLEET ENEMA RE ENEM
1.0000 | ENEMA | Freq: Once | RECTAL | Status: AC
  Administered 2023-01-16: 1 via RECTAL

## 2023-01-16 MED ORDER — FUROSEMIDE 20 MG PO TABS: 20.0000 mg | ORAL_TABLET | Freq: Every day | ORAL | 1 refills | Status: DC

## 2023-01-16 MED ORDER — MIDODRINE HCL 5 MG PO TABS
10.0000 mg | ORAL_TABLET | Freq: Three times a day (TID) | ORAL | Status: DC
  Administered 2023-01-16 (×3): 10 mg via ORAL
  Filled 2023-01-16 (×4): qty 2

## 2023-01-16 MED ORDER — SIMETHICONE 80 MG PO CHEW
80.0000 mg | CHEWABLE_TABLET | Freq: Four times a day (QID) | ORAL | Status: DC
  Administered 2023-01-16: 80 mg via ORAL
  Filled 2023-01-16: qty 1

## 2023-01-16 MED ORDER — BENZONATATE 200 MG PO CAPS
200.0000 mg | ORAL_CAPSULE | Freq: Three times a day (TID) | ORAL | 0 refills | Status: DC | PRN
Start: 2023-01-16 — End: 2023-05-02

## 2023-01-16 MED ORDER — BISACODYL 10 MG RE SUPP: 10.0000 mg | Freq: Every day | RECTAL | Status: DC | PRN

## 2023-01-16 NOTE — Progress Notes (Signed)
MEWS Progress Note  Patient Details Name: Keith Torres. MRN: [id number] DOB: Oct 08, 1965 Today'Keith Date: 01/16/2023   MEWS Flowsheet Documentation:  Assess: MEWS Score Temp: 98.8 F (37.1 C) BP: (!) 87/56 MAP (mmHg): 66 Pulse Rate: (!) 103 ECG Heart Rate: 90 Resp: 20 Level of Consciousness: Alert SpO2: 99 % O2 Device: CPAP Patient Activity (if Appropriate): In bed O2 Flow Rate (L/min): 2 L/min FiO2 (%): 21 % Assess: MEWS Score MEWS Temp: 0 MEWS Systolic: 1 MEWS Pulse: 0 MEWS RR: 0 MEWS LOC: 0 MEWS Score: 1 MEWS Score Color: Green Assess: SIRS CRITERIA SIRS Temperature : 0 SIRS Respirations : 0 SIRS Pulse: 0 SIRS WBC: 0 SIRS Score Sum : 0 SIRS Temperature : 0 SIRS Pulse: 0 SIRS Respirations : 0 SIRS WBC: 0 SIRS Score Sum : 0 Assess: if the MEWS score is Yellow or Red Were vital signs accurate and taken at a resting state?: Yes Does the patient meet 2 or more of the SIRS criteria?: No MEWS guidelines implemented : Yes, yellow Treat MEWS Interventions: Considered administering scheduled or prn medications/treatments as ordered Take Vital Signs Increase Vital Sign Frequency : Yellow: Q2hr x1, continue Q4hrs until patient remains green for 12hrs Escalate MEWS: Escalate: Yellow: Discuss with charge nurse and consider notifying provider and/or RRT     Dr.Mansy notified-see MAR-will recheck vitals   Keith Torres Keith Torres 01/16/2023, 2:29 AM

## 2023-01-16 NOTE — Plan of Care (Signed)
  Problem: Education: Goal: Knowledge of General Education information will improve Description: Including pain rating scale, medication(s)/side effects and non-pharmacologic comfort measures Outcome: Progressing   Problem: Health Behavior/Discharge Planning: Goal: Ability to manage health-related needs will improve Outcome: Progressing   Problem: Clinical Measurements: Goal: Ability to maintain clinical measurements within normal limits will improve Outcome: Progressing Goal: Will remain free from infection Outcome: Progressing Goal: Diagnostic test results will improve Outcome: Progressing Goal: Respiratory complications will improve Outcome: Progressing Goal: Cardiovascular complication will be avoided Outcome: Progressing   Problem: Activity: Goal: Risk for activity intolerance will decrease Outcome: Progressing   Problem: Nutrition: Goal: Adequate nutrition will be maintained Outcome: Progressing   Problem: Coping: Goal: Level of anxiety will decrease Outcome: Progressing   Problem: Elimination: Goal: Will not experience complications related to bowel motility Outcome: Progressing Goal: Will not experience complications related to urinary retention Outcome: Progressing   Problem: Pain Managment: Goal: General experience of comfort will improve Outcome: Progressing   Problem: Safety: Goal: Ability to remain free from injury will improve Outcome: Progressing   Problem: Skin Integrity: Goal: Risk for impaired skin integrity will decrease Outcome: Progressing   Problem: Education: Goal: Understanding of CV disease, CV risk reduction, and recovery process will improve Outcome: Progressing Goal: Individualized Educational Video(s) Outcome: Progressing   Problem: Activity: Goal: Ability to return to baseline activity level will improve Outcome: Progressing   Problem: Cardiovascular: Goal: Ability to achieve and maintain adequate cardiovascular perfusion  will improve Outcome: Progressing Goal: Vascular access site(s) Level 0-1 will be maintained Outcome: Progressing   Problem: Health Behavior/Discharge Planning: Goal: Ability to safely manage health-related needs after discharge will improve Outcome: Progressing   Problem: Education: Goal: Ability to demonstrate management of disease process will improve Outcome: Progressing Goal: Ability to verbalize understanding of medication therapies will improve Outcome: Progressing Goal: Individualized Educational Video(s) Outcome: Progressing   Problem: Activity: Goal: Capacity to carry out activities will improve Outcome: Progressing   Problem: Cardiac: Goal: Ability to achieve and maintain adequate cardiopulmonary perfusion will improve Outcome: Progressing   

## 2023-01-16 NOTE — Discharge Summary (Signed)
Physician Discharge Summary  [name]. ZOX:[id number] DOB: September 15, 1965 DOA: 01/12/2023  PCP: Dione Housekeeper, MD  Admit date: 01/12/2023 Discharge date: 01/16/2023  Admitted From: Home Disposition:  Home  Recommendations for Outpatient Follow-up:  Follow up with PCP in 1-2 weeks Follow up with cardiology 1 week  Home Health:No  Equipment/Devices:None   Discharge Condition:Stable  CODE STATUS:FULL  Diet recommendation: Heart healthy  Brief/Interim Summary:   57 year old male with history of hypertension, atrial fibrillation was on flecainide, who presents to the emergency department for chief concerns of chest pressure, vomiting, abdominal distention.  High sensitive troponin is greater than 24,000 EKG with inferolateral STEMI.  STEMI cardiologist called to bedside.  Patient taken emergently to Cath Lab for PCI.  Status post PCI with DES to mid RCA.  Admitted to ICU on tirofiban infusion post procedurally.  Required vasopressor support for short period of time.   8/10: Doing well overall.  In rapid atrial fibrillation.  Reports of intermittent chest tightness.  Vital signs stable.  Wife at bedside.   8/11: Remains in rapid atrial fibrillation.  Complains of cough 8/12: Remains in atrial fibrillation.  Rate control improved.  Had some episodes of chest pain overnight. 8/13: Remains in fib.  Rate improved.  Unclear etiology of low grade temp and elevated procal.  No evidence of pneumonia, UTI.  Blood cultures negative.  Treat empirically with 5 day course of abx   Discharge Diagnoses:  Principal Problem:   STEMI involving right coronary artery (HCC) Active Problems:   Anxiety   Breathlessness on exertion   Adiposity   Paroxysmal atrial fibrillation (HCC)   ST elevation myocardial infarction involving right coronary artery (HCC)   AKI (acute kidney injury) (HCC)   CAD S/P percutaneous coronary angioplasty    STEMI involving right coronary artery (HCC) Status post  left heart cath with PCI to RCA Per cardiology recommendation, continue to tirofiban infusion for 18 hours followed by heparin infusion Complete echo ordered Plan: Discharge home Per cardiology recommendations aspirin Plavix and apixaban x 1 week followed by apixaban and clopidogrel for 12 months Cardiology follow-up Metop 50 BID.  BP cannot tolerate addition of ACEi at this time   CAD S/P percutaneous coronary angioplasty Recommended apixaban and clopidogrel for 12 months post discharge Aggressive secondary coronary artery disease prevention   AKI (acute kidney injury) (HCC) Suspect secondary to cardiorenal Kidney function returned to baseline   Paroxysmal atrial fibrillation (HCC) Patient remains on amiodarone gtt.  Switch to p.o. Eliquis.  Cannot use flecainide given ischemic disease Plan: P.o. amiodarone now per cardiology Metoprolol 50 mg every 12 hours     Dyspnea on exertion Secondary to STEMI Lasix 20mg  every day prescribed    Discharge Instructions  Discharge Instructions     AMB Referral to Cardiac Rehabilitation - Phase II   Complete by: As directed    Diagnosis:  Coronary Stents STEMI     After initial evaluation and assessments completed: Virtual Based Care may be provided alone or in conjunction with Phase 2 Cardiac Rehab based on patient barriers.: Yes   Intensive Cardiac Rehabilitation (ICR) [facility] location only OR Traditional Cardiac Rehabilitation (TCR) *If criteria for ICR are not met will enroll in TCR [facility] only): Yes   Diet - low sodium heart healthy   Complete by: As directed    Increase activity slowly   Complete by: As directed       Allergies as of 01/16/2023       Reactions  Azithromycin Other (See Comments)   Drug interacts with current daily medication.        Medication List     STOP taking these medications    albuterol 108 (90 Base) MCG/ACT inhaler Commonly known as: VENTOLIN HFA   colchicine 0.6 MG tablet   flecainide 100  MG tablet Commonly known as: TAMBOCOR   fluticasone 50 MCG/ACT nasal spray Commonly known as: FLONASE       TAKE these medications    amiodarone 200 MG tablet Commonly known as: PACERONE Take 1 tablet (200 mg total) by mouth 2 (two) times daily for 9 days, THEN 1 tablet (200 mg total) daily. Start taking on: January 16, 2023   amoxicillin-clavulanate 875-125 MG tablet Commonly known as: AUGMENTIN Take 1 tablet by mouth 2 (two) times daily for 5 days. Start taking on: January 17, 2023   apixaban 5 MG Tabs tablet Commonly known as: ELIQUIS Take 1 tablet (5 mg total) by mouth 2 (two) times daily.   aspirin 81 MG chewable tablet Chew 1 tablet (81 mg total) by mouth daily for 14 days. Start taking on: January 17, 2023   atorvastatin 40 MG tablet Commonly known as: LIPITOR Take 1 tablet (40 mg total) by mouth daily. Start taking on: January 17, 2023   benzonatate 200 MG capsule Commonly known as: TESSALON Take 1 capsule (200 mg total) by mouth 3 (three) times daily as needed for cough. What changed: when to take this   clopidogrel 75 MG tablet Commonly known as: PLAVIX Take 1 tablet (75 mg total) by mouth daily. Start taking on: January 17, 2023   furosemide 20 MG tablet Commonly known as: LASIX Take 1 tablet (20 mg total) by mouth daily. Start taking on: January 17, 2023   meloxicam 7.5 MG tablet Commonly known as: MOBIC Take 1 tablet by mouth daily.   metoprolol tartrate 50 MG tablet Commonly known as: LOPRESSOR Take 1 tablet (50 mg total) by mouth 2 (two) times daily. What changed:  medication strength how much to take when to take this   simethicone 80 MG chewable tablet Commonly known as: MYLICON Chew 1 tablet (80 mg total) by mouth 4 (four) times daily.   Spacer/Aero Chamber Mouthpiece Misc One spacer to use with albuterol inhaler.        Follow-up Information     Alwyn Pea, MD. Go in 1 week(s).   Specialties: Cardiology, Internal  Medicine Contact information: [address] [place] [address] [phone]                Allergies  Allergen Reactions   Azithromycin Other (See Comments)    Drug interacts with current daily medication.    Consultations: Cardiology- [facility]   Procedures/Studies: DG Abd 1 View  Result Date: 01/16/2023 CLINICAL DATA:  Constipation EXAM: ABDOMEN - 1 VIEW COMPARISON:  None Available. FINDINGS: Nonobstructive pattern of bowel gas with gas present to the distal sigmoid colon. Scattered stool without large burden. No obvious free air. Phleboliths in the low pelvis. IMPRESSION: Nonobstructive pattern of bowel gas with gas present to the distal sigmoid colon. Scattered stool without large burden. Electronically Signed   By: Jearld Lesch M.D.   On: 01/16/2023 14:08   DG Chest Port 1 View  Result Date: 01/15/2023 CLINICAL DATA:  Shortness of breath. EXAM: PORTABLE CHEST 1 VIEW COMPARISON:  01/13/2023 FINDINGS: The cardio pericardial silhouette is enlarged. Vascular congestion with diffuse interstitial opacity suggests edema. No pneumothorax. No focal consolidation or substantial  pleural effusion. No acute bony abnormality. IMPRESSION: Enlargement of the cardiopericardial silhouette with vascular congestion and diffuse interstitial opacity suggesting edema. Electronically Signed   By: Kennith Center M.D.   On: 01/15/2023 05:48   DG Chest Port 1 View  Result Date: 01/13/2023 CLINICAL DATA:  57 year old male with shortness of breath. EXAM: PORTABLE CHEST 1 VIEW COMPARISON:  Portable chest 01/12/2023 and earlier. FINDINGS: Portable AP upright view at 1122 hours. Chronically low lung volumes. Mediastinal contours remain normal. Visualized tracheal air column is within normal limits. Allowing for portable technique the lungs are clear. No pneumothorax or pleural effusion. No acute osseous abnormality identified. Negative visible bowel gas. IMPRESSION: No acute cardiopulmonary abnormality.  Electronically Signed   By: Odessa Fleming M.D.   On: 01/13/2023 11:28   ECHOCARDIOGRAM COMPLETE  Result Date: 01/12/2023    ECHOCARDIOGRAM REPORT   Patient Name:   Keith Torres. Date of Exam: 01/12/2023 Medical Rec #:  [id number]            Height:       74.0 in Accession #:    [id number]           Weight:       252.2 lb Date of Birth:  Aug 21, 1965            BSA:          2.399 m Patient Age:    57 years             BP:           111/54 mmHg Patient Gender: M                    HR:           95 bpm. Exam Location:  [facility] Procedure: 2D Echo, Cardiac Doppler and Color Doppler Indications:     Acute myocardial Infarction --unspecified I21.9  History:         Patient has no prior history of Echocardiogram examinations.                  Arrythmias:Atrial Fibrillation; Risk Factors:Dyslipidemia and                  Sleep Apnea.  Sonographer:     Cristela Blue Referring Phys:  Debbe Odea MD Diagnosing Phys: Debbe Odea MD  Sonographer Comments: Technically challenging study due to limited acoustic windows and no parasternal window. IMPRESSIONS  1. Left ventricular ejection fraction, by estimation, is 50 to 55%. Left ventricular ejection fraction by PLAX is 51 %. The left ventricle has low normal function. The left ventricle has no regional wall motion abnormalities. There is mild left ventricular hypertrophy. Left ventricular diastolic parameters are indeterminate.  2. Right ventricular systolic function is low normal. The right ventricular size is mildly enlarged. There is normal pulmonary artery systolic pressure.  3. The mitral valve is normal in structure. Mild mitral valve regurgitation.  4. The aortic valve was not well visualized. Aortic valve regurgitation is trivial.  5. The inferior vena cava is dilated in size with >50% respiratory variability, suggesting right atrial pressure of 8 mmHg. FINDINGS  Left Ventricle: Left ventricular ejection fraction, by estimation, is 50 to 55%. Left ventricular ejection  fraction by PLAX is 51 %. The left ventricle has low normal function. The left ventricle has no regional wall motion abnormalities. The left ventricular internal cavity size was normal in size. There is mild left ventricular hypertrophy. Left ventricular diastolic  parameters are indeterminate. Right Ventricle: The right ventricular size is mildly enlarged. No increase in right ventricular wall thickness. Right ventricular systolic function is low normal. There is normal pulmonary artery systolic pressure. The tricuspid regurgitant velocity is 2.12 m/s, and with an assumed right atrial pressure of 8 mmHg, the estimated right ventricular systolic pressure is 26.0 mmHg. Left Atrium: Left atrial size was normal in size. Right Atrium: Right atrial size was normal in size. Pericardium: There is no evidence of pericardial effusion. Mitral Valve: The mitral valve is normal in structure. Mild mitral valve regurgitation. Tricuspid Valve: The tricuspid valve is normal in structure. Tricuspid valve regurgitation is not demonstrated. Aortic Valve: The aortic valve was not well visualized. Aortic valve regurgitation is trivial. Aortic valve mean gradient measures 4.0 mmHg. Aortic valve peak gradient measures 7.5 mmHg. Aortic valve area, by VTI measures 2.58 cm. Pulmonic Valve: The pulmonic valve was not well visualized. Pulmonic valve regurgitation is not visualized. Aorta: The aortic root is normal in size and structure. Venous: The inferior vena cava is dilated in size with greater than 50% respiratory variability, suggesting right atrial pressure of 8 mmHg. IAS/Shunts: No atrial level shunt detected by color flow Doppler.  LEFT VENTRICLE PLAX 2D LV EF:         Left ventricular ejection fraction by PLAX is 51 %. LVIDd:         3.90 cm LVIDs:         2.90 cm LV PW:         0.90 cm LV IVS:        1.40 cm LVOT diam:     2.20 cm LV SV:         46 LV SV Index:   19 LVOT Area:     3.80 cm  RIGHT VENTRICLE RV Basal diam:  4.40 cm RV  Mid diam:    2.70 cm RV S prime:     11.50 cm/s TAPSE (M-mode): 1.9 cm LEFT ATRIUM             Index        RIGHT ATRIUM           Index LA diam:        3.00 cm 1.25 cm/m   RA Area:     18.70 cm LA Vol (A2C):   44.9 ml 18.72 ml/m  RA Volume:   52.60 ml  21.93 ml/m LA Vol (A4C):   60.8 ml 25.35 ml/m LA Biplane Vol: 57.3 ml 23.89 ml/m  AORTIC VALVE AV Area (Vmax):    2.46 cm AV Area (Vmean):   2.47 cm AV Area (VTI):     2.58 cm AV Vmax:           137.00 cm/s AV Vmean:          91.200 cm/s AV VTI:            0.178 m AV Peak Grad:      7.5 mmHg AV Mean Grad:      4.0 mmHg LVOT Vmax:         88.60 cm/s LVOT Vmean:        59.200 cm/s LVOT VTI:          0.121 m LVOT/AV VTI ratio: 0.68  AORTA Ao Root diam: 2.80 cm MITRAL VALVE               TRICUSPID VALVE MV Area (PHT): 3.30 cm    TR Peak grad:   18.0  mmHg MV Decel Time: 230 msec    TR Vmax:        212.00 cm/s MV E velocity: 78.60 cm/s                            SHUNTS                            Systemic VTI:  0.12 m                            Systemic Diam: 2.20 cm Debbe Odea MD Electronically signed by Debbe Odea MD Signature Date/Time: 01/12/2023/3:59:22 PM    Final    CARDIAC CATHETERIZATION  Result Date: 01/12/2023 Conclusions: Severe single-vessel coronary artery disease with thrombotic occlusion of mid RCA involving acute marginal branch as well as embolized thrombus in the RPDA.  There is also moderate, nonobstructive mid LAD disease of up to 50%. Moderately reduced left ventricular systolic function (LVEF 35-45%) with inferior akinesis. Mildly elevated left ventricular filling pressure (LVEDP 20 mmHg). Challenging but successful PCI to occluded mid RCA using Onyx frontier 3.0 x 18 mm drug-eluting stent with 0% residual stenosis and TIMI-3 flow.  Embolized thrombus in the PDA improved with angioplasty, though the distal vessel remained occluded at the end of the case.  Difficulty crossing the mid RCA occlusion consistent with organizing  thrombus in the setting of late presenting STEMI. Recommendations: Wean off norepinephrine as tolerated.  Suspect acute RV infarction from RCA/acute marginal disease may be contributing to hypotension. Continue tirofiban infusion for 18 hours.  Recommend starting heparin infusion tomorrow after discontinuation of tirofiban. Given atrial fibrillation and CHA2DS2-VASc score of at least 2 (coronary artery disease and cardiomyopathy), the patient would benefit from long-term anticoagulation.  Favor transitioning from ticagrelor to clopidogrel prior to discharge, treating with aspirin, clopidogrel, and apixaban for 1 week, and subsequently maintaining apixaban and clopidogrel for 12 months. Aggressive secondary prevention of coronary artery disease. Follow-up echocardiogram. Yvonne Kendall, MD [facility]  DG Chest Port 1 View  Result Date: 01/12/2023 CLINICAL DATA:  Chest pain. EXAM: PORTABLE CHEST 1 VIEW COMPARISON:  X-ray 05/30/2018 FINDINGS: No consolidation, pneumothorax or effusion. Normal cardiopericardial silhouette without edema. Overlapping cardiac leads and defibrillator pads. Old right-sided rib fracture. IMPRESSION: No acute cardiopulmonary disease. Electronically Signed   By: Karen Kays M.D.   On: 01/12/2023 11:17      Subjective: Seen and examined on day of dc.  Stable, no distress.  Appropriate for dc home.  Discharge Exam: Vitals:   01/16/23 0447 01/16/23 1139  BP: 96/66 115/83  Pulse: 95 100  Resp: 20 (!) 21  Temp: 99.4 F (37.4 C) 98.3 F (36.8 C)  SpO2: 98% 98%   Vitals:   01/16/23 0044 01/16/23 0245 01/16/23 0447 01/16/23 1139  BP: (!) 87/56 96/62 96/66  115/83  Pulse: (!) 103  95 100  Resp: 20 16 20  (!) 21  Temp: 98.8 F (37.1 C) 99 F (37.2 C) 99.4 F (37.4 C) 98.3 F (36.8 C)  TempSrc:      SpO2: 99% 99% 98% 98%  Weight:      Height:        General: Pt is alert, awake, not in acute distress Cardiovascular: RRR, S1/S2 +, no rubs, no gallops Respiratory:  CTA bilaterally, no wheezing, no rhonchi Abdominal: Soft, NT, ND, bowel sounds + Extremities: no edema,  no cyanosis    The results of significant diagnostics from this hospitalization (including imaging, microbiology, ancillary and laboratory) are listed below for reference.     Microbiology: Recent Results (from the past 240 hour(s))  MRSA Next Gen by PCR, Nasal     Status: None   Collection Time: 01/12/23 12:37 PM   Specimen: Nasal Mucosa; Nasal Swab  Result Value Ref Range Status   MRSA by PCR Next Gen NOT DETECTED NOT DETECTED Final    Comment: (NOTE) The GeneXpert MRSA Assay (FDA approved for NASAL specimens only), is one component of a comprehensive MRSA colonization surveillance program. It is not intended to diagnose MRSA infection nor to guide or monitor treatment for MRSA infections. Test performance is not FDA approved in patients less than 80 years old. Performed at [facility], [address] [place], [place] [address]   Culture, blood (Routine X 2) w Reflex to ID Panel     Status: None (Preliminary result)   Collection Time: 01/15/23  7:12 PM   Specimen: BLOOD  Result Value Ref Range Status   Specimen Description BLOOD BLOOD LEFT HAND  Final   Special Requests   Final    BOTTLES DRAWN AEROBIC AND ANAEROBIC Blood Culture adequate volume   Culture   Final    NO GROWTH < 12 HOURS Performed at [facility], [address], [place], [place] [address]    Report Status PENDING  Incomplete  Culture, blood (Routine X 2) w Reflex to ID Panel     Status: None (Preliminary result)   Collection Time: 01/15/23  7:19 PM   Specimen: BLOOD  Result Value Ref Range Status   Specimen Description BLOOD RIGHT ANTECUBITAL  Final   Special Requests   Final    BOTTLES DRAWN AEROBIC AND ANAEROBIC Blood Culture results may not be optimal due to an excessive volume of blood received in culture bottles   Culture   Final    NO GROWTH < 12 HOURS Performed at  [facility], [address], [place], [place] [address]    Report Status PENDING  Incomplete  SARS Coronavirus 2 by RT PCR (hospital order, performed in [facility] hospital lab) *cepheid single result test* Anterior Nasal Swab     Status: None   Collection Time: 01/15/23 10:50 PM   Specimen: Anterior Nasal Swab  Result Value Ref Range Status   SARS Coronavirus 2 by RT PCR NEGATIVE NEGATIVE Final    Comment: (NOTE) SARS-CoV-2 target nucleic acids are NOT DETECTED.  The SARS-CoV-2 RNA is generally detectable in upper and lower respiratory specimens during the acute phase of infection. The lowest concentration of SARS-CoV-2 viral copies this assay can detect is 250 copies / mL. A negative result does not preclude SARS-CoV-2 infection and should not be used as the sole basis for treatment or other patient management decisions.  A negative result may occur with improper specimen collection / handling, submission of specimen other than nasopharyngeal swab, presence of viral mutation(s) within the areas targeted by this assay, and inadequate number of viral copies (<250 copies / mL). A negative result must be combined with clinical observations, patient history, and epidemiological information.  Fact Sheet for Patients:   RoadLapTop.co.za  Fact Sheet for Healthcare Providers: http://kim-miller.com/  This test is not yet approved or  cleared by the [place] FDA and has been authorized for detection and/or diagnosis of SARS-CoV-2 by FDA under an Emergency Use Authorization (EUA).  This EUA will remain in  effect (meaning this test can be used) for the duration of the COVID-19 declaration under Section 564(b)(1) of the Act, 21 U.S.C. section 360bbb-3(b)(1), unless the authorization is terminated or revoked sooner.  Performed at [facility], [address] [place], [place] [address]      Labs: BNP (last 3  results) No results for input(s): "BNP" in the last 8760 hours. Basic Metabolic Panel: Recent Labs  Lab 01/12/23 1008 01/13/23 0421 01/14/23 0529 01/15/23 0503 01/16/23 0855  NA 135 132* 130* 134* 134*  K 4.0 4.0 3.5 3.9 3.1*  CL 99 97* 96* 97* 97*  CO2 24 26 24 28 27   GLUCOSE 182* 173* 142* 122* 149*  BUN 18 19 31* 35* 31*  CREATININE 1.29* 1.12 1.29* 1.38* 1.27*  CALCIUM 8.9 8.5* 7.9* 8.0* 8.2*  MG  --   --   --   --  2.9*   Liver Function Tests: No results for input(s): "AST", "ALT", "ALKPHOS", "BILITOT", "PROT", "ALBUMIN" in the last 168 hours. Recent Labs  Lab 01/12/23 1008  LIPASE 27   No results for input(s): "AMMONIA" in the last 168 hours. CBC: Recent Labs  Lab 01/12/23 1008 01/13/23 0421 01/14/23 0529 01/15/23 0503 01/16/23 0855  WBC 14.6* 11.3* 10.3 7.1 6.7  HGB 15.4 14.2 12.4* 12.0* 12.9*  HCT 42.8 39.8 34.6* 33.9* 35.6*  MCV 87.2 89.6 88.7 89.9 87.0  PLT 195 172 154 185 251   Cardiac Enzymes: No results for input(s): "CKTOTAL", "CKMB", "CKMBINDEX", "TROPONINI" in the last 168 hours. BNP: Invalid input(s): "POCBNP" CBG: Recent Labs  Lab 01/12/23 1236 01/15/23 2047  GLUCAP 166* 153*   D-Dimer No results for input(s): "DDIMER" in the last 72 hours. Hgb A1c No results for input(s): "HGBA1C" in the last 72 hours. Lipid Profile No results for input(s): "CHOL", "HDL", "LDLCALC", "TRIG", "CHOLHDL", "LDLDIRECT" in the last 72 hours. Thyroid function studies No results for input(s): "TSH", "T4TOTAL", "T3FREE", "THYROIDAB" in the last 72 hours.  Invalid input(s): "FREET3" Anemia work up No results for input(s): "VITAMINB12", "FOLATE", "FERRITIN", "TIBC", "IRON", "RETICCTPCT" in the last 72 hours. Urinalysis    Component Value Date/Time   COLORURINE YELLOW (A) 01/15/2023 2250   APPEARANCEUR CLEAR (A) 01/15/2023 2250   LABSPEC 1.020 01/15/2023 2250   PHURINE 5.0 01/15/2023 2250   GLUCOSEU NEGATIVE 01/15/2023 2250   HGBUR NEGATIVE 01/15/2023  2250   BILIRUBINUR NEGATIVE 01/15/2023 2250   KETONESUR NEGATIVE 01/15/2023 2250   PROTEINUR NEGATIVE 01/15/2023 2250   NITRITE NEGATIVE 01/15/2023 2250   LEUKOCYTESUR NEGATIVE 01/15/2023 2250   Sepsis Labs Recent Labs  Lab 01/13/23 0421 01/14/23 0529 01/15/23 0503 01/16/23 0855  WBC 11.3* 10.3 7.1 6.7   Microbiology Recent Results (from the past 240 hour(s))  MRSA Next Gen by PCR, Nasal     Status: None   Collection Time: 01/12/23 12:37 PM   Specimen: Nasal Mucosa; Nasal Swab  Result Value Ref Range Status   MRSA by PCR Next Gen NOT DETECTED NOT DETECTED Final    Comment: (NOTE) The GeneXpert MRSA Assay (FDA approved for NASAL specimens only), is one component of a comprehensive MRSA colonization surveillance program. It is not intended to diagnose MRSA infection nor to guide or monitor treatment for MRSA infections. Test performance is not FDA approved in patients less than 63 years old. Performed at [facility], [address] [place], [place] [address]   Culture, blood (Routine X 2) w Reflex to ID Panel     Status: None (Preliminary result)  Collection Time: 01/15/23  7:12 PM   Specimen: BLOOD  Result Value Ref Range Status   Specimen Description BLOOD BLOOD LEFT HAND  Final   Special Requests   Final    BOTTLES DRAWN AEROBIC AND ANAEROBIC Blood Culture adequate volume   Culture   Final    NO GROWTH < 12 HOURS Performed at [facility], [address] [place], [place] [address]    Report Status PENDING  Incomplete  Culture, blood (Routine X 2) w Reflex to ID Panel     Status: None (Preliminary result)   Collection Time: 01/15/23  7:19 PM   Specimen: BLOOD  Result Value Ref Range Status   Specimen Description BLOOD RIGHT ANTECUBITAL  Final   Special Requests   Final    BOTTLES DRAWN AEROBIC AND ANAEROBIC Blood Culture results may not be optimal due to an excessive volume of blood received in culture bottles   Culture   Final    NO GROWTH  < 12 HOURS Performed at [facility], [address], [place], [place] [address]    Report Status PENDING  Incomplete  SARS Coronavirus 2 by RT PCR (hospital order, performed in [facility] hospital lab) *cepheid single result test* Anterior Nasal Swab     Status: None   Collection Time: 01/15/23 10:50 PM   Specimen: Anterior Nasal Swab  Result Value Ref Range Status   SARS Coronavirus 2 by RT PCR NEGATIVE NEGATIVE Final    Comment: (NOTE) SARS-CoV-2 target nucleic acids are NOT DETECTED.  The SARS-CoV-2 RNA is generally detectable in upper and lower respiratory specimens during the acute phase of infection. The lowest concentration of SARS-CoV-2 viral copies this assay can detect is 250 copies / mL. A negative result does not preclude SARS-CoV-2 infection and should not be used as the sole basis for treatment or other patient management decisions.  A negative result may occur with improper specimen collection / handling, submission of specimen other than nasopharyngeal swab, presence of viral mutation(s) within the areas targeted by this assay, and inadequate number of viral copies (<250 copies / mL). A negative result must be combined with clinical observations, patient history, and epidemiological information.  Fact Sheet for Patients:   RoadLapTop.co.za  Fact Sheet for Healthcare Providers: http://kim-miller.com/  This test is not yet approved or  cleared by the [place] FDA and has been authorized for detection and/or diagnosis of SARS-CoV-2 by FDA under an Emergency Use Authorization (EUA).  This EUA will remain in effect (meaning this test can be used) for the duration of the COVID-19 declaration under Section 564(b)(1) of the Act, 21 U.S.C. section 360bbb-3(b)(1), unless the authorization is terminated or revoked sooner.  Performed at [facility], [address], [place], [place] [address]       Time coordinating discharge: Over 30 minutes  SIGNED:   Tresa Moore, MD  [facility] 01/16/2023, 4:48 PM Pager   If 7PM-7AM, please contact night-coverage

## 2023-01-16 NOTE — Progress Notes (Signed)
[facility] CLINIC CARDIOLOGY CONSULT NOTE       Patient ID: Keith Torres. MRN: [id number] DOB/AGE: 1966/02/18 57 y.o.  Admit date: 01/12/2023 Referring Physician Dr. Okey Dupre Primary Physician Dr. Rolin Barry  Primary Cardiologist previous Dr. Gwen Pounds  Reason for Consultation inferior STEMI  HPI: Keith Torres. Keith Torres. Is a 57yoM with a PMH of paroxysmal AF, HTN, OSA on CPAP, HLD who presented to [facility] ED 01/12/2023 with with on and off chest tightness 5 days prior to presentation, which became more severe and persistent with radiation to his shoulders and associated with shortness of breath.  Initial EKG showed inferolateral ST elevations and reciprocal depressions, taken for emergent LHC by Dr. Okey Dupre with successful PCI with DES to mid RCA, and improvement in the embolized thrombus to the PDA.  He required NE for blood pressure support following the PCI which has since been weaned off.  Hospital course also complicated by AF RVR, rate and rhythm controlled with metoprolol and amiodarone.  Echo this admission fortunately shows preserved LVEF 50-55% without RWMA's, mild MR, trivial AR.  Interval history: -overnight events noted, tmax 100.5, no leukocytosis with WBCs 6.7. urine ok. Blood cx pending -feels overall better today, no chest pain or significant dyspnea. No heart racing or palpitations. Remains constipated (day 5 no BM), which is his biggest complaint -remains in AF on tele, rate low 110s predominantly   Review of systems complete and found to be negative unless listed above     Past Medical History:  Diagnosis Date   Anxiety    Atrial fibrillation (HCC)    Dental crowns present    FRONT TWO TEETH   Deviated septum    Dysrhythmia    A-FIB/ DR Gwen Pounds   Hyperlipidemia    Hypertrophy of nasal turbinates    Meningitis    Obesity    Rhinitis, allergic    Seasonal allergies    Sleep apnea    C-PAP    Past Surgical History:  Procedure Laterality Date   APPENDECTOMY   06/05/1972   BACK SURGERY  06/05/2013   COLONOSCOPY     COLONOSCOPY WITH PROPOFOL N/A 10/08/2015   Procedure: COLONOSCOPY WITH PROPOFOL;  Surgeon: Scot Jun, MD;  Location: [facility] ENDOSCOPY;  Service: Endoscopy;  Laterality: N/A;   CORONARY ANGIOPLASTY WITH STENT PLACEMENT     CORONARY/GRAFT ACUTE MI REVASCULARIZATION N/A 01/12/2023   Procedure: Coronary/Graft Acute MI Revascularization;  Surgeon: Yvonne Kendall, MD;  Location: [facility] INVASIVE CV LAB;  Service: Cardiovascular;  Laterality: N/A;   HERNIA REPAIR Right 01/17/2001   Indirect hernia, Prolene hernia system.   HERNIA REPAIR Left 03/23/2014   Left indirect inguinal hernia repair, large ultra Pro mesh   LEFT HEART CATH AND CORONARY ANGIOGRAPHY N/A 01/12/2023   Procedure: LEFT HEART CATH AND CORONARY ANGIOGRAPHY;  Surgeon: Yvonne Kendall, MD;  Location: [facility] INVASIVE CV LAB;  Service: Cardiovascular;  Laterality: N/A;   NASAL TURBINATE REDUCTION Bilateral 05/21/2015   Procedure: TURBINATE REDUCTION/SUBMUCOSAL RESECTION;  Surgeon: Linus Salmons, MD;  Location: [facility] SURGERY [facility];  Service: ENT;  Laterality: Bilateral;   SEPTOPLASTY N/A 05/21/2015   Procedure: SEPTOPLASTY;  Surgeon: Linus Salmons, MD;  Location: [facility]ERY [facility];  Service: ENT;  Laterality: N/A;  C-PAP    Medications Prior to Admission  Medication Sig Dispense Refill Last Dose   flecainide (TAMBOCOR) 100 MG tablet Take 100 mg by mouth 2 (two) times daily. AM AND PM   01/11/2023   meloxicam (MOBIC) 7.5 MG tablet Take 1 tablet by  mouth daily.   01/11/2023   albuterol (PROVENTIL HFA;VENTOLIN HFA) 108 (90 Base) MCG/ACT inhaler  (Patient not taking: Reported on 01/12/2023)   Not Taking   benzonatate (TESSALON) 200 MG capsule Take 1 capsule (200 mg total) by mouth at bedtime as needed for cough. (Patient not taking: Reported on 01/12/2023) 20 capsule 0 Not Taking   colchicine 0.6 MG tablet Take 2 tablets (1.2 mg) at onset of gout flare. Take 1 additional tablet (0.6 mg)  one hour later if symptoms persist. Maximum dose is 1.8 mg (3 tablets) in 24 hours. Then take one tablet (0.6 mg) daily until symptoms resolve. (Patient not taking: Reported on 01/12/2023)   Not Taking   fluticasone (FLONASE) 50 MCG/ACT nasal spray Place 2 sprays into both nostrils daily. (Patient not taking: Reported on 01/12/2023) 16 g 1 Not Taking   metoprolol tartrate (LOPRESSOR) 25 MG tablet Take by mouth.      Spacer/Aero Chamber Mouthpiece MISC One spacer to use with albuterol inhaler. 1 each 0    Social History   Socioeconomic History   Marital status: Married    Spouse name: Not on file   Number of children: Not on file   Years of education: Not on file   Highest education level: Not on file  Occupational History   Not on file  Tobacco Use   Smoking status: Never   Smokeless tobacco: Former    Types: Chew    Quit date: 1999  Substance and Sexual Activity   Alcohol use: Not Currently    Comment: occasionally   Drug use: Never   Sexual activity: Yes    Partners: Female  Other Topics Concern   Not on file  Social History Narrative   Not on file   Social Determinants of Health   Financial Resource Strain: Not on file  Food Insecurity: No Food Insecurity (01/12/2023)   Hunger Vital Sign    Worried About Running Out of Food in the Last Year: Never true    Ran Out of Food in the Last Year: Never true  Transportation Needs: No Transportation Needs (01/12/2023)   PRAPARE - Administrator, Civil Service (Medical): No    Lack of Transportation (Non-Medical): No  Physical Activity: Not on file  Stress: Not on file  Social Connections: Not on file  Intimate Partner Violence: Not At Risk (01/12/2023)   Humiliation, Afraid, Rape, and Kick questionnaire    Fear of Current or Ex-Partner: No    Emotionally Abused: No    Physically Abused: No    Sexually Abused: No    Family History  Problem Relation Age of Onset   Cancer Mother        colon   Heart disease Father     Hyperlipidemia Father    CAD Father       Intake/Output Summary (Last 24 hours) at 01/16/2023 1009 Last data filed at 01/16/2023 0500 Gross per 24 hour  Intake 200 ml  Output 300 ml  Net -100 ml    Vitals:   01/15/23 2048 01/16/23 0044 01/16/23 0245 01/16/23 0447  BP: (!) 107/59 (!) 87/56 96/62 96/66   Pulse: 90 (!) 103  95  Resp: 20 20 16 20   Temp: 98.2 F (36.8 C) 98.8 F (37.1 C) 99 F (37.2 C) 99.4 F (37.4 C)  TempSrc:      SpO2: 100% 99% 99% 98%  Weight:      Height:        PHYSICAL EXAM  General: Pleasant conversational middle-age male, well nourished, in no acute distress.  Sitting upright in bed with wife at bedside. HEENT:  Normocephalic and atraumatic. Neck:  No JVD.  Lungs: Normal respiratory effort on room air. Clear bilaterally to auscultation. No wheezes, crackles, rhonchi.  Heart: tachy Irregularly irregular Normal S1 and S2 without gallops or murmurs.  Abdomen: Non-distended appearing.  Msk: Normal strength and tone for age. Extremities: Warm and well perfused. No clubbing, cyanosis.  No peripheral edema.  Neuro: Alert and oriented X 3. Psych:  Answers questions appropriately.   Labs: Basic Metabolic Panel: Recent Labs    01/14/23 0529 01/15/23 0503  NA 130* 134*  K 3.5 3.9  CL 96* 97*  CO2 24 28  GLUCOSE 142* 122*  BUN 31* 35*  CREATININE 1.29* 1.38*  CALCIUM 7.9* 8.0*   Liver Function Tests: No results for input(s): "AST", "ALT", "ALKPHOS", "BILITOT", "PROT", "ALBUMIN" in the last 72 hours. No results for input(s): "LIPASE", "AMYLASE" in the last 72 hours. CBC: Recent Labs    01/15/23 0503 01/16/23 0855  WBC 7.1 6.7  HGB 12.0* 12.9*  HCT 33.9* 35.6*  MCV 89.9 87.0  PLT 185 251   Cardiac Enzymes: Recent Labs    01/15/23 0503  TROPONINIHS 10,796*   BNP: No results for input(s): "BNP" in the last 72 hours. D-Dimer: No results for input(s): "DDIMER" in the last 72 hours. Hemoglobin A1C: No results for input(s): "HGBA1C" in  the last 72 hours.  Fasting Lipid Panel: No results for input(s): "CHOL", "HDL", "LDLCALC", "TRIG", "CHOLHDL", "LDLDIRECT" in the last 72 hours.  Thyroid Function Tests: No results for input(s): "TSH", "T4TOTAL", "T3FREE", "THYROIDAB" in the last 72 hours.  Invalid input(s): "FREET3" Anemia Panel: No results for input(s): "VITAMINB12", "FOLATE", "FERRITIN", "TIBC", "IRON", "RETICCTPCT" in the last 72 hours.   Radiology: [facility] Chest Port 1 View  Result Date: 01/15/2023 CLINICAL DATA:  Shortness of breath. EXAM: PORTABLE CHEST 1 VIEW COMPARISON:  01/13/2023 FINDINGS: The cardio pericardial silhouette is enlarged. Vascular congestion with diffuse interstitial opacity suggests edema. No pneumothorax. No focal consolidation or substantial pleural effusion. No acute bony abnormality. IMPRESSION: Enlargement of the cardiopericardial silhouette with vascular congestion and diffuse interstitial opacity suggesting edema. Electronically Signed   By: Kennith Center M.D.   On: 01/15/2023 05:48   DG Chest Port 1 View  Result Date: 01/13/2023 CLINICAL DATA:  57 year old male with shortness of breath. EXAM: PORTABLE CHEST 1 VIEW COMPARISON:  Portable chest 01/12/2023 and earlier. FINDINGS: Portable AP upright view at 1122 hours. Chronically low lung volumes. Mediastinal contours remain normal. Visualized tracheal air column is within normal limits. Allowing for portable technique the lungs are clear. No pneumothorax or pleural effusion. No acute osseous abnormality identified. Negative visible bowel gas. IMPRESSION: No acute cardiopulmonary abnormality. Electronically Signed   By: Odessa Fleming M.D.   On: 01/13/2023 11:28   ECHOCARDIOGRAM COMPLETE  Result Date: 01/12/2023    ECHOCARDIOGRAM REPORT   Patient Name:   Keith Torres. Date of Exam: 01/12/2023 Medical Rec #:  [id number]            Height:       74.0 in Accession #:    [id number]           Weight:       252.2 lb Date of Birth:  07-Sep-1965            BSA:           2.399 m Patient Age:  57 years             BP:           111/54 mmHg Patient Gender: M                    HR:           95 bpm. Exam Location:  [facility] Procedure: 2D Echo, Cardiac Doppler and Color Doppler Indications:     Acute myocardial Infarction --unspecified I21.9  History:         Patient has no prior history of Echocardiogram examinations.                  Arrythmias:Atrial Fibrillation; Risk Factors:Dyslipidemia and                  Sleep Apnea.  Sonographer:     Cristela Blue Referring Phys:  Debbe Odea MD Diagnosing Phys: Debbe Odea MD  Sonographer Comments: Technically challenging study due to limited acoustic windows and no parasternal window. IMPRESSIONS  1. Left ventricular ejection fraction, by estimation, is 50 to 55%. Left ventricular ejection fraction by PLAX is 51 %. The left ventricle has low normal function. The left ventricle has no regional wall motion abnormalities. There is mild left ventricular hypertrophy. Left ventricular diastolic parameters are indeterminate.  2. Right ventricular systolic function is low normal. The right ventricular size is mildly enlarged. There is normal pulmonary artery systolic pressure.  3. The mitral valve is normal in structure. Mild mitral valve regurgitation.  4. The aortic valve was not well visualized. Aortic valve regurgitation is trivial.  5. The inferior vena cava is dilated in size with >50% respiratory variability, suggesting right atrial pressure of 8 mmHg. FINDINGS  Left Ventricle: Left ventricular ejection fraction, by estimation, is 50 to 55%. Left ventricular ejection fraction by PLAX is 51 %. The left ventricle has low normal function. The left ventricle has no regional wall motion abnormalities. The left ventricular internal cavity size was normal in size. There is mild left ventricular hypertrophy. Left ventricular diastolic parameters are indeterminate. Right Ventricle: The right ventricular size is mildly enlarged. No increase  in right ventricular wall thickness. Right ventricular systolic function is low normal. There is normal pulmonary artery systolic pressure. The tricuspid regurgitant velocity is 2.12 m/s, and with an assumed right atrial pressure of 8 mmHg, the estimated right ventricular systolic pressure is 26.0 mmHg. Left Atrium: Left atrial size was normal in size. Right Atrium: Right atrial size was normal in size. Pericardium: There is no evidence of pericardial effusion. Mitral Valve: The mitral valve is normal in structure. Mild mitral valve regurgitation. Tricuspid Valve: The tricuspid valve is normal in structure. Tricuspid valve regurgitation is not demonstrated. Aortic Valve: The aortic valve was not well visualized. Aortic valve regurgitation is trivial. Aortic valve mean gradient measures 4.0 mmHg. Aortic valve peak gradient measures 7.5 mmHg. Aortic valve area, by VTI measures 2.58 cm. Pulmonic Valve: The pulmonic valve was not well visualized. Pulmonic valve regurgitation is not visualized. Aorta: The aortic root is normal in size and structure. Venous: The inferior vena cava is dilated in size with greater than 50% respiratory variability, suggesting right atrial pressure of 8 mmHg. IAS/Shunts: No atrial level shunt detected by color flow Doppler.  LEFT VENTRICLE PLAX 2D LV EF:         Left ventricular ejection fraction by PLAX is 51 %. LVIDd:         3.90 cm LVIDs:  2.90 cm LV PW:         0.90 cm LV IVS:        1.40 cm LVOT diam:     2.20 cm LV SV:         46 LV SV Index:   19 LVOT Area:     3.80 cm  RIGHT VENTRICLE RV Basal diam:  4.40 cm RV Mid diam:    2.70 cm RV S prime:     11.50 cm/s TAPSE (M-mode): 1.9 cm LEFT ATRIUM             Index        RIGHT ATRIUM           Index LA diam:        3.00 cm 1.25 cm/m   RA Area:     18.70 cm LA Vol (A2C):   44.9 ml 18.72 ml/m  RA Volume:   52.60 ml  21.93 ml/m LA Vol (A4C):   60.8 ml 25.35 ml/m LA Biplane Vol: 57.3 ml 23.89 ml/m  AORTIC VALVE AV Area (Vmax):     2.46 cm AV Area (Vmean):   2.47 cm AV Area (VTI):     2.58 cm AV Vmax:           137.00 cm/s AV Vmean:          91.200 cm/s AV VTI:            0.178 m AV Peak Grad:      7.5 mmHg AV Mean Grad:      4.0 mmHg LVOT Vmax:         88.60 cm/s LVOT Vmean:        59.200 cm/s LVOT VTI:          0.121 m LVOT/AV VTI ratio: 0.68  AORTA Ao Root diam: 2.80 cm MITRAL VALVE               TRICUSPID VALVE MV Area (PHT): 3.30 cm    TR Peak grad:   18.0 mmHg MV Decel Time: 230 msec    TR Vmax:        212.00 cm/s MV E velocity: 78.60 cm/s                            SHUNTS                            Systemic VTI:  0.12 m                            Systemic Diam: 2.20 cm Debbe Odea MD Electronically signed by Debbe Odea MD Signature Date/Time: 01/12/2023/3:59:22 PM    Final    CARDIAC CATHETERIZATION  Result Date: 01/12/2023 Conclusions: Severe single-vessel coronary artery disease with thrombotic occlusion of mid RCA involving acute marginal branch as well as embolized thrombus in the RPDA.  There is also moderate, nonobstructive mid LAD disease of up to 50%. Moderately reduced left ventricular systolic function (LVEF 35-45%) with inferior akinesis. Mildly elevated left ventricular filling pressure (LVEDP 20 mmHg). Challenging but successful PCI to occluded mid RCA using Onyx frontier 3.0 x 18 mm drug-eluting stent with 0% residual stenosis and TIMI-3 flow.  Embolized thrombus in the PDA improved with angioplasty, though the distal vessel remained occluded at the end of the case.  Difficulty crossing the mid RCA  occlusion consistent with organizing thrombus in the setting of late presenting STEMI. Recommendations: Wean off norepinephrine as tolerated.  Suspect acute RV infarction from RCA/acute marginal disease may be contributing to hypotension. Continue tirofiban infusion for 18 hours.  Recommend starting heparin infusion tomorrow after discontinuation of tirofiban. Given atrial fibrillation and CHA2DS2-VASc score  of at least 2 (coronary artery disease and cardiomyopathy), the patient would benefit from long-term anticoagulation.  Favor transitioning from ticagrelor to clopidogrel prior to discharge, treating with aspirin, clopidogrel, and apixaban for 1 week, and subsequently maintaining apixaban and clopidogrel for 12 months. Aggressive secondary prevention of coronary artery disease. Follow-up echocardiogram. Yvonne Kendall, MD [facility]  DG Chest Port 1 View  Result Date: 01/12/2023 CLINICAL DATA:  Chest pain. EXAM: PORTABLE CHEST 1 VIEW COMPARISON:  X-ray 05/30/2018 FINDINGS: No consolidation, pneumothorax or effusion. Normal cardiopericardial silhouette without edema. Overlapping cardiac leads and defibrillator pads. Old right-sided rib fracture. IMPRESSION: No acute cardiopulmonary disease. Electronically Signed   By: Karen Kays M.D.   On: 01/12/2023 11:17     TELEMETRY reviewed by me (LT) 01/16/2023 : Atrial fibrillation rate 100s-110s  EKG reviewed by me: Atrial fibrillation with resolving inferior STE without new ischemic changes.  Data reviewed by me (LT) 01/16/2023:  hospitalist progress note last 24h vitals tele labs imaging I/O   Principal Problem:   STEMI involving right coronary artery [facility]) Active Problems:   Anxiety   Breathlessness on exertion   Adiposity   Paroxysmal atrial fibrillation (HCC)   ST elevation myocardial infarction involving right coronary artery (HCC)   AKI (acute kidney injury) (HCC)   CAD S/P percutaneous coronary angioplasty    ASSESSMENT AND PLAN:  Keith Torres. Keith Torres. Is a 57yoM with a PMH of paroxysmal AF, HTN, OSA on CPAP, HLD who presented to [facility] ED 01/12/2023 with with on and off chest tightness 5 days prior to presentation, which became more severe and persistent with radiation to his shoulders and associated with shortness of breath.  Initial EKG showed inferolateral ST elevations and reciprocal depressions, taken for emergent LHC by Dr. Okey Dupre with  successful PCI with DES to mid RCA, and improvement in the embolized thrombus to the PDA.  He required NE for blood pressure support following the PCI which has since been weaned off.  Hospital course also complicated by AF RVR, rate and rhythm controlled with metoprolol and amiodarone.  Echo this admission fortunately shows preserved LVEF 50-55% without RWMA's, mild MR, trivial AR.  # fever # constipation Tmax overnight 100.5, urine ok, blood cx and abd XR pending.   # Inferior STEMI Remains angina free. -Continue triple therapy with aspirin 81 mg, clopidogrel 75 mg, and Eliquis 5 mg twice daily for 2 weeks, then stop aspirin and continue clopidogrel and Eliquis. -Atorvastatin 40 mg daily -Metoprolol tartrate 25mg  q6h, consolidate to BID dosing at discharge -Lisinopril 5 mg daily -discussed new meds, arm precautions x 1 week, and cardiac rehab again with the patient and wife  # Paroxysmal AF RVR Remain in AF with rate 110s with paroxysms to 120s.  He feels somewhat symptomatic with generalized weakness but no heart racing or palpitations.  -Continue amiodarone 200 mg twice daily x 10 days, then 200 mg daily thereafter -Continue metoprolol tartrate -Continue Eliquis 5 mg twice daily for stroke prevention.  CHA2DS2-VASc 1 (CAD).  - monitor and replenish electrolytes for goal K>4 and mag >2 -Will need close outpatient follow-up for consideration of DCCV following 4 weeks of appropriate anticoagulation, should the patient  remain in atrial fibrillation.  # Acute on chronic HFpEF Not clinically hypervolemic on exam, clinically improved after IV diuresis on 8/12.  -Possible discharge home on 40 mg of p.o. Lasix daily.  This patient's plan of care was discussed and created with Dr. Juliann Pares and he is in agreement.  Signed: Rebeca Allegra , PA-C 01/16/2023, 10:09 AM [facility] Cardiology

## 2023-02-07 ENCOUNTER — Encounter: Payer: BC Managed Care – PPO | Attending: Internal Medicine

## 2023-02-07 ENCOUNTER — Other Ambulatory Visit: Payer: Self-pay

## 2023-02-07 DIAGNOSIS — I252 Old myocardial infarction: Secondary | ICD-10-CM | POA: Insufficient documentation

## 2023-02-07 DIAGNOSIS — Z955 Presence of coronary angioplasty implant and graft: Secondary | ICD-10-CM | POA: Insufficient documentation

## 2023-02-07 DIAGNOSIS — Z48812 Encounter for surgical aftercare following surgery on the circulatory system: Secondary | ICD-10-CM | POA: Insufficient documentation

## 2023-02-07 DIAGNOSIS — I213 ST elevation (STEMI) myocardial infarction of unspecified site: Secondary | ICD-10-CM

## 2023-02-07 NOTE — Progress Notes (Signed)
Virtual Visit completed. Patient informed on EP and RD appointment and 6 Minute walk test. Patient also informed of patient health questionnaires on My Chart. Patient Verbalizes understanding. Visit diagnosis can be found in [facility] 01/12/2023.

## 2023-02-14 ENCOUNTER — Ambulatory Visit: Payer: BC Managed Care – PPO

## 2023-02-26 VITALS — Ht 74.0 in | Wt 233.5 lb

## 2023-02-26 DIAGNOSIS — Z955 Presence of coronary angioplasty implant and graft: Secondary | ICD-10-CM

## 2023-02-26 DIAGNOSIS — I252 Old myocardial infarction: Secondary | ICD-10-CM | POA: Diagnosis not present

## 2023-02-26 DIAGNOSIS — Z48812 Encounter for surgical aftercare following surgery on the circulatory system: Secondary | ICD-10-CM | POA: Diagnosis present

## 2023-02-26 DIAGNOSIS — I213 ST elevation (STEMI) myocardial infarction of unspecified site: Secondary | ICD-10-CM

## 2023-02-26 NOTE — Patient Instructions (Addendum)
Patient Instructions  Patient Details  Name: Keith Torres. MRN: [id number] Date of Birth: 04-16-1966 Referring Provider:  Clotilde Dieter, DO  Below are your personal goals for exercise, nutrition, and risk factors. Our goal is to help you stay on track towards obtaining and maintaining these goals. We will be discussing your progress on these goals with you throughout the program.  Initial Exercise Prescription:  Initial Exercise Prescription - 02/26/23 1100       Date of Initial Exercise RX and Referring Provider   Date 02/26/23    Referring Provider Dr. Clotilde Dieter, DO      Oxygen   Maintain Oxygen Saturation 88% or higher      Treadmill   MPH 1.8    Grade 0    Minutes 15    METs 2.38      Recumbant Bike   Level 2    RPM 50    Watts 36    Minutes 15    METs 3.07      NuStep   Level 2    SPM 80    Minutes 15    METs 3.07      REL-XR   Level 2    Speed 50    Minutes 15    METs 3.07      Track   Laps 30    Minutes 15    METs 3.07      Prescription Details   Frequency (times per week) 3    Duration Progress to 30 minutes of continuous aerobic without signs/symptoms of physical distress      Intensity   THRR 40-80% of Max Heartrate 129-151    Ratings of Perceived Exertion 11-13    Perceived Dyspnea 0-4      Resistance Training   Training Prescription Yes    Weight 5 lb    Reps 10-15             Exercise Goals: Frequency: Be able to perform aerobic exercise two to three times per week in program working toward 2-5 days per week of home exercise.  Intensity: Work with a perceived exertion of 11 (fairly light) - 15 (hard) while following your exercise prescription.  We will make changes to your prescription with you as you progress through the program.   Duration: Be able to do 30 to 45 minutes of continuous aerobic exercise in addition to a 5 minute warm-up and a 5 minute cool-down routine.   Nutrition Goals: Your personal nutrition  goals will be established when you do your nutrition analysis with the dietician.  The following are general nutrition guidelines to follow: Cholesterol < 200mg /day Sodium < 1500mg /day Fiber: Men over 50 yrs - 30 grams per day  Personal Goals:  Personal Goals and Risk Factors at Admission - 02/07/23 0909       Core Components/Risk Factors/Patient Goals on Admission    Weight Management Yes;Weight Maintenance    Intervention Weight Management: Develop a combined nutrition and exercise program designed to reach desired caloric intake, while maintaining appropriate intake of nutrient and fiber, sodium and fats, and appropriate energy expenditure required for the weight goal.;Weight Management: Provide education and appropriate resources to help participant work on and attain dietary goals.;Weight Management/Obesity: Establish reasonable short term and long term weight goals.    Expected Outcomes Short Term: Continue to assess and modify interventions until short term weight is achieved;Long Term: Adherence to nutrition and physical activity/exercise program aimed toward attainment of established weight  goal;Weight Maintenance: Understanding of the daily nutrition guidelines, which includes 25-35% calories from fat, 7% or less cal from saturated fats, less than 200mg  cholesterol, less than 1.5gm of sodium, & 5 or more servings of fruits and vegetables daily;Understanding recommendations for meals to include 15-35% energy as protein, 25-35% energy from fat, 35-60% energy from carbohydrates, less than 200mg  of dietary cholesterol, 20-35 gm of total fiber daily;Understanding of distribution of calorie intake throughout the day with the consumption of 4-5 meals/snacks    Hypertension Yes    Intervention Provide education on lifestyle modifcations including regular physical activity/exercise, weight management, moderate sodium restriction and increased consumption of fresh fruit, vegetables, and low fat  dairy, alcohol moderation, and smoking cessation.;Monitor prescription use compliance.    Expected Outcomes Short Term: Continued assessment and intervention until BP is < 140/30mm HG in hypertensive participants. < 130/62mm HG in hypertensive participants with diabetes, heart failure or chronic kidney disease.;Long Term: Maintenance of blood pressure at goal levels.    Lipids Yes    Intervention Provide education and support for participant on nutrition & aerobic/resistive exercise along with prescribed medications to achieve LDL 70mg , HDL >40mg .    Expected Outcomes Short Term: Participant states understanding of desired cholesterol values and is compliant with medications prescribed. Participant is following exercise prescription and nutrition guidelines.;Long Term: Cholesterol controlled with medications as prescribed, with individualized exercise RX and with personalized nutrition plan. Value goals: LDL < 70mg , HDL > 40 mg.             Exercise Goals and Review:  Exercise Goals     Row Name 02/26/23 1139             Exercise Goals   Increase Physical Activity Yes       Intervention Provide advice, education, support and counseling about physical activity/exercise needs.;Develop an individualized exercise prescription for aerobic and resistive training based on initial evaluation findings, risk stratification, comorbidities and participant's personal goals.       Expected Outcomes Short Term: Attend rehab on a regular basis to increase amount of physical activity.;Long Term: Add in home exercise to make exercise part of routine and to increase amount of physical activity.;Long Term: Exercising regularly at least 3-5 days a week.       Increase Strength and Stamina Yes       Intervention Provide advice, education, support and counseling about physical activity/exercise needs.;Develop an individualized exercise prescription for aerobic and resistive training based on initial evaluation  findings, risk stratification, comorbidities and participant's personal goals.       Expected Outcomes Short Term: Increase workloads from initial exercise prescription for resistance, speed, and METs.;Short Term: Perform resistance training exercises routinely during rehab and add in resistance training at home;Long Term: Improve cardiorespiratory fitness, muscular endurance and strength as measured by increased METs and functional capacity ( )       Able to understand and use rate of perceived exertion (RPE) scale Yes       Intervention Provide education and explanation on how to use RPE scale       Expected Outcomes Short Term: Able to use RPE daily in rehab to express subjective intensity level;Long Term:  Able to use RPE to guide intensity level when exercising independently       Able to understand and use Dyspnea scale Yes       Intervention Provide education and explanation on how to use Dyspnea scale       Expected Outcomes Short Term: Able  to use Dyspnea scale daily in rehab to express subjective sense of shortness of breath during exertion;Long Term: Able to use Dyspnea scale to guide intensity level when exercising independently       Knowledge and understanding of Target Heart Rate Range (THRR) Yes       Intervention Provide education and explanation of THRR including how the numbers were predicted and where they are located for reference       Expected Outcomes Short Term: Able to state/look up THRR;Long Term: Able to use THRR to govern intensity when exercising independently;Short Term: Able to use daily as guideline for intensity in rehab       Able to check pulse independently Yes       Intervention Review the importance of being able to check your own pulse for safety during independent exercise;Provide education and demonstration on how to check pulse in carotid and radial arteries.       Expected Outcomes Short Term: Able to explain why pulse checking is important during  independent exercise;Long Term: Able to check pulse independently and accurately       Understanding of Exercise Prescription Yes       Intervention Provide education, explanation, and written materials on patient's individual exercise prescription       Expected Outcomes Short Term: Able to explain program exercise prescription;Long Term: Able to explain home exercise prescription to exercise independently

## 2023-02-26 NOTE — Progress Notes (Signed)
Cardiac Individual Treatment Plan  Patient Details  Name: Keith Torres. MRN: [id number] Date of Birth: [birth year] Referring Provider:   Flowsheet Row Cardiac Rehab from 02/26/2023 in [facility] Cardiac and Pulmonary Rehab  Referring Provider Dr. Clotilde Dieter, DO       Initial Encounter Date:  Flowsheet Row Cardiac Rehab from 02/26/2023 in [facility] Cardiac and Pulmonary Rehab  Date 02/26/23       Visit Diagnosis: ST elevation myocardial infarction (STEMI), unspecified artery [facility])  Status post coronary artery stent placement  Patient's Home Medications on Admission:  Current Outpatient Medications:    amiodarone (PACERONE) 200 MG tablet, Take 1 tablet (200 mg total) by mouth 2 (two) times daily for 9 days, THEN 1 tablet (200 mg total) daily. (Patient not taking: Reported on 02/07/2023), Disp: 48 tablet, Rfl: 0   apixaban (ELIQUIS) 5 MG TABS tablet, Take 1 tablet (5 mg total) by mouth 2 (two) times daily., Disp: 60 tablet, Rfl: 1   atorvastatin (LIPITOR) 40 MG tablet, Take 1 tablet (40 mg total) by mouth daily. (Patient not taking: Reported on 02/07/2023), Disp: 30 tablet, Rfl: 1   benzonatate (TESSALON) 200 MG capsule, Take 1 capsule (200 mg total) by mouth 3 (three) times daily as needed for cough., Disp: 30 capsule, Rfl: 0   clopidogrel (PLAVIX) 75 MG tablet, Take 1 tablet (75 mg total) by mouth daily., Disp: 30 tablet, Rfl: 1   empagliflozin (JARDIANCE) 10 MG TABS tablet, Take 1 tablet by mouth daily., Disp: , Rfl:    furosemide (LASIX) 20 MG tablet, Take 1 tablet (20 mg total) by mouth daily., Disp: 30 tablet, Rfl: 1   meloxicam (MOBIC) 7.5 MG tablet, Take 1 tablet by mouth daily. (Patient not taking: Reported on 02/07/2023), Disp: , Rfl:    metoprolol tartrate (LOPRESSOR) 50 MG tablet, Take 1 tablet (50 mg total) by mouth 2 (two) times daily. (Patient not taking: Reported on 02/07/2023), Disp: 60 tablet, Rfl: 1   metoprolol tartrate (LOPRESSOR) 50 MG tablet, Take 1 tablet by mouth 3  (three) times daily., Disp: , Rfl:    simethicone (MYLICON) 80 MG chewable tablet, Chew 1 tablet (80 mg total) by mouth 4 (four) times daily. (Patient not taking: Reported on 02/07/2023), Disp: 30 tablet, Rfl: 0   simethicone (MYLICON) 80 MG chewable tablet, Chew by mouth. (Patient not taking: Reported on 02/07/2023), Disp: , Rfl:    Spacer/Aero Chamber Mouthpiece MISC, One spacer to use with albuterol inhaler. (Patient not taking: Reported on 02/07/2023), Disp: 1 each, Rfl: 0  Past Medical History: Past Medical History:  Diagnosis Date   Anxiety    Atrial fibrillation (HCC)    Dental crowns present    FRONT TWO TEETH   Deviated septum    Dysrhythmia    A-FIB/ DR Gwen Pounds   Hyperlipidemia    Hypertrophy of nasal turbinates    Meningitis    Obesity    Rhinitis, allergic    Seasonal allergies    Sleep apnea    C-PAP    Tobacco Use: Social History   Tobacco Use  Smoking Status Never  Smokeless Tobacco Former   Types: Chew   Quit date: 1999    Labs: Review Flowsheet       Latest Ref Rng & Units 04/10/2017 01/12/2023 01/13/2023  Labs for ITP Cardiac and Pulmonary Rehab  Cholestrol 0 - 200 mg/dL 829  562  130   LDL (calc) 0 - 99 mg/dL 865  784  77   HDL-C >69 mg/dL  52  51  54   Trlycerides <150 mg/dL 956  387  90   Hemoglobin A1c 4.8 - 5.6 % - - 5.4     Details             Exercise Target Goals: Exercise Program Goal: Individual exercise prescription set using results from initial 6 min walk test and THRR while considering  patient's activity barriers and safety.   Exercise Prescription Goal: Initial exercise prescription builds to 30-45 minutes a day of aerobic activity, 2-3 days per week.  Home exercise guidelines will be given to patient during program as part of exercise prescription that the participant will acknowledge.   Education: Aerobic Exercise: - Group verbal and visual presentation on the components of exercise prescription. Introduces F.I.T.T principle  from ACSM for exercise prescriptions.  Reviews F.I.T.T. principles of aerobic exercise including progression. Written material given at graduation. Flowsheet Row Cardiac Rehab from 02/26/2023 in [facility] Cardiac and Pulmonary Rehab  Education need identified 02/26/23       Education: Resistance Exercise: - Group verbal and visual presentation on the components of exercise prescription. Introduces F.I.T.T principle from ACSM for exercise prescriptions  Reviews F.I.T.T. principles of resistance exercise including progression. Written material given at graduation.    Education: Exercise & Equipment Safety: - Individual verbal instruction and demonstration of equipment use and safety with use of the equipment. Flowsheet Row Cardiac Rehab from 02/26/2023 in [facility] Cardiac and Pulmonary Rehab  Date 02/07/23  Educator [facility]  Instruction Review Code 1- Verbalizes Understanding       Education: Exercise Physiology & General Exercise Guidelines: - Group verbal and written instruction with models to review the exercise physiology of the cardiovascular system and associated critical values. Provides general exercise guidelines with specific guidelines to those with heart or lung disease.    Education: Flexibility, Balance, Mind/Body Relaxation: - Group verbal and visual presentation with interactive activity on the components of exercise prescription. Introduces F.I.T.T principle from ACSM for exercise prescriptions. Reviews F.I.T.T. principles of flexibility and balance exercise training including progression. Also discusses the mind body connection.  Reviews various relaxation techniques to help reduce and manage stress (i.e. Deep breathing, progressive muscle relaxation, and visualization). Balance handout provided to take home. Written material given at graduation.   Activity Barriers & Risk Stratification:  Activity Barriers & Cardiac Risk Stratification - 02/26/23 1139       Activity Barriers &  Cardiac Risk Stratification   Activity Barriers Other (comment)    Comments Osteoarthritis knee, Gout    Cardiac Risk Stratification Moderate             6 Minute Walk:  6 Minute Walk     Row Name 02/26/23 1136         6 Minute Walk   Phase Initial     Distance 1010 feet     Walk Time 6 minutes     # of Rest Breaks 0     MPH 1.91     METS 3.07     RPE 13     Perceived Dyspnea  2     VO2 Peak 10.75     Symptoms No     Resting HR 108 bpm     Resting BP 94/60     Resting Oxygen Saturation  98 %     Exercise Oxygen Saturation  during 6 min walk 96 %     Max Ex. HR 133 bpm     Max Ex. BP 108/66  2 Minute Post BP 98/64              Oxygen Initial Assessment:   Oxygen Re-Evaluation:   Oxygen Discharge (Final Oxygen Re-Evaluation):   Initial Exercise Prescription:  Initial Exercise Prescription - 02/26/23 1100       Date of Initial Exercise RX and Referring Provider   Date 02/26/23    Referring Provider Dr. Clotilde Dieter, DO      Oxygen   Maintain Oxygen Saturation 88% or higher      Treadmill   MPH 1.8    Grade 0    Minutes 15    METs 2.38      Recumbant Bike   Level 2    RPM 50    Watts 36    Minutes 15    METs 3.07      NuStep   Level 2    SPM 80    Minutes 15    METs 3.07      REL-XR   Level 2    Speed 50    Minutes 15    METs 3.07      Track   Laps 30    Minutes 15    METs 3.07      Prescription Details   Frequency (times per week) 3    Duration Progress to 30 minutes of continuous aerobic without signs/symptoms of physical distress      Intensity   THRR 40-80% of Max Heartrate 129-151    Ratings of Perceived Exertion 11-13    Perceived Dyspnea 0-4      Resistance Training   Training Prescription Yes    Weight 5 lb    Reps 10-15             Perform Capillary Blood Glucose checks as needed.  Exercise Prescription Changes:   Exercise Prescription Changes     Row Name 02/26/23 1100              Response to Exercise   Blood Pressure (Admit) 94/60       Blood Pressure (Exercise) 108/66       Blood Pressure (Exit) 98/64       Heart Rate (Admit) 108 bpm       Heart Rate (Exercise) 133 bpm       Heart Rate (Exit) 122 bpm       Oxygen Saturation (Admit) 98 %       Oxygen Saturation (Exercise) 96 %       Rating of Perceived Exertion (Exercise) 13       Perceived Dyspnea (Exercise) 2       Symptoms none       Comments Results                Exercise Comments:   Exercise Goals and Review:   Exercise Goals     Row Name 02/26/23 1139             Exercise Goals   Increase Physical Activity Yes       Intervention Provide advice, education, support and counseling about physical activity/exercise needs.;Develop an individualized exercise prescription for aerobic and resistive training based on initial evaluation findings, risk stratification, comorbidities and participant's personal goals.       Expected Outcomes Short Term: Attend rehab on a regular basis to increase amount of physical activity.;Long Term: Add in home exercise to make exercise part of routine and to increase amount of physical activity.;Long Term: Exercising regularly  at least 3-5 days a week.       Increase Strength and Stamina Yes       Intervention Provide advice, education, support and counseling about physical activity/exercise needs.;Develop an individualized exercise prescription for aerobic and resistive training based on initial evaluation findings, risk stratification, comorbidities and participant's personal goals.       Expected Outcomes Short Term: Increase workloads from initial exercise prescription for resistance, speed, and METs.;Short Term: Perform resistance training exercises routinely during rehab and add in resistance training at home;Long Term: Improve cardiorespiratory fitness, muscular endurance and strength as measured by increased METs and functional capacity ( )       Able to  understand and use rate of perceived exertion (RPE) scale Yes       Intervention Provide education and explanation on how to use RPE scale       Expected Outcomes Short Term: Able to use RPE daily in rehab to express subjective intensity level;Long Term:  Able to use RPE to guide intensity level when exercising independently       Able to understand and use Dyspnea scale Yes       Intervention Provide education and explanation on how to use Dyspnea scale       Expected Outcomes Short Term: Able to use Dyspnea scale daily in rehab to express subjective sense of shortness of breath during exertion;Long Term: Able to use Dyspnea scale to guide intensity level when exercising independently       Knowledge and understanding of Target Heart Rate Range (THRR) Yes       Intervention Provide education and explanation of THRR including how the numbers were predicted and where they are located for reference       Expected Outcomes Short Term: Able to state/look up THRR;Long Term: Able to use THRR to govern intensity when exercising independently;Short Term: Able to use daily as guideline for intensity in rehab       Able to check pulse independently Yes       Intervention Review the importance of being able to check your own pulse for safety during independent exercise;Provide education and demonstration on how to check pulse in carotid and radial arteries.       Expected Outcomes Short Term: Able to explain why pulse checking is important during independent exercise;Long Term: Able to check pulse independently and accurately       Understanding of Exercise Prescription Yes       Intervention Provide education, explanation, and written materials on patient's individual exercise prescription       Expected Outcomes Short Term: Able to explain program exercise prescription;Long Term: Able to explain home exercise prescription to exercise independently                Exercise Goals Re-Evaluation  :   Discharge Exercise Prescription (Final Exercise Prescription Changes):  Exercise Prescription Changes - 02/26/23 1100       Response to Exercise   Blood Pressure (Admit) 94/60    Blood Pressure (Exercise) 108/66    Blood Pressure (Exit) 98/64    Heart Rate (Admit) 108 bpm    Heart Rate (Exercise) 133 bpm    Heart Rate (Exit) 122 bpm    Oxygen Saturation (Admit) 98 %    Oxygen Saturation (Exercise) 96 %    Rating of Perceived Exertion (Exercise) 13    Perceived Dyspnea (Exercise) 2    Symptoms none    Comments Results  Nutrition:  Target Goals: Understanding of nutrition guidelines, daily intake of sodium 1500mg , cholesterol 200mg , calories 30% from fat and 7% or less from saturated fats, daily to have 5 or more servings of fruits and vegetables.  Education: All About Nutrition: -Group instruction provided by verbal, written material, interactive activities, discussions, models, and posters to present general guidelines for heart healthy nutrition including fat, fiber, MyPlate, the role of sodium in heart healthy nutrition, utilization of the nutrition label, and utilization of this knowledge for meal planning. Follow up email sent as well. Written material given at graduation. Flowsheet Row Cardiac Rehab from 02/26/2023 in [facility] Cardiac and Pulmonary Rehab  Education need identified 02/26/23       Biometrics:  Pre Biometrics - 02/26/23 1140       Pre Biometrics   Height 6\' 2"  (1.88 m)    Weight 233 lb 8 oz (105.9 kg)    Waist Circumference 41.5 inches    Hip Circumference 43.5 inches    Waist to Hip Ratio 0.95 %    BMI (Calculated) 29.97    Single Leg Stand 10.1 seconds              Nutrition Therapy Plan and Nutrition Goals:  Nutrition Therapy & Goals - 02/26/23 1147       Intervention Plan   Intervention Prescribe, educate and counsel regarding individualized specific dietary modifications aiming towards targeted core components  such as weight, hypertension, lipid management, diabetes, heart failure and other comorbidities.    Expected Outcomes Short Term Goal: Understand basic principles of dietary content, such as calories, fat, sodium, cholesterol and nutrients.;Short Term Goal: A plan has been developed with personal nutrition goals set during dietitian appointment.;Long Term Goal: Adherence to prescribed nutrition plan.             Nutrition Assessments:  MEDIFICTS Score Key: >=70 Need to make dietary changes  40-70 Heart Healthy Diet <= 40 Therapeutic Level Cholesterol Diet  Flowsheet Row Cardiac Rehab from 02/26/2023 in [facility] Cardiac and Pulmonary Rehab  Picture Your Plate Total Score on Admission 47      Picture Your Plate Scores: <95 Unhealthy dietary pattern with much room for improvement. 41-50 Dietary pattern unlikely to meet recommendations for good health and room for improvement. 51-60 More healthful dietary pattern, with some room for improvement.  >60 Healthy dietary pattern, although there may be some specific behaviors that could be improved.    Nutrition Goals Re-Evaluation:   Nutrition Goals Discharge (Final Nutrition Goals Re-Evaluation):   Psychosocial: Target Goals: Acknowledge presence or absence of significant depression and/or stress, maximize coping skills, provide positive support system. Participant is able to verbalize types and ability to use techniques and skills needed for reducing stress and depression.   Education: Stress, Anxiety, and Depression - Group verbal and visual presentation to define topics covered.  Reviews how body is impacted by stress, anxiety, and depression.  Also discusses healthy ways to reduce stress and to treat/manage anxiety and depression.  Written material given at graduation. Flowsheet Row Cardiac Rehab from 02/26/2023 in [facility] Cardiac and Pulmonary Rehab  Education need identified 02/26/23       Education: Sleep Hygiene -Provides group  verbal and written instruction about how sleep can affect your health.  Define sleep hygiene, discuss sleep cycles and impact of sleep habits. Review good sleep hygiene tips.    Initial Review & Psychosocial Screening:  Initial Psych Review & Screening - 02/07/23 0910       Initial Review  Current issues with None Identified      Family Dynamics   Good Support System? Yes    Comments He can look to his wife and  his son for support.  He has a good outlook on his health and is ready to start rehab .      Barriers   Psychosocial barriers to participate in program There are no identifiable barriers or psychosocial needs.;The patient should benefit from training in stress management and relaxation.      Screening Interventions   Interventions Encouraged to exercise;To provide support and resources with identified psychosocial needs;Provide feedback about the scores to participant    Expected Outcomes Short Term goal: Utilizing psychosocial counselor, staff and physician to assist with identification of specific Stressors or current issues interfering with healing process. Setting desired goal for each stressor or current issue identified.;Long Term Goal: Stressors or current issues are controlled or eliminated.;Long Term goal: The participant improves quality of Life and PHQ9 Scores as seen by post scores and/or verbalization of changes;Short Term goal: Identification and review with participant of any Quality of Life or Depression concerns found by scoring the questionnaire.             Quality of Life Scores:   Quality of Life - 02/26/23 1146       Quality of Life   Select Quality of Life      Quality of Life Scores   Health/Function Pre 18.9 %    Socioeconomic Pre 30 %    Psych/Spiritual Pre 28.07 %    Family Pre 27.6 %    GLOBAL Pre 24.18 %            Scores of 19 and below usually indicate a poorer quality of life in these areas.  A difference of  2-3 points is a  clinically meaningful difference.  A difference of 2-3 points in the total score of the Quality of Life Index has been associated with significant improvement in overall quality of life, self-image, physical symptoms, and general health in studies assessing change in quality of life.  PHQ-9: Review Flowsheet       02/26/2023  Depression screen PHQ 2/9  Decreased Interest 0  Down, Depressed, Hopeless 0  PHQ - 2 Score 0  Altered sleeping 1  Tired, decreased energy 3  Change in appetite 0  Feeling bad or failure about yourself  0  Trouble concentrating 0  Moving slowly or fidgety/restless 1  Suicidal thoughts 0  PHQ-9 Score 5  Difficult doing work/chores Very difficult    Details           Interpretation of Total Score  Total Score Depression Severity:  1-4 = Minimal depression, 5-9 = Mild depression, 10-14 = Moderate depression, 15-19 = Moderately severe depression, 20-27 = Severe depression   Psychosocial Evaluation and Intervention:  Psychosocial Evaluation - 02/07/23 0913       Psychosocial Evaluation & Interventions   Interventions Relaxation education;Stress management education;Encouraged to exercise with the program and follow exercise prescription    Comments He can look to his wife and his son for support. He has a good outlook on his health and is ready to start rehab .    Expected Outcomes Short: Start [facility] to help with mood. Long: Maintain a healthy mental state    Continue Psychosocial Services  Follow up required by staff             Psychosocial Re-Evaluation:   Psychosocial Discharge (Final Psychosocial  Re-Evaluation):   Vocational Rehabilitation: Provide vocational rehab assistance to qualifying candidates.   Vocational Rehab Evaluation & Intervention:   Education: Education Goals: Education classes will be provided on a variety of topics geared toward better understanding of heart health and risk factor modification. Participant will  state understanding/return demonstration of topics presented as noted by education test scores.  Learning Barriers/Preferences:  Learning Barriers/Preferences - 02/07/23 1610       Learning Barriers/Preferences   Learning Barriers None    Learning Preferences None             General Cardiac Education Topics:  AED/CPR: - Group verbal and written instruction with the use of models to demonstrate the basic use of the AED with the basic ABC's of resuscitation.   Anatomy and Cardiac Procedures: - Group verbal and visual presentation and models provide information about basic cardiac anatomy and function. Reviews the testing methods done to diagnose heart disease and the outcomes of the test results. Describes the treatment choices: Medical Management, Angioplasty, or Coronary Bypass Surgery for treating various heart conditions including Myocardial Infarction, Angina, Valve Disease, and Cardiac Arrhythmias.  Written material given at graduation. Flowsheet Row Cardiac Rehab from 02/26/2023 in [facility] Cardiac and Pulmonary Rehab  Education need identified 02/26/23       Medication Safety: - Group verbal and visual instruction to review commonly prescribed medications for heart and lung disease. Reviews the medication, class of the drug, and side effects. Includes the steps to properly store meds and maintain the prescription regimen.  Written material given at graduation.   Intimacy: - Group verbal instruction through game format to discuss how heart and lung disease can affect sexual intimacy. Written material given at graduation..   Know Your Numbers and Heart Failure: - Group verbal and visual instruction to discuss disease risk factors for cardiac and pulmonary disease and treatment options.  Reviews associated critical values for Overweight/Obesity, Hypertension, Cholesterol, and Diabetes.  Discusses basics of heart failure: signs/symptoms and treatments.  Introduces Heart Failure  Zone chart for action plan for heart failure.  Written material given at graduation.   Infection Prevention: - Provides verbal and written material to individual with discussion of infection control including proper hand washing and proper equipment cleaning during exercise session. Flowsheet Row Cardiac Rehab from 02/26/2023 in [facility] Cardiac and Pulmonary Rehab  Date 02/07/23  Educator [facility]  Instruction Review Code 1- Verbalizes Understanding       Falls Prevention: - Provides verbal and written material to individual with discussion of falls prevention and safety. Flowsheet Row Cardiac Rehab from 02/26/2023 in [facility] Cardiac and Pulmonary Rehab  Date 02/07/23  Educator [facility]  Instruction Review Code 1- Verbalizes Understanding       Other: -Provides group and verbal instruction on various topics (see comments)   Knowledge Questionnaire Score:  Knowledge Questionnaire Score - 02/26/23 1147       Knowledge Questionnaire Score   Pre Score 17/26             Core Components/Risk Factors/Patient Goals at Admission:  Personal Goals and Risk Factors at Admission - 02/07/23 0909       Core Components/Risk Factors/Patient Goals on Admission    Weight Management Yes;Weight Maintenance    Intervention Weight Management: Develop a combined nutrition and exercise program designed to reach desired caloric intake, while maintaining appropriate intake of nutrient and fiber, sodium and fats, and appropriate energy expenditure required for the weight goal.;Weight Management: Provide education and appropriate resources to help  participant work on and attain dietary goals.;Weight Management/Obesity: Establish reasonable short term and long term weight goals.    Expected Outcomes Short Term: Continue to assess and modify interventions until short term weight is achieved;Long Term: Adherence to nutrition and physical activity/exercise program aimed toward attainment of established weight goal;Weight  Maintenance: Understanding of the daily nutrition guidelines, which includes 25-35% calories from fat, 7% or less cal from saturated fats, less than 200mg  cholesterol, less than 1.5gm of sodium, & 5 or more servings of fruits and vegetables daily;Understanding recommendations for meals to include 15-35% energy as protein, 25-35% energy from fat, 35-60% energy from carbohydrates, less than 200mg  of dietary cholesterol, 20-35 gm of total fiber daily;Understanding of distribution of calorie intake throughout the day with the consumption of 4-5 meals/snacks    Hypertension Yes    Intervention Provide education on lifestyle modifcations including regular physical activity/exercise, weight management, moderate sodium restriction and increased consumption of fresh fruit, vegetables, and low fat dairy, alcohol moderation, and smoking cessation.;Monitor prescription use compliance.    Expected Outcomes Short Term: Continued assessment and intervention until BP is < 140/12mm HG in hypertensive participants. < 130/47mm HG in hypertensive participants with diabetes, heart failure or chronic kidney disease.;Long Term: Maintenance of blood pressure at goal levels.    Lipids Yes    Intervention Provide education and support for participant on nutrition & aerobic/resistive exercise along with prescribed medications to achieve LDL 70mg , HDL >40mg .    Expected Outcomes Short Term: Participant states understanding of desired cholesterol values and is compliant with medications prescribed. Participant is following exercise prescription and nutrition guidelines.;Long Term: Cholesterol controlled with medications as prescribed, with individualized exercise RX and with personalized nutrition plan. Value goals: LDL < 70mg , HDL > 40 mg.             Education:Diabetes - Individual verbal and written instruction to review signs/symptoms of diabetes, desired ranges of glucose level fasting, after meals and with exercise.  Acknowledge that pre and post exercise glucose checks will be done for 3 sessions at entry of program.   Core Components/Risk Factors/Patient Goals Review:    Core Components/Risk Factors/Patient Goals at Discharge (Final Review):    ITP Comments:  ITP Comments     Row Name 02/07/23 0908 02/26/23 1135         ITP Comments Virtual Visit completed. Patient informed on EP and RD appointment and 6 Minute walk test. Patient also informed of patient health questionnaires on My Chart. Patient Verbalizes understanding. Visit diagnosis can be found in [facility] 01/12/2023. Completed and gym orientation. Initial ITP created and sent for review to Dr. Bethann Punches, Medical Director.               Comments: Initial ITP

## 2023-02-28 ENCOUNTER — Encounter: Payer: BC Managed Care – PPO | Admitting: *Deleted

## 2023-02-28 DIAGNOSIS — I213 ST elevation (STEMI) myocardial infarction of unspecified site: Secondary | ICD-10-CM

## 2023-02-28 DIAGNOSIS — Z955 Presence of coronary angioplasty implant and graft: Secondary | ICD-10-CM

## 2023-02-28 DIAGNOSIS — Z48812 Encounter for surgical aftercare following surgery on the circulatory system: Secondary | ICD-10-CM | POA: Diagnosis not present

## 2023-02-28 NOTE — Progress Notes (Signed)
Daily Session Note  Patient Details  Name: Keith Torres. MRN: [id number] Date of Birth: [birth year] Referring Provider:   Flowsheet Row Cardiac Rehab from 02/26/2023 in [facility] Cardiac and Pulmonary Rehab  Referring Provider Dr. Clotilde Dieter, DO       Encounter Date: 02/28/2023  Check In:  Session Check In - 02/28/23 0755       Check-In   Supervising physician immediately available to respond to emergencies See telemetry face sheet for immediately available ER MD    Location [facility]-Cardiac & Pulmonary Rehab    Staff Present Bess Kinds RN, BSN;Laureen Manson Passey, BS, RRT, CPFT;Malajah Oceguera Katrinka Blazing, RN, ADN    Virtual Visit No    Medication changes reported     No    Fall or balance concerns reported    No    Warm-up and Cool-down Performed on first and last piece of equipment    Resistance Training Performed Yes    VAD Patient? No    PAD/SET Patient? No      Pain Assessment   Currently in Pain? No/denies                Social History   Tobacco Use  Smoking Status Never  Smokeless Tobacco Former   Types: Chew   Quit date: 1999    Goals Met:  Independence with exercise equipment Exercise tolerated well No report of concerns or symptoms today Strength training completed today  Goals Unmet:  Not Applicable  Comments: First full day of exercise!  Patient was oriented to gym and equipment including functions, settings, policies, and procedures.  Patient's individual exercise prescription and treatment plan were reviewed.  All starting workloads were established based on the results of the 6 minute walk test done at initial orientation visit.  The plan for exercise progression was also introduced and progression will be customized based on patient's performance and goals.    Dr. Bethann Punches is Medical Director for [facility] Cardiac Rehabilitation.  Dr. Vida Rigger is Medical Director for [facility] Pulmonary Rehabilitation.

## 2023-03-02 DIAGNOSIS — Z48812 Encounter for surgical aftercare following surgery on the circulatory system: Secondary | ICD-10-CM | POA: Diagnosis not present

## 2023-03-02 DIAGNOSIS — Z955 Presence of coronary angioplasty implant and graft: Secondary | ICD-10-CM

## 2023-03-02 DIAGNOSIS — I213 ST elevation (STEMI) myocardial infarction of unspecified site: Secondary | ICD-10-CM

## 2023-03-02 NOTE — Progress Notes (Signed)
Daily Session Note  Patient Details  Name: Keith Torres. MRN: [id number] Date of Birth: 19-Sep-1965 Referring Provider:   Flowsheet Row Cardiac Rehab from 02/26/2023 in [facility] Cardiac and Pulmonary Rehab  Referring Provider Dr. Clotilde Dieter, DO       Encounter Date: 03/02/2023  Check In:  Session Check In - 03/02/23 0756       Check-In   Supervising physician immediately available to respond to emergencies See telemetry face sheet for immediately available ER MD    Location [facility]-Cardiac & Pulmonary Rehab    Staff Present Harlene Ramus, RN, BSN;Joseph Hood, RCP,RRT,BSRT;Noah Tickle, [place], Exercise Physiologist    Virtual Visit No    Medication changes reported     No    Fall or balance concerns reported    No    Tobacco Cessation No Change    Warm-up and Cool-down Performed on first and last piece of equipment    Resistance Training Performed Yes    VAD Patient? No    PAD/SET Patient? No      Pain Assessment   Currently in Pain? No/denies                Social History   Tobacco Use  Smoking Status Never  Smokeless Tobacco Former   Types: Chew   Quit date: 1999    Goals Met:  Proper associated with RPD/PD & O2 Sat Independence with exercise equipment Exercise tolerated well No report of concerns or symptoms today Strength training completed today  Goals Unmet:  Not Applicable  Comments: Pt able to follow exercise prescription today without complaint.  Will continue to monitor for progression.   Dr. Bethann Punches is Medical Director for [facility] Cardiac Rehabilitation.  Dr. Vida Rigger is Medical Director for [facility] Pulmonary Rehabilitation.

## 2023-03-05 ENCOUNTER — Encounter: Payer: BC Managed Care – PPO | Admitting: *Deleted

## 2023-03-05 DIAGNOSIS — I213 ST elevation (STEMI) myocardial infarction of unspecified site: Secondary | ICD-10-CM

## 2023-03-05 DIAGNOSIS — Z48812 Encounter for surgical aftercare following surgery on the circulatory system: Secondary | ICD-10-CM | POA: Diagnosis not present

## 2023-03-05 NOTE — Progress Notes (Signed)
Daily Session Note  Patient Details  Name: Keith Torres. MRN: [id number] Date of Birth: 1965-10-25 Referring Provider:   Flowsheet Row Cardiac Rehab from 02/26/2023 in [facility] Cardiac and Pulmonary Rehab  Referring Provider Dr. Clotilde Dieter, DO       Encounter Date: 03/05/2023  Check In:  Session Check In - 03/05/23 0807       Check-In   Supervising physician immediately available to respond to emergencies See telemetry face sheet for immediately available ER MD    Location [facility]-Cardiac & Pulmonary Rehab    Staff Present Elige Ko, Algernon Huxley, BS, ACSM CEP, Exercise Physiologist;George Alcantar Katrinka Blazing, RN, ADN    Virtual Visit No    Medication changes reported     No    Fall or balance concerns reported    No    Warm-up and Cool-down Performed on first and last piece of equipment    Resistance Training Performed Yes    VAD Patient? No    PAD/SET Patient? No      Pain Assessment   Currently in Pain? No/denies                Social History   Tobacco Use  Smoking Status Never  Smokeless Tobacco Former   Types: Chew   Quit date: 1999    Goals Met:  Independence with exercise equipment Exercise tolerated well No report of concerns or symptoms today Strength training completed today  Goals Unmet:  Not Applicable  Comments: Pt able to follow exercise prescription today without complaint.  Will continue to monitor for progression.    Dr. Bethann Punches is Medical Director for [facility] Cardiac Rehabilitation.  Dr. Vida Rigger is Medical Director for [facility] Pulmonary Rehabilitation.

## 2023-03-07 ENCOUNTER — Telehealth: Payer: Self-pay

## 2023-03-07 ENCOUNTER — Encounter: Payer: Self-pay | Admitting: Urology

## 2023-03-07 ENCOUNTER — Ambulatory Visit: Payer: BC Managed Care – PPO | Admitting: Urology

## 2023-03-07 ENCOUNTER — Encounter: Payer: Self-pay | Admitting: *Deleted

## 2023-03-07 VITALS — BP 130/80 | HR 74 | Ht 74.0 in | Wt 231.0 lb

## 2023-03-07 DIAGNOSIS — Z955 Presence of coronary angioplasty implant and graft: Secondary | ICD-10-CM

## 2023-03-07 DIAGNOSIS — R972 Elevated prostate specific antigen [PSA]: Secondary | ICD-10-CM

## 2023-03-07 DIAGNOSIS — I213 ST elevation (STEMI) myocardial infarction of unspecified site: Secondary | ICD-10-CM

## 2023-03-07 LAB — URINALYSIS, COMPLETE
Bilirubin, UA: NEGATIVE
Ketones, UA: NEGATIVE
Leukocytes,UA: NEGATIVE
Nitrite, UA: NEGATIVE
Protein,UA: NEGATIVE
RBC, UA: NEGATIVE
Specific Gravity, UA: 1.01 (ref 1.005–1.030)
Urobilinogen, Ur: 0.2 mg/dL (ref 0.2–1.0)
pH, UA: 5.5 (ref 5.0–7.5)

## 2023-03-07 LAB — MICROSCOPIC EXAMINATION

## 2023-03-07 MED ORDER — DIAZEPAM 10 MG PO TABS: ORAL_TABLET | ORAL | 0 refills | Status: DC

## 2023-03-07 NOTE — Progress Notes (Signed)
I, Keith Torres, acting as a scribe for Keith Altes, MD., have documented all relevant documentation on the behalf of Keith Altes, MD, as directed by Keith Altes, MD while in the presence of Keith Altes, MD.  03/07/2023 12:25 PM   [name]. 06-07-1965 [id number]  Referring provider: Dione Housekeeper, MD [address] [place],  [place] [address]  Chief Complaint  Patient presents with   Elevated PSA    HPI: Keith Torres. is a 57 y.o. male referred for evaluation of an elevated PSA.  PSA drawn 01/25/23 was elevated at 5.06; prior PSA January 2022 was 1.57 Prostate Health Index was drawn 02/02/2023 and PSA was 4.72. His PHI was 36.8 indicating a 33.3% probability of prostate cancer  He has no bothersome lower urinary tract symptoms Denies disuria, gross hematuria.  No flank, abdominal, or pelvic pain.  No family history of prostate cancer.  Denies prior urologic problems.  Admitted to [facility] August 2024 for STEMI and is currently on Plavix and Eliquis. He did not have a Foley catheter during that hospitalization.   PMH: Past Medical History:  Diagnosis Date   Anxiety    Atrial fibrillation (HCC)    Dental crowns present    FRONT TWO TEETH   Deviated septum    Dysrhythmia    A-FIB/ DR Gwen Pounds   Hyperlipidemia    Hypertrophy of nasal turbinates    Meningitis    Obesity    Rhinitis, allergic    Seasonal allergies    Sleep apnea    C-PAP    Surgical History: Past Surgical History:  Procedure Laterality Date   APPENDECTOMY  06/05/1972   BACK SURGERY  06/05/2013   COLONOSCOPY     COLONOSCOPY WITH PROPOFOL N/A 10/08/2015   Procedure: COLONOSCOPY WITH PROPOFOL;  Surgeon: Scot Jun, MD;  Location: [facility] ENDOSCOPY;  Service: Endoscopy;  Laterality: N/A;   CORONARY ANGIOPLASTY WITH STENT PLACEMENT     CORONARY/GRAFT ACUTE MI REVASCULARIZATION N/A 01/12/2023   Procedure: Coronary/Graft Acute MI Revascularization;  Surgeon: Yvonne Kendall, MD;  Location: [facility] INVASIVE CV LAB;  Service: Cardiovascular;  Laterality: N/A;   HERNIA REPAIR Right 01/17/2001   Indirect hernia, Prolene hernia system.   HERNIA REPAIR Left 03/23/2014   Left indirect inguinal hernia repair, large ultra Pro mesh   LEFT HEART CATH AND CORONARY ANGIOGRAPHY N/A 01/12/2023   Procedure: LEFT HEART CATH AND CORONARY ANGIOGRAPHY;  Surgeon: Yvonne Kendall, MD;  Location: [facility] INVASIVE CV LAB;  Service: Cardiovascular;  Laterality: N/A;   NASAL TURBINATE REDUCTION Bilateral 05/21/2015   Procedure: TURBINATE REDUCTION/SUBMUCOSAL RESECTION;  Surgeon: Linus Salmons, MD;  Location: [facility]GERY [facility];  Service: ENT;  Laterality: Bilateral;   SEPTOPLASTY N/A 05/21/2015   Procedure: SEPTOPLASTY;  Surgeon: Linus Salmons, MD;  Location: [facility] SURGERY [facility];  Service: ENT;  Laterality: N/A;  C-PAP    Home Medications:  Allergies as of 03/07/2023       Reactions   Azithromycin Other (See Comments)   Drug interacts with current daily medication.        Medication List        Accurate as of March 07, 2023 12:25 PM. If you have any questions, ask your nurse or doctor.          STOP taking these medications    amiodarone 200 MG tablet Commonly known as: PACERONE Stopped by: Keith Torres   atorvastatin 40 MG tablet Commonly known as: LIPITOR  Stopped by: Keith Torres       TAKE these medications    apixaban 5 MG Tabs tablet Commonly known as: ELIQUIS Take 1 tablet (5 mg total) by mouth 2 (two) times daily.   benzonatate 200 MG capsule Commonly known as: TESSALON Take 1 capsule (200 mg total) by mouth 3 (three) times daily as needed for cough.   clopidogrel 75 MG tablet Commonly known as: PLAVIX Take 1 tablet (75 mg total) by mouth daily.   diazepam 10 MG tablet Commonly known as: VALIUM 1 tab po 30 min prior to procedure.  Will need a driver to procedure with this medication Started by: Keith Torres    empagliflozin 10 MG Tabs tablet Commonly known as: JARDIANCE Take 1 tablet by mouth daily.   furosemide 20 MG tablet Commonly known as: LASIX Take 1 tablet (20 mg total) by mouth daily.   meloxicam 7.5 MG tablet Commonly known as: MOBIC Take 1 tablet by mouth daily.   metoprolol tartrate 50 MG tablet Commonly known as: LOPRESSOR Take 1 tablet (50 mg total) by mouth 2 (two) times daily.   metoprolol tartrate 50 MG tablet Commonly known as: LOPRESSOR Take 1 tablet by mouth 3 (three) times daily.   simethicone 80 MG chewable tablet Commonly known as: MYLICON Chew 1 tablet (80 mg total) by mouth 4 (four) times daily.   simethicone 80 MG chewable tablet Commonly known as: MYLICON Chew by mouth.   Spacer/Aero Chamber Mouthpiece Misc One spacer to use with albuterol inhaler.        Allergies:  Allergies  Allergen Reactions   Azithromycin Other (See Comments)    Drug interacts with current daily medication.    Family History: Family History  Problem Relation Age of Onset   Cancer Mother        colon   Heart disease Father    Hyperlipidemia Father    CAD Father     Social History:  reports that he has never smoked. He quit smokeless tobacco use about 25 years ago.  His smokeless tobacco use included chew. He reports that he does not currently use alcohol. He reports that he does not use drugs.   Physical Exam: BP 130/80   Pulse 74   Ht 6\' 2"  (1.88 m)   Wt 231 lb (104.8 kg)   BMI 29.66 kg/m   Constitutional:  Alert and oriented, No acute distress. HEENT: [employer] AT Respiratory: Normal respiratory effort, no increased work of breathing. GU: Prostate 45 grams, smooth without nodules. Psychiatric: Normal mood and affect.   Urinalysis Dipstick/microscopy negative.   Assessment & Plan:    1. Elevated PSA Although PSA is a prostate cancer screening test he was informed that cancer is not the most common cause of an elevated PSA. Other potential causes  including BPH and inflammation were discussed. He was informed that the only way to adequately diagnose prostate cancer would be a transrectal ultrasound and biopsy of the prostate. The procedure was discussed including potential risks of bleeding and infection/sepsis. He was also informed that a negative biopsy does not conclusively rule out the possibility that prostate cancer may be present and that continued monitoring is required. The use of newer adjunctive blood tests including PHI and 4kScore were discussed. The use of multiparametric prostate MRI to evaluate for lesions suspicious for high grade prostate cancer and aid in targeted bx was reviewed. Continued periodic surveillance was also discussed. Recent STEMI and unlikely he would be able to come  off Eliquis and Plavix at this point for a prostate biopsy.  Recommend scheduling prostate MRI. He does have slight claustrophobic and Rx Valium sent to pharmacy. He was informed he would need a driver with utilizing this medication.   I have reviewed the above documentation for accuracy and completeness, and I agree with the above.   Keith Altes, MD  [facility] [address] [place], [place] [address] [phone]

## 2023-03-07 NOTE — Progress Notes (Signed)
Cardiac Individual Treatment Plan  Patient Details  Name: Keith Torres. MRN: [id number] Date of Birth: 1966-02-24 Referring Provider:   Flowsheet Row Cardiac Rehab from 02/26/2023 in [facility] Cardiac and Pulmonary Rehab  Referring Provider Dr. Clotilde Dieter, DO       Initial Encounter Date:  Flowsheet Row Cardiac Rehab from 02/26/2023 in [facility] Cardiac and Pulmonary Rehab  Date 02/26/23       Visit Diagnosis: ST elevation myocardial infarction (STEMI), unspecified artery [facility])  Status post coronary artery stent placement  Patient's Home Medications on Admission:  Current Outpatient Medications:    apixaban (ELIQUIS) 5 MG TABS tablet, Take 1 tablet (5 mg total) by mouth 2 (two) times daily., Disp: 60 tablet, Rfl: 1   benzonatate (TESSALON) 200 MG capsule, Take 1 capsule (200 mg total) by mouth 3 (three) times daily as needed for cough., Disp: 30 capsule, Rfl: 0   clopidogrel (PLAVIX) 75 MG tablet, Take 1 tablet (75 mg total) by mouth daily., Disp: 30 tablet, Rfl: 1   diazepam (VALIUM) 10 MG tablet, 1 tab po 30 min prior to procedure.  Will need a driver to procedure with this medication, Disp: 1 tablet, Rfl: 0   empagliflozin (JARDIANCE) 10 MG TABS tablet, Take 1 tablet by mouth daily., Disp: , Rfl:    furosemide (LASIX) 20 MG tablet, Take 1 tablet (20 mg total) by mouth daily., Disp: 30 tablet, Rfl: 1   meloxicam (MOBIC) 7.5 MG tablet, Take 1 tablet by mouth daily., Disp: , Rfl:    metoprolol tartrate (LOPRESSOR) 50 MG tablet, Take 1 tablet (50 mg total) by mouth 2 (two) times daily., Disp: 60 tablet, Rfl: 1   metoprolol tartrate (LOPRESSOR) 50 MG tablet, Take 1 tablet by mouth 3 (three) times daily., Disp: , Rfl:    simethicone (MYLICON) 80 MG chewable tablet, Chew 1 tablet (80 mg total) by mouth 4 (four) times daily., Disp: 30 tablet, Rfl: 0   simethicone (MYLICON) 80 MG chewable tablet, Chew by mouth., Disp: , Rfl:    Spacer/Aero Chamber Mouthpiece MISC, One spacer to use with  albuterol inhaler., Disp: 1 each, Rfl: 0  Past Medical History: Past Medical History:  Diagnosis Date   Anxiety    Atrial fibrillation (HCC)    Dental crowns present    FRONT TWO TEETH   Deviated septum    Dysrhythmia    A-FIB/ DR Gwen Pounds   Hyperlipidemia    Hypertrophy of nasal turbinates    Meningitis    Obesity    Rhinitis, allergic    Seasonal allergies    Sleep apnea    C-PAP    Tobacco Use: Social History   Tobacco Use  Smoking Status Never  Smokeless Tobacco Former   Types: Chew   Quit date: 1999    Labs: Review Flowsheet       Latest Ref Rng & Units 04/10/2017 01/12/2023 01/13/2023  Labs for ITP Cardiac and Pulmonary Rehab  Cholestrol 0 - 200 mg/dL 409  811  914   LDL (calc) 0 - 99 mg/dL 782  956  77   HDL-C >21 mg/dL 52  51  54   Trlycerides <150 mg/dL 308  657  90   Hemoglobin A1c 4.8 - 5.6 % - - 5.4     Details             Exercise Target Goals: Exercise Program Goal: Individual exercise prescription set using results from initial 6 min walk test and THRR while considering  patient's  activity barriers and safety.   Exercise Prescription Goal: Initial exercise prescription builds to 30-45 minutes a day of aerobic activity, 2-3 days per week.  Home exercise guidelines will be given to patient during program as part of exercise prescription that the participant will acknowledge.   Education: Aerobic Exercise: - Group verbal and visual presentation on the components of exercise prescription. Introduces F.I.T.T principle from ACSM for exercise prescriptions.  Reviews F.I.T.T. principles of aerobic exercise including progression. Written material given at graduation. Flowsheet Row Cardiac Rehab from 02/26/2023 in [facility] Cardiac and Pulmonary Rehab  Education need identified 02/26/23       Education: Resistance Exercise: - Group verbal and visual presentation on the components of exercise prescription. Introduces F.I.T.T principle from ACSM for  exercise prescriptions  Reviews F.I.T.T. principles of resistance exercise including progression. Written material given at graduation.    Education: Exercise & Equipment Safety: - Individual verbal instruction and demonstration of equipment use and safety with use of the equipment. Flowsheet Row Cardiac Rehab from 02/26/2023 in [facility] Cardiac and Pulmonary Rehab  Date 02/07/23  Educator [facility]  Instruction Review Code 1- Verbalizes Understanding       Education: Exercise Physiology & General Exercise Guidelines: - Group verbal and written instruction with models to review the exercise physiology of the cardiovascular system and associated critical values. Provides general exercise guidelines with specific guidelines to those with heart or lung disease.    Education: Flexibility, Balance, Mind/Body Relaxation: - Group verbal and visual presentation with interactive activity on the components of exercise prescription. Introduces F.I.T.T principle from ACSM for exercise prescriptions. Reviews F.I.T.T. principles of flexibility and balance exercise training including progression. Also discusses the mind body connection.  Reviews various relaxation techniques to help reduce and manage stress (i.e. Deep breathing, progressive muscle relaxation, and visualization). Balance handout provided to take home. Written material given at graduation.   Activity Barriers & Risk Stratification:  Activity Barriers & Cardiac Risk Stratification - 02/26/23 1139       Activity Barriers & Cardiac Risk Stratification   Activity Barriers Other (comment)    Comments Osteoarthritis knee, Gout    Cardiac Risk Stratification Moderate             6 Minute Walk:  6 Minute Walk     Row Name 02/26/23 1136         6 Minute Walk   Phase Initial     Distance 1010 feet     Walk Time 6 minutes     # of Rest Breaks 0     MPH 1.91     METS 3.07     RPE 13     Perceived Dyspnea  2     VO2 Peak 10.75      Symptoms No     Resting HR 108 bpm     Resting BP 94/60     Resting Oxygen Saturation  98 %     Exercise Oxygen Saturation  during 6 min walk 96 %     Max Ex. HR 133 bpm     Max Ex. BP 108/66     2 Minute Post BP 98/64              Oxygen Initial Assessment:   Oxygen Re-Evaluation:   Oxygen Discharge (Final Oxygen Re-Evaluation):   Initial Exercise Prescription:  Initial Exercise Prescription - 02/26/23 1100       Date of Initial Exercise RX and Referring Provider   Date 02/26/23  Referring Provider Dr. Clotilde Dieter, DO      Oxygen   Maintain Oxygen Saturation 88% or higher      Treadmill   MPH 1.8    Grade 0    Minutes 15    METs 2.38      Recumbant Bike   Level 2    RPM 50    Watts 36    Minutes 15    METs 3.07      NuStep   Level 2    SPM 80    Minutes 15    METs 3.07      REL-XR   Level 2    Speed 50    Minutes 15    METs 3.07      Track   Laps 30    Minutes 15    METs 3.07      Prescription Details   Frequency (times per week) 3    Duration Progress to 30 minutes of continuous aerobic without signs/symptoms of physical distress      Intensity   THRR 40-80% of Max Heartrate 129-151    Ratings of Perceived Exertion 11-13    Perceived Dyspnea 0-4      Resistance Training   Training Prescription Yes    Weight 5 lb    Reps 10-15             Perform Capillary Blood Glucose checks as needed.  Exercise Prescription Changes:   Exercise Prescription Changes     Row Name 02/26/23 1100             Response to Exercise   Blood Pressure (Admit) 94/60       Blood Pressure (Exercise) 108/66       Blood Pressure (Exit) 98/64       Heart Rate (Admit) 108 bpm       Heart Rate (Exercise) 133 bpm       Heart Rate (Exit) 122 bpm       Oxygen Saturation (Admit) 98 %       Oxygen Saturation (Exercise) 96 %       Rating of Perceived Exertion (Exercise) 13       Perceived Dyspnea (Exercise) 2       Symptoms none        Comments Results                Exercise Comments:   Exercise Comments     Row Name 02/28/23 0756           Exercise Comments First full day of exercise!  Patient was oriented to gym and equipment including functions, settings, policies, and procedures.  Patient's individual exercise prescription and treatment plan were reviewed.  All starting workloads were established based on the results of the 6 minute walk test done at initial orientation visit.  The plan for exercise progression was also introduced and progression will be customized based on patient's performance and goals.                Exercise Goals and Review:   Exercise Goals     Row Name 02/26/23 1139             Exercise Goals   Increase Physical Activity Yes       Intervention Provide advice, education, support and counseling about physical activity/exercise needs.;Develop an individualized exercise prescription for aerobic and resistive training based on initial evaluation findings, risk stratification, comorbidities and participant's personal goals.  Expected Outcomes Short Term: Attend rehab on a regular basis to increase amount of physical activity.;Long Term: Add in home exercise to make exercise part of routine and to increase amount of physical activity.;Long Term: Exercising regularly at least 3-5 days a week.       Increase Strength and Stamina Yes       Intervention Provide advice, education, support and counseling about physical activity/exercise needs.;Develop an individualized exercise prescription for aerobic and resistive training based on initial evaluation findings, risk stratification, comorbidities and participant's personal goals.       Expected Outcomes Short Term: Increase workloads from initial exercise prescription for resistance, speed, and METs.;Short Term: Perform resistance training exercises routinely during rehab and add in resistance training at home;Long Term: Improve  cardiorespiratory fitness, muscular endurance and strength as measured by increased METs and functional capacity ( )       Able to understand and use rate of perceived exertion (RPE) scale Yes       Intervention Provide education and explanation on how to use RPE scale       Expected Outcomes Short Term: Able to use RPE daily in rehab to express subjective intensity level;Long Term:  Able to use RPE to guide intensity level when exercising independently       Able to understand and use Dyspnea scale Yes       Intervention Provide education and explanation on how to use Dyspnea scale       Expected Outcomes Short Term: Able to use Dyspnea scale daily in rehab to express subjective sense of shortness of breath during exertion;Long Term: Able to use Dyspnea scale to guide intensity level when exercising independently       Knowledge and understanding of Target Heart Rate Range (THRR) Yes       Intervention Provide education and explanation of THRR including how the numbers were predicted and where they are located for reference       Expected Outcomes Short Term: Able to state/look up THRR;Long Term: Able to use THRR to govern intensity when exercising independently;Short Term: Able to use daily as guideline for intensity in rehab       Able to check pulse independently Yes       Intervention Review the importance of being able to check your own pulse for safety during independent exercise;Provide education and demonstration on how to check pulse in carotid and radial arteries.       Expected Outcomes Short Term: Able to explain why pulse checking is important during independent exercise;Long Term: Able to check pulse independently and accurately       Understanding of Exercise Prescription Yes       Intervention Provide education, explanation, and written materials on patient's individual exercise prescription       Expected Outcomes Short Term: Able to explain program exercise prescription;Long  Term: Able to explain home exercise prescription to exercise independently                Exercise Goals Re-Evaluation :  Exercise Goals Re-Evaluation     Row Name 02/28/23 0756             Exercise Goal Re-Evaluation   Exercise Goals Review Able to understand and use rate of perceived exertion (RPE) scale;Able to understand and use Dyspnea scale;Knowledge and understanding of Target Heart Rate Range (THRR);Understanding of Exercise Prescription       Comments Reviewed RPE and dyspnea scale, THR and program prescription with pt today.  Pt  voiced understanding and was given a copy of goals to take home.       Expected Outcomes Short: Use RPE daily to regulate intensity. Long: Follow program prescription in THR.                Discharge Exercise Prescription (Final Exercise Prescription Changes):  Exercise Prescription Changes - 02/26/23 1100       Response to Exercise   Blood Pressure (Admit) 94/60    Blood Pressure (Exercise) 108/66    Blood Pressure (Exit) 98/64    Heart Rate (Admit) 108 bpm    Heart Rate (Exercise) 133 bpm    Heart Rate (Exit) 122 bpm    Oxygen Saturation (Admit) 98 %    Oxygen Saturation (Exercise) 96 %    Rating of Perceived Exertion (Exercise) 13    Perceived Dyspnea (Exercise) 2    Symptoms none    Comments Results             Nutrition:  Target Goals: Understanding of nutrition guidelines, daily intake of sodium 1500mg , cholesterol 200mg , calories 30% from fat and 7% or less from saturated fats, daily to have 5 or more servings of fruits and vegetables.  Education: All About Nutrition: -Group instruction provided by verbal, written material, interactive activities, discussions, models, and posters to present general guidelines for heart healthy nutrition including fat, fiber, MyPlate, the role of sodium in heart healthy nutrition, utilization of the nutrition label, and utilization of this knowledge for meal planning. Follow up  email sent as well. Written material given at graduation. Flowsheet Row Cardiac Rehab from 02/26/2023 in [facility] Cardiac and Pulmonary Rehab  Education need identified 02/26/23       Biometrics:  Pre Biometrics - 02/26/23 1140       Pre Biometrics   Height 6\' 2"  (1.88 m)    Weight 233 lb 8 oz (105.9 kg)    Waist Circumference 41.5 inches    Hip Circumference 43.5 inches    Waist to Hip Ratio 0.95 %    BMI (Calculated) 29.97    Single Leg Stand 10.1 seconds              Nutrition Therapy Plan and Nutrition Goals:  Nutrition Therapy & Goals - 02/26/23 1147       Intervention Plan   Intervention Prescribe, educate and counsel regarding individualized specific dietary modifications aiming towards targeted core components such as weight, hypertension, lipid management, diabetes, heart failure and other comorbidities.    Expected Outcomes Short Term Goal: Understand basic principles of dietary content, such as calories, fat, sodium, cholesterol and nutrients.;Short Term Goal: A plan has been developed with personal nutrition goals set during dietitian appointment.;Long Term Goal: Adherence to prescribed nutrition plan.             Nutrition Assessments:  MEDIFICTS Score Key: >=70 Need to make dietary changes  40-70 Heart Healthy Diet <= 40 Therapeutic Level Cholesterol Diet  Flowsheet Row Cardiac Rehab from 02/26/2023 in [facility] Cardiac and Pulmonary Rehab  Picture Your Plate Total Score on Admission 47      Picture Your Plate Scores: <82 Unhealthy dietary pattern with much room for improvement. 41-50 Dietary pattern unlikely to meet recommendations for good health and room for improvement. 51-60 More healthful dietary pattern, with some room for improvement.  >60 Healthy dietary pattern, although there may be some specific behaviors that could be improved.    Nutrition Goals Re-Evaluation:   Nutrition Goals Discharge (Final Nutrition Goals  Re-Evaluation):   Psychosocial: Target Goals: Acknowledge presence or absence of significant depression and/or stress, maximize coping skills, provide positive support system. Participant is able to verbalize types and ability to use techniques and skills needed for reducing stress and depression.   Education: Stress, Anxiety, and Depression - Group verbal and visual presentation to define topics covered.  Reviews how body is impacted by stress, anxiety, and depression.  Also discusses healthy ways to reduce stress and to treat/manage anxiety and depression.  Written material given at graduation. Flowsheet Row Cardiac Rehab from 02/26/2023 in [facility] Cardiac and Pulmonary Rehab  Education need identified 02/26/23       Education: Sleep Hygiene -Provides group verbal and written instruction about how sleep can affect your health.  Define sleep hygiene, discuss sleep cycles and impact of sleep habits. Review good sleep hygiene tips.    Initial Review & Psychosocial Screening:  Initial Psych Review & Screening - 02/07/23 0910       Initial Review   Current issues with None Identified      Family Dynamics   Good Support System? Yes    Comments He can look to his wife and  his son for support.  He has a good outlook on his health and is ready to start rehab .      Barriers   Psychosocial barriers to participate in program There are no identifiable barriers or psychosocial needs.;The patient should benefit from training in stress management and relaxation.      Screening Interventions   Interventions Encouraged to exercise;To provide support and resources with identified psychosocial needs;Provide feedback about the scores to participant    Expected Outcomes Short Term goal: Utilizing psychosocial counselor, staff and physician to assist with identification of specific Stressors or current issues interfering with healing process. Setting desired goal for each stressor or current issue  identified.;Long Term Goal: Stressors or current issues are controlled or eliminated.;Long Term goal: The participant improves quality of Life and PHQ9 Scores as seen by post scores and/or verbalization of changes;Short Term goal: Identification and review with participant of any Quality of Life or Depression concerns found by scoring the questionnaire.             Quality of Life Scores:   Quality of Life - 02/26/23 1146       Quality of Life   Select Quality of Life      Quality of Life Scores   Health/Function Pre 18.9 %    Socioeconomic Pre 30 %    Psych/Spiritual Pre 28.07 %    Family Pre 27.6 %    GLOBAL Pre 24.18 %            Scores of 19 and below usually indicate a poorer quality of life in these areas.  A difference of  2-3 points is a clinically meaningful difference.  A difference of 2-3 points in the total score of the Quality of Life Index has been associated with significant improvement in overall quality of life, self-image, physical symptoms, and general health in studies assessing change in quality of life.  PHQ-9: Review Flowsheet       02/26/2023  Depression screen PHQ 2/9  Decreased Interest 0  Down, Depressed, Hopeless 0  PHQ - 2 Score 0  Altered sleeping 1  Tired, decreased energy 3  Change in appetite 0  Feeling bad or failure about yourself  0  Trouble concentrating 0  Moving slowly or fidgety/restless 1  Suicidal thoughts 0  PHQ-9 Score 5  Difficult doing work/chores Very difficult    Details           Interpretation of Total Score  Total Score Depression Severity:  1-4 = Minimal depression, 5-9 = Mild depression, 10-14 = Moderate depression, 15-19 = Moderately severe depression, 20-27 = Severe depression   Psychosocial Evaluation and Intervention:  Psychosocial Evaluation - 02/07/23 0913       Psychosocial Evaluation & Interventions   Interventions Relaxation education;Stress management education;Encouraged to exercise with the  program and follow exercise prescription    Comments He can look to his wife and his son for support. He has a good outlook on his health and is ready to start rehab .    Expected Outcomes Short: Start HeartTrack to help with mood. Long: Maintain a healthy mental state    Continue Psychosocial Services  Follow up required by staff             Psychosocial Re-Evaluation:   Psychosocial Discharge (Final Psychosocial Re-Evaluation):   Vocational Rehabilitation: Provide vocational rehab assistance to qualifying candidates.   Vocational Rehab Evaluation & Intervention:   Education: Education Goals: Education classes will be provided on a variety of topics geared toward better understanding of heart health and risk factor modification. Participant will state understanding/return demonstration of topics presented as noted by education test scores.  Learning Barriers/Preferences:  Learning Barriers/Preferences - 02/07/23 1610       Learning Barriers/Preferences   Learning Barriers None    Learning Preferences None             General Cardiac Education Topics:  AED/CPR: - Group verbal and written instruction with the use of models to demonstrate the basic use of the AED with the basic ABC's of resuscitation.   Anatomy and Cardiac Procedures: - Group verbal and visual presentation and models provide information about basic cardiac anatomy and function. Reviews the testing methods done to diagnose heart disease and the outcomes of the test results. Describes the treatment choices: Medical Management, Angioplasty, or Coronary Bypass Surgery for treating various heart conditions including Myocardial Infarction, Angina, Valve Disease, and Cardiac Arrhythmias.  Written material given at graduation. Flowsheet Row Cardiac Rehab from 02/26/2023 in [facility] Cardiac and Pulmonary Rehab  Education need identified 02/26/23       Medication Safety: - Group verbal and visual instruction to  review commonly prescribed medications for heart and lung disease. Reviews the medication, class of the drug, and side effects. Includes the steps to properly store meds and maintain the prescription regimen.  Written material given at graduation.   Intimacy: - Group verbal instruction through game format to discuss how heart and lung disease can affect sexual intimacy. Written material given at graduation..   Know Your Numbers and Heart Failure: - Group verbal and visual instruction to discuss disease risk factors for cardiac and pulmonary disease and treatment options.  Reviews associated critical values for Overweight/Obesity, Hypertension, Cholesterol, and Diabetes.  Discusses basics of heart failure: signs/symptoms and treatments.  Introduces Heart Failure Zone chart for action plan for heart failure.  Written material given at graduation.   Infection Prevention: - Provides verbal and written material to individual with discussion of infection control including proper hand washing and proper equipment cleaning during exercise session. Flowsheet Row Cardiac Rehab from 02/26/2023 in [facility] Cardiac and Pulmonary Rehab  Date 02/07/23  Educator [facility]  Instruction Review Code 1- Verbalizes Understanding       Falls Prevention: - Provides verbal and written material  to individual with discussion of falls prevention and safety. Flowsheet Row Cardiac Rehab from 02/26/2023 in [facility] Cardiac and Pulmonary Rehab  Date 02/07/23  Educator [facility]  Instruction Review Code 1- Verbalizes Understanding       Other: -Provides group and verbal instruction on various topics (see comments)   Knowledge Questionnaire Score:  Knowledge Questionnaire Score - 02/26/23 1147       Knowledge Questionnaire Score   Pre Score 17/26             Core Components/Risk Factors/Patient Goals at Admission:  Personal Goals and Risk Factors at Admission - 02/07/23 0909       Core Components/Risk Factors/Patient  Goals on Admission    Weight Management Yes;Weight Maintenance    Intervention Weight Management: Develop a combined nutrition and exercise program designed to reach desired caloric intake, while maintaining appropriate intake of nutrient and fiber, sodium and fats, and appropriate energy expenditure required for the weight goal.;Weight Management: Provide education and appropriate resources to help participant work on and attain dietary goals.;Weight Management/Obesity: Establish reasonable short term and long term weight goals.    Expected Outcomes Short Term: Continue to assess and modify interventions until short term weight is achieved;Long Term: Adherence to nutrition and physical activity/exercise program aimed toward attainment of established weight goal;Weight Maintenance: Understanding of the daily nutrition guidelines, which includes 25-35% calories from fat, 7% or less cal from saturated fats, less than 200mg  cholesterol, less than 1.5gm of sodium, & 5 or more servings of fruits and vegetables daily;Understanding recommendations for meals to include 15-35% energy as protein, 25-35% energy from fat, 35-60% energy from carbohydrates, less than 200mg  of dietary cholesterol, 20-35 gm of total fiber daily;Understanding of distribution of calorie intake throughout the day with the consumption of 4-5 meals/snacks    Hypertension Yes    Intervention Provide education on lifestyle modifcations including regular physical activity/exercise, weight management, moderate sodium restriction and increased consumption of fresh fruit, vegetables, and low fat dairy, alcohol moderation, and smoking cessation.;Monitor prescription use compliance.    Expected Outcomes Short Term: Continued assessment and intervention until BP is < 140/77mm HG in hypertensive participants. < 130/69mm HG in hypertensive participants with diabetes, heart failure or chronic kidney disease.;Long Term: Maintenance of blood pressure at goal  levels.    Lipids Yes    Intervention Provide education and support for participant on nutrition & aerobic/resistive exercise along with prescribed medications to achieve LDL 70mg , HDL >40mg .    Expected Outcomes Short Term: Participant states understanding of desired cholesterol values and is compliant with medications prescribed. Participant is following exercise prescription and nutrition guidelines.;Long Term: Cholesterol controlled with medications as prescribed, with individualized exercise RX and with personalized nutrition plan. Value goals: LDL < 70mg , HDL > 40 mg.             Education:Diabetes - Individual verbal and written instruction to review signs/symptoms of diabetes, desired ranges of glucose level fasting, after meals and with exercise. Acknowledge that pre and post exercise glucose checks will be done for 3 sessions at entry of program.   Core Components/Risk Factors/Patient Goals Review:    Core Components/Risk Factors/Patient Goals at Discharge (Final Review):    ITP Comments:  ITP Comments     Row Name 02/07/23 0908 02/26/23 1135 02/28/23 0755 03/07/23 1208     ITP Comments Virtual Visit completed. Patient informed on EP and RD appointment and 6 Minute walk test. Patient also informed of patient health questionnaires on My Chart. Patient [employer]  understanding. Visit diagnosis can be found in [facility] 01/12/2023. Completed and gym orientation. Initial ITP created and sent for review to Dr. Bethann Punches, Medical Director. First full day of exercise!  Patient was oriented to gym and equipment including functions, settings, policies, and procedures.  Patient's individual exercise prescription and treatment plan were reviewed.  All starting workloads were established based on the results of the 6 minute walk test done at initial orientation visit.  The plan for exercise progression was also introduced and progression will be customized based on patient's performance and  goals. 30 Day review completed. Medical Director ITP review done, changes made as directed, and signed approval by Medical Director.    new to program             Comments:

## 2023-03-07 NOTE — Telephone Encounter (Signed)
Pt states he was seen this a.m.   SCS rxed Valium. It was sent to the incorrect pharmacy- [facility] -  Should have sent to [facility] on [address].   Thanks.

## 2023-03-07 NOTE — Patient Instructions (Signed)
Prostate MRI Prep:  1- No ejaculation 48 hours prior to exam  2- No caffeine or carbonated beverages on day of the exam  3- Eat light diet evening prior and day of exam  4- Avoid eating 4 hours prior to exam  5- Fleets enema needs to be done 4 hours prior to exam -See below. Can be purchased at the drug store.

## 2023-03-08 MED ORDER — DIAZEPAM 10 MG PO TABS: ORAL_TABLET | ORAL | 0 refills | Status: DC

## 2023-03-08 NOTE — Telephone Encounter (Signed)
Pt's wife aware.

## 2023-03-12 ENCOUNTER — Encounter: Payer: BC Managed Care – PPO | Attending: Internal Medicine | Admitting: *Deleted

## 2023-03-12 DIAGNOSIS — Z48812 Encounter for surgical aftercare following surgery on the circulatory system: Secondary | ICD-10-CM | POA: Insufficient documentation

## 2023-03-12 DIAGNOSIS — I213 ST elevation (STEMI) myocardial infarction of unspecified site: Secondary | ICD-10-CM | POA: Diagnosis present

## 2023-03-12 DIAGNOSIS — I252 Old myocardial infarction: Secondary | ICD-10-CM | POA: Insufficient documentation

## 2023-03-12 DIAGNOSIS — Z955 Presence of coronary angioplasty implant and graft: Secondary | ICD-10-CM | POA: Insufficient documentation

## 2023-03-12 NOTE — Progress Notes (Signed)
Daily Session Note  Patient Details  Name: Keith Torres. MRN: [id number] Date of Birth: 06-19-1965 Referring Provider:   Flowsheet Row Cardiac Rehab from 02/26/2023 in [facility] Cardiac and Pulmonary Rehab  Referring Provider Dr. Clotilde Dieter, DO       Encounter Date: 03/12/2023  Check In:  Session Check In - 03/12/23 0818       Check-In   Supervising physician immediately available to respond to emergencies See telemetry face sheet for immediately available ER MD    Location [facility]-Cardiac & Pulmonary Rehab    Staff Present Ronette Deter, BS, Exercise Physiologist;Joseph [place], RCP,RRT,BSRT;Kelly [place], BS, ACSM CEP, Exercise Physiologist;Lisel Siegrist Katrinka Blazing, RN, ADN    Virtual Visit No    Medication changes reported     No    Fall or balance concerns reported    No    Warm-up and Cool-down Performed on first and last piece of equipment    Resistance Training Performed Yes    VAD Patient? No    PAD/SET Patient? No      Pain Assessment   Currently in Pain? No/denies                Social History   Tobacco Use  Smoking Status Never  Smokeless Tobacco Former   Types: Chew   Quit date: 1999    Goals Met:  Independence with exercise equipment Exercise tolerated well No report of concerns or symptoms today Strength training completed today  Goals Unmet:  Not Applicable  Comments: Pt able to follow exercise prescription today without complaint.  Will continue to monitor for progression.    Dr. Bethann Punches is Medical Director for [facility] Cardiac Rehabilitation.  Dr. Vida Rigger is Medical Director for [facility] Pulmonary Rehabilitation.

## 2023-03-14 ENCOUNTER — Encounter: Payer: BC Managed Care – PPO | Admitting: *Deleted

## 2023-03-14 DIAGNOSIS — Z48812 Encounter for surgical aftercare following surgery on the circulatory system: Secondary | ICD-10-CM | POA: Diagnosis not present

## 2023-03-14 DIAGNOSIS — Z955 Presence of coronary angioplasty implant and graft: Secondary | ICD-10-CM

## 2023-03-14 DIAGNOSIS — I213 ST elevation (STEMI) myocardial infarction of unspecified site: Secondary | ICD-10-CM

## 2023-03-14 MED ORDER — SODIUM CHLORIDE 0.9 % IV SOLN: INTRAVENOUS | Status: DC

## 2023-03-14 NOTE — Progress Notes (Signed)
Daily Session Note  Patient Details  Name: Keith Torres. MRN: [id number] Date of Birth: December 28, 1965 Referring Provider:   Flowsheet Row Cardiac Rehab from 02/26/2023 in [facility] Cardiac and Pulmonary Rehab  Referring Provider Dr. Clotilde Dieter, DO       Encounter Date: 03/14/2023  Check In:  Session Check In - 03/14/23 0745       Check-In   Supervising physician immediately available to respond to emergencies See telemetry face sheet for immediately available ER MD    Location [facility]-Cardiac & Pulmonary Rehab    Staff Present Rory Percy, MS, Exercise Physiologist;Joseph Reino Kent, RCP,RRT,BSRT;Maxon [place] BS, , Exercise Physiologist;Holman Bonsignore Katrinka Blazing, RN, ADN    Virtual Visit No    Medication changes reported     No    Fall or balance concerns reported    No    Warm-up and Cool-down Performed on first and last piece of equipment    Resistance Training Performed Yes    VAD Patient? No    PAD/SET Patient? No      Pain Assessment   Currently in Pain? No/denies                Social History   Tobacco Use  Smoking Status Never  Smokeless Tobacco Former   Types: Chew   Quit date: 1999    Goals Met:  Independence with exercise equipment Exercise tolerated well No report of concerns or symptoms today Strength training completed today  Goals Unmet:  Not Applicable  Comments: Pt able to follow exercise prescription today without complaint.  Will continue to monitor for progression.    Dr. Bethann Punches is Medical Director for [facility] Cardiac Rehabilitation.  Dr. Vida Rigger is Medical Director for [facility] Pulmonary Rehabilitation.

## 2023-03-15 ENCOUNTER — Ambulatory Visit: Payer: BC Managed Care – PPO | Admitting: Certified Registered Nurse Anesthetist

## 2023-03-15 ENCOUNTER — Encounter: Payer: Self-pay | Admitting: Internal Medicine

## 2023-03-15 ENCOUNTER — Other Ambulatory Visit: Payer: Self-pay

## 2023-03-15 ENCOUNTER — Encounter
Admission: RE | Disposition: A | Discharge status: Home / Self Care | Payer: Self-pay | Source: Home / Self Care | Attending: Internal Medicine

## 2023-03-15 ENCOUNTER — Ambulatory Visit
Admission: RE | Admit: 2023-03-15 | Discharge: 2023-03-15 | Disposition: A | Discharge status: Home / Self Care | Payer: BC Managed Care – PPO | Attending: Internal Medicine | Admitting: Internal Medicine

## 2023-03-15 ENCOUNTER — Ambulatory Visit: Admission: RE | Admit: 2023-03-15 | Payer: BC Managed Care – PPO | Source: Ambulatory Visit

## 2023-03-15 DIAGNOSIS — E785 Hyperlipidemia, unspecified: Secondary | ICD-10-CM | POA: Diagnosis not present

## 2023-03-15 DIAGNOSIS — I480 Paroxysmal atrial fibrillation: Secondary | ICD-10-CM | POA: Diagnosis present

## 2023-03-15 DIAGNOSIS — Z79899 Other long term (current) drug therapy: Secondary | ICD-10-CM | POA: Insufficient documentation

## 2023-03-15 DIAGNOSIS — G4730 Sleep apnea, unspecified: Secondary | ICD-10-CM | POA: Diagnosis not present

## 2023-03-15 DIAGNOSIS — Z7901 Long term (current) use of anticoagulants: Secondary | ICD-10-CM | POA: Diagnosis not present

## 2023-03-15 DIAGNOSIS — I2510 Atherosclerotic heart disease of native coronary artery without angina pectoris: Secondary | ICD-10-CM | POA: Insufficient documentation

## 2023-03-15 DIAGNOSIS — I340 Nonrheumatic mitral (valve) insufficiency: Secondary | ICD-10-CM | POA: Diagnosis not present

## 2023-03-15 DIAGNOSIS — I440 Atrioventricular block, first degree: Secondary | ICD-10-CM | POA: Diagnosis not present

## 2023-03-15 DIAGNOSIS — I4819 Other persistent atrial fibrillation: Secondary | ICD-10-CM

## 2023-03-15 DIAGNOSIS — G4733 Obstructive sleep apnea (adult) (pediatric): Secondary | ICD-10-CM | POA: Insufficient documentation

## 2023-03-15 HISTORY — PX: CARDIOVERSION: SHX1299

## 2023-03-15 SURGERY — CARDIOVERSION
Anesthesia: General

## 2023-03-15 MED ORDER — PROPOFOL 10 MG/ML IV BOLUS
INTRAVENOUS | Status: DC | PRN
  Administered 2023-03-15: 10 mg via INTRAVENOUS
  Administered 2023-03-15: 20 mg via INTRAVENOUS
  Administered 2023-03-15: 10 mg via INTRAVENOUS
  Administered 2023-03-15: 20 mg via INTRAVENOUS
  Administered 2023-03-15: 30 mg via INTRAVENOUS
  Administered 2023-03-15: 10 mg via INTRAVENOUS

## 2023-03-15 NOTE — Anesthesia Preprocedure Evaluation (Signed)
Anesthesia Evaluation  Patient identified by MRN, date of birth, ID band Patient awake    Reviewed: Allergy & Precautions, H&P , NPO status , Patient's Chart, lab work & pertinent test results, reviewed documented beta blocker date and time   Airway Mallampati: II   Neck ROM: full    Dental  (+) Poor Dentition   Pulmonary sleep apnea    Pulmonary exam normal        Cardiovascular Exercise Tolerance: Good + CAD and + Past MI  + dysrhythmias Atrial Fibrillation  Rhythm:regular Rate:Normal     Neuro/Psych   Anxiety     negative neurological ROS  negative psych ROS   GI/Hepatic negative GI ROS, Neg liver ROS,,,  Endo/Other  negative endocrine ROS    Renal/GU Renal disease  negative genitourinary   Musculoskeletal   Abdominal   Peds  Hematology negative hematology ROS (+)   Anesthesia Other Findings Past Medical History: No date: Anxiety No date: Atrial fibrillation (HCC) No date: Dental crowns present     Comment:  FRONT TWO TEETH No date: Deviated septum No date: Dysrhythmia     Comment:  A-FIB/ DR Gwen Pounds No date: Hyperlipidemia No date: Hypertrophy of nasal turbinates No date: Meningitis No date: Obesity No date: Rhinitis, allergic No date: Seasonal allergies No date: Sleep apnea     Comment:  C-PAP Past Surgical History: 06/05/1972: APPENDECTOMY 06/05/2013: BACK SURGERY No date: COLONOSCOPY 10/08/2015: COLONOSCOPY WITH PROPOFOL; N/A     Comment:  Procedure: COLONOSCOPY WITH PROPOFOL;  Surgeon: Scot Jun, MD;  Location: [facility] ENDOSCOPY;  Service:               Endoscopy;  Laterality: N/A; No date: CORONARY ANGIOPLASTY WITH STENT PLACEMENT 01/12/2023: CORONARY/GRAFT ACUTE MI REVASCULARIZATION; N/A     Comment:  Procedure: Coronary/Graft Acute MI Revascularization;                Surgeon: Yvonne Kendall, MD;  Location: [facility] INVASIVE               CV LAB;  Service: Cardiovascular;   Laterality: N/A; 01/17/2001: HERNIA REPAIR; Right     Comment:  Indirect hernia, Prolene hernia system. 03/23/2014: HERNIA REPAIR; Left     Comment:  Left indirect inguinal hernia repair, large ultra Pro               mesh 01/12/2023: LEFT HEART CATH AND CORONARY ANGIOGRAPHY; N/A     Comment:  Procedure: LEFT HEART CATH AND CORONARY ANGIOGRAPHY;                Surgeon: Yvonne Kendall, MD;  Location: [facility] INVASIVE               CV LAB;  Service: Cardiovascular;  Laterality: N/A; 05/21/2015: NASAL TURBINATE REDUCTION; Bilateral     Comment:  Procedure: TURBINATE REDUCTION/SUBMUCOSAL RESECTION;                Surgeon: Linus Salmons, MD;  Location: [facility] SURGERY               CNTR;  Service: ENT;  Laterality: Bilateral; 05/21/2015: SEPTOPLASTY; N/A     Comment:  Procedure: SEPTOPLASTY;  Surgeon: Linus Salmons, MD;                Location: [facility] SURGERY CNTR;  Service: ENT;                Laterality: N/A;  C-PAP BMI    Body Mass Index: 29.27 kg/m     Reproductive/Obstetrics negative OB ROS                             Anesthesia Physical Anesthesia Plan  ASA: 4  Anesthesia Plan: General   Post-op Pain Management:    Induction:   PONV Risk Score and Plan:   Airway Management Planned:   Additional Equipment:   Intra-op Plan:   Post-operative Plan:   Informed Consent: I have reviewed the patients History and Physical, chart, labs and discussed the procedure including the risks, benefits and alternatives for the proposed anesthesia with the patient or authorized representative who has indicated his/her understanding and acceptance.     Dental Advisory Given  Plan Discussed with: CRNA  Anesthesia Plan Comments:        Anesthesia Quick Evaluation

## 2023-03-15 NOTE — Anesthesia Postprocedure Evaluation (Signed)
Anesthesia Post Note  Patient: Keith Torres.  Procedure(s) Performed: CARDIOVERSION  Patient location during evaluation: PACU Anesthesia Type: General Level of consciousness: awake and alert Pain management: pain level controlled Vital Signs Assessment: post-procedure vital signs reviewed and stable Respiratory status: spontaneous breathing, nonlabored ventilation, respiratory function stable and patient connected to nasal cannula oxygen Cardiovascular status: blood pressure returned to baseline and stable Postop Assessment: no apparent nausea or vomiting Anesthetic complications: no   No notable events documented.   Last Vitals:  Vitals:   03/15/23 0815 03/15/23 0829  BP: (!) 88/75   Pulse: 85   Resp: 12   Temp:    SpO2: 96% 97%    Last Pain:  Vitals:   03/15/23 0829  TempSrc:   PainSc: 0-No pain                 Yevette Edwards

## 2023-03-15 NOTE — Transfer of Care (Signed)
Immediate Anesthesia Transfer of Care Note  Patient: Keith Torres.  Procedure(s) Performed: CARDIOVERSION  Patient Location: short stay cardiology  Anesthesia Type:General  Level of Consciousness: awake, alert , and drowsy  Airway & Oxygen Therapy: Patient Spontanous Breathing and Patient connected to nasal cannula oxygen  Post-op Assessment: Report given to RN and Post -op Vital signs reviewed and stable  Post vital signs: Reviewed and stable  Last Vitals:  Vitals Value Taken Time  BP 91/72 03/15/23 0752  Temp    Pulse 80 03/15/23 0752  Resp 21 03/15/23 0752  SpO2 98 % 03/15/23 0752  Vitals shown include unfiled device data.  Last Pain:  Vitals:   03/15/23 0657  TempSrc: Oral  PainSc: 0-No pain         Complications: No notable events documented.

## 2023-03-15 NOTE — CV Procedure (Signed)
Electrical Cardioversion Procedure Note   Procedure: Electrical Cardioversion Indications:  Atrial Fibrillation  Procedure Details Consent: Risks of procedure as well as the alternatives and risks of each were explained to the (patient/caregiver).  Consent for procedure obtained. Time Out: Verified patient identification, verified procedure, site/side was marked, verified correct patient position, special equipment/implants available, medications/allergies/relevent history reviewed, required imaging and test results available.  Performed  Patient placed on cardiac monitor, pulse oximetry, supplemental oxygen as necessary.  Sedation given:  Propofol as per anesthesia Pacer pads placed anterior and posterior chest.  Cardioverted 3 time(s).  Cardioverted at unsuccessful.  Evaluation Findings: Post procedure EKG shows: Atrial Fibrillation Complications: None Patient did tolerate procedure well.   Dorothyann Peng MD 03/15/2023  08:37

## 2023-03-15 NOTE — Anesthesia Procedure Notes (Signed)
Date/Time: 03/15/2023 7:23 AM  Performed by: Ginger Carne, CRNAPre-anesthesia Checklist: Patient identified, Emergency Drugs available, Suction available, Patient being monitored and Timeout performed Patient Re-evaluated:Patient Re-evaluated prior to induction Oxygen Delivery Method: Nasal cannula Preoxygenation: Pre-oxygenation with 100% oxygen Induction Type: IV induction

## 2023-03-16 ENCOUNTER — Encounter: Payer: BC Managed Care – PPO | Admitting: *Deleted

## 2023-03-16 DIAGNOSIS — Z955 Presence of coronary angioplasty implant and graft: Secondary | ICD-10-CM

## 2023-03-16 DIAGNOSIS — Z48812 Encounter for surgical aftercare following surgery on the circulatory system: Secondary | ICD-10-CM | POA: Diagnosis not present

## 2023-03-16 DIAGNOSIS — I213 ST elevation (STEMI) myocardial infarction of unspecified site: Secondary | ICD-10-CM

## 2023-03-16 NOTE — Progress Notes (Signed)
Daily Session Note  Patient Details  Name: Keith Torres. MRN: [id number] Date of Birth: 19-Mar-1966 Referring Provider:   Flowsheet Row Cardiac Rehab from 02/26/2023 in [facility] Cardiac and Pulmonary Rehab  Referring Provider Dr. Clotilde Dieter, DO       Encounter Date: 03/16/2023  Check In:  Session Check In - 03/16/23 0750       Check-In   Supervising physician immediately available to respond to emergencies See telemetry face sheet for immediately available ER MD    Location [facility]-Cardiac & Pulmonary Rehab    Staff Present Bess Kinds RN, BSN;Margaret Best, MS, Exercise Physiologist;Noah Tickle, BS, Exercise Physiologist;Maxon Conetta BS, , Exercise Physiologist    Virtual Visit No    Medication changes reported     No    Fall or balance concerns reported    No    Warm-up and Cool-down Performed on first and last piece of equipment    Resistance Training Performed Yes    VAD Patient? No    PAD/SET Patient? No      Pain Assessment   Currently in Pain? No/denies                Social History   Tobacco Use  Smoking Status Never  Smokeless Tobacco Former   Types: Chew   Quit date: 1999    Goals Met:  Independence with exercise equipment Exercise tolerated well No report of concerns or symptoms today Strength training completed today  Goals Unmet:  Not Applicable  Comments: Pt able to follow exercise prescription today without complaint.  Will continue to monitor for progression.    Dr. Bethann Punches is Medical Director for [facility] Cardiac Rehabilitation.  Dr. Vida Rigger is Medical Director for [facility] Pulmonary Rehabilitation.

## 2023-03-19 ENCOUNTER — Encounter: Payer: BC Managed Care – PPO | Admitting: *Deleted

## 2023-03-19 DIAGNOSIS — Z955 Presence of coronary angioplasty implant and graft: Secondary | ICD-10-CM

## 2023-03-19 DIAGNOSIS — Z48812 Encounter for surgical aftercare following surgery on the circulatory system: Secondary | ICD-10-CM | POA: Diagnosis not present

## 2023-03-19 DIAGNOSIS — I213 ST elevation (STEMI) myocardial infarction of unspecified site: Secondary | ICD-10-CM

## 2023-03-19 NOTE — Progress Notes (Signed)
Daily Session Note  Patient Details  Name: Keith Torres. MRN: [id number] Date of Birth: [birth year] Referring Provider:   Flowsheet Row Cardiac Rehab from [facility]/26/[facility] in [facility] Cardiac and Pulmonary Rehab  Referring Provider Dr. Clotilde Dieter, DO       Encounter Date: 03/19/2023  Check In:  Session Check In - 03/19/23 0820       Check-In   Supervising physician immediately available to respond to emergencies See telemetry face sheet for immediately available ER MD    Location [facility]-Cardiac & Pulmonary Rehab    Staff Present Balinda Quails, RDN, LDN;Joseph [place], RCP,RRT,BSRT;Kelly Madilyn Fireman, BS, ACSM CEP, Exercise Physiologist;Natascha Edmonds Katrinka Blazing, RN, ADN    Virtual Visit No    Medication changes reported     No    Fall or balance concerns reported    No    Warm-up and Cool-down Performed on first and last piece of equipment    Resistance Training Performed Yes    VAD Patient? No    PAD/SET Patient? No      Pain Assessment   Currently in Pain? No/denies                Social History   Tobacco Use  Smoking Status Never  Smokeless Tobacco Former   Types: Chew   Quit date: 1999    Goals Met:  Independence with exercise equipment Exercise tolerated well No report of concerns or symptoms today Strength training completed today  Goals Unmet:  Not Applicable  Comments: Pt able to follow exercise prescription today without complaint.  Will continue to monitor for progression.    Dr. Bethann Punches is Medical Director for [facility] Cardiac Rehabilitation.  Dr. Vida Rigger is Medical Director for [facility] Pulmonary Rehabilitation.

## 2023-03-21 ENCOUNTER — Encounter: Payer: BC Managed Care – PPO | Admitting: *Deleted

## 2023-03-21 DIAGNOSIS — I213 ST elevation (STEMI) myocardial infarction of unspecified site: Secondary | ICD-10-CM

## 2023-03-21 DIAGNOSIS — Z48812 Encounter for surgical aftercare following surgery on the circulatory system: Secondary | ICD-10-CM | POA: Diagnosis not present

## 2023-03-21 DIAGNOSIS — Z955 Presence of coronary angioplasty implant and graft: Secondary | ICD-10-CM

## 2023-03-21 NOTE — Progress Notes (Signed)
Daily Session Note  Patient Details  Name: Keith Torres. MRN: [id number] Date of Birth: 1966-06-04 Referring Provider:   Flowsheet Row Cardiac Rehab from 02/26/2023 in [facility] Cardiac and Pulmonary Rehab  Referring Provider Dr. Clotilde Dieter, DO       Encounter Date: 03/21/2023  Check In:  Session Check In - 03/21/23 0737       Check-In   Supervising physician immediately available to respond to emergencies See telemetry face sheet for immediately available ER MD    Location [facility]-Cardiac & Pulmonary Rehab    Staff Present Rory Percy, MS, Exercise Physiologist;Joseph Reino Kent, RCP,RRT,BSRT;Maxon [place] BS, , Exercise Physiologist;Montreal Steidle Katrinka Blazing, RN, ADN    Virtual Visit No    Medication changes reported     No    Fall or balance concerns reported    No    Warm-up and Cool-down Performed on first and last piece of equipment    Resistance Training Performed Yes    VAD Patient? No    PAD/SET Patient? No      Pain Assessment   Currently in Pain? No/denies                Social History   Tobacco Use  Smoking Status Never  Smokeless Tobacco Former   Types: Chew   Quit date: 1999    Goals Met:  Independence with exercise equipment Exercise tolerated well No report of concerns or symptoms today Strength training completed today  Goals Unmet:  Not Applicable  Comments: Pt able to follow exercise prescription today without complaint.  Will continue to monitor for progression.    Dr. Bethann Punches is Medical Director for [facility] Cardiac Rehabilitation.  Dr. Vida Rigger is Medical Director for [facility] Pulmonary Rehabilitation.

## 2023-03-22 ENCOUNTER — Ambulatory Visit
Admission: RE | Admit: 2023-03-22 | Discharge: 2023-03-22 | Disposition: A | Discharge status: Home / Self Care | Payer: BC Managed Care – PPO | Source: Ambulatory Visit | Attending: Urology | Admitting: Urology

## 2023-03-22 DIAGNOSIS — R972 Elevated prostate specific antigen [PSA]: Secondary | ICD-10-CM | POA: Diagnosis present

## 2023-03-22 MED ORDER — GADOBUTROL 1 MMOL/ML IV SOLN
10.0000 mL | Freq: Once | INTRAVENOUS | Status: AC | PRN
  Administered 2023-03-22: 10 mL via INTRAVENOUS

## 2023-03-23 ENCOUNTER — Encounter: Payer: BC Managed Care – PPO | Admitting: *Deleted

## 2023-03-23 DIAGNOSIS — Z48812 Encounter for surgical aftercare following surgery on the circulatory system: Secondary | ICD-10-CM | POA: Diagnosis not present

## 2023-03-23 DIAGNOSIS — Z955 Presence of coronary angioplasty implant and graft: Secondary | ICD-10-CM

## 2023-03-23 DIAGNOSIS — I213 ST elevation (STEMI) myocardial infarction of unspecified site: Secondary | ICD-10-CM

## 2023-03-23 NOTE — Progress Notes (Signed)
Daily Session Note  Patient Details  Name: Keith Torres. MRN: [id number] Date of Birth: April 22, 1966 Referring Provider:   Flowsheet Row Cardiac Rehab from 02/26/2023 in [facility] Cardiac and Pulmonary Rehab  Referring Provider Dr. Clotilde Dieter, DO       Encounter Date: 03/23/2023  Check In:  Session Check In - 03/23/23 0810       Check-In   Supervising physician immediately available to respond to emergencies See telemetry face sheet for immediately available ER MD    Location [facility]-Cardiac & Pulmonary Rehab    Staff Present Cora Collum, RN, BSN, CCRP;Joseph Hood, RCP,RRT,BSRT;Noah Tickle, [place], Exercise Physiologist    Virtual Visit No    Medication changes reported     No    Fall or balance concerns reported    No    Warm-up and Cool-down Performed on first and last piece of equipment    Resistance Training Performed Yes    VAD Patient? No    PAD/SET Patient? No      Pain Assessment   Currently in Pain? No/denies                Social History   Tobacco Use  Smoking Status Never  Smokeless Tobacco Former   Types: Chew   Quit date: 1999    Goals Met:  Independence with exercise equipment Exercise tolerated well No report of concerns or symptoms today  Goals Unmet:  Not Applicable  Comments: Pt able to follow exercise prescription today without complaint.  Will continue to monitor for progression.    Dr. Bethann Punches is Medical Director for [facility] Cardiac Rehabilitation.  Dr. Vida Rigger is Medical Director for [facility] Pulmonary Rehabilitation.

## 2023-03-26 ENCOUNTER — Encounter: Payer: BC Managed Care – PPO | Admitting: *Deleted

## 2023-03-26 DIAGNOSIS — I213 ST elevation (STEMI) myocardial infarction of unspecified site: Secondary | ICD-10-CM

## 2023-03-26 DIAGNOSIS — Z48812 Encounter for surgical aftercare following surgery on the circulatory system: Secondary | ICD-10-CM | POA: Diagnosis not present

## 2023-03-26 DIAGNOSIS — Z955 Presence of coronary angioplasty implant and graft: Secondary | ICD-10-CM

## 2023-03-26 NOTE — Progress Notes (Signed)
Daily Session Note  Patient Details  Name: Keith Torres. MRN: [id number] Date of Birth: 1965-12-05 Referring Provider:   Flowsheet Row Cardiac Rehab from 02/26/2023 in [facility] Cardiac and Pulmonary Rehab  Referring Provider Dr. Clotilde Dieter, DO       Encounter Date: 03/26/2023  Check In:  Session Check In - 03/26/23 0806       Check-In   Supervising physician immediately available to respond to emergencies See telemetry face sheet for immediately available ER MD    Location [facility]-Cardiac & Pulmonary Rehab    Staff Present Cora Collum, RN, BSN, CCRP;Jason Wallace Cullens, RDN, Fonda Kinder, BS, ACSM CEP, Exercise Physiologist;Joseph Reino Kent, [place]    Virtual Visit No    Medication changes reported     No    Fall or balance concerns reported    No    Warm-up and Cool-down Performed on first and last piece of equipment    Resistance Training Performed Yes    VAD Patient? No    PAD/SET Patient? No      Pain Assessment   Currently in Pain? No/denies                Social History   Tobacco Use  Smoking Status Never  Smokeless Tobacco Former   Types: Chew   Quit date: 1999    Goals Met:  Independence with exercise equipment Exercise tolerated well No report of concerns or symptoms today  Goals Unmet:  Not Applicable  Comments: Pt able to follow exercise prescription today without complaint.  Will continue to monitor for progression.    Dr. Bethann Punches is Medical Director for [facility] Cardiac Rehabilitation.  Dr. Vida Rigger is Medical Director for [facility] Pulmonary Rehabilitation.

## 2023-03-28 ENCOUNTER — Encounter: Payer: BC Managed Care – PPO | Admitting: *Deleted

## 2023-03-28 DIAGNOSIS — I213 ST elevation (STEMI) myocardial infarction of unspecified site: Secondary | ICD-10-CM

## 2023-03-28 DIAGNOSIS — Z48812 Encounter for surgical aftercare following surgery on the circulatory system: Secondary | ICD-10-CM | POA: Diagnosis not present

## 2023-03-28 DIAGNOSIS — Z955 Presence of coronary angioplasty implant and graft: Secondary | ICD-10-CM

## 2023-03-28 NOTE — Progress Notes (Signed)
Daily Session Note  Patient Details  Name: Keith Torres. MRN: [id number] Date of Birth: Mar 17, 1966 Referring Provider:   Flowsheet Row Cardiac Rehab from 02/26/2023 in [facility] Cardiac and Pulmonary Rehab  Referring Provider Dr. Clotilde Dieter, DO       Encounter Date: 03/28/2023  Check In:  Session Check In - 03/28/23 0748       Check-In   Supervising physician immediately available to respond to emergencies See telemetry face sheet for immediately available ER MD    Location [facility]-Cardiac & Pulmonary Rehab    Staff Present Rory Percy, MS, Exercise Physiologist;Joseph Reino Kent, RCP,RRT,BSRT;Maxon [place] BS, , Exercise Physiologist;Shakil Dirk Katrinka Blazing, RN, ADN    Virtual Visit No    Medication changes reported     No    Fall or balance concerns reported    No    Warm-up and Cool-down Performed on first and last piece of equipment    Resistance Training Performed Yes    VAD Patient? No    PAD/SET Patient? No      Pain Assessment   Currently in Pain? No/denies                Social History   Tobacco Use  Smoking Status Never  Smokeless Tobacco Former   Types: Chew   Quit date: 1999    Goals Met:  Independence with exercise equipment Exercise tolerated well No report of concerns or symptoms today Strength training completed today  Goals Unmet:  Not Applicable  Comments: Pt able to follow exercise prescription today without complaint.  Will continue to monitor for progression.    Dr. Bethann Punches is Medical Director for [facility] Cardiac Rehabilitation.  Dr. Vida Rigger is Medical Director for [facility] Pulmonary Rehabilitation.

## 2023-03-30 ENCOUNTER — Encounter: Payer: BC Managed Care – PPO | Admitting: *Deleted

## 2023-03-30 ENCOUNTER — Encounter: Payer: Self-pay | Admitting: *Deleted

## 2023-03-30 DIAGNOSIS — Z48812 Encounter for surgical aftercare following surgery on the circulatory system: Secondary | ICD-10-CM | POA: Diagnosis not present

## 2023-03-30 DIAGNOSIS — Z955 Presence of coronary angioplasty implant and graft: Secondary | ICD-10-CM

## 2023-03-30 DIAGNOSIS — I213 ST elevation (STEMI) myocardial infarction of unspecified site: Secondary | ICD-10-CM

## 2023-03-30 NOTE — Progress Notes (Signed)
Daily Session Note  Patient Details  Name: Keith Torres. MRN: [id number] Date of Birth: January 24, 1966 Referring Provider:   Flowsheet Row Cardiac Rehab from 02/26/2023 in [facility] Cardiac and Pulmonary Rehab  Referring Provider Dr. Clotilde Dieter, DO       Encounter Date: 03/30/2023  Check In:  Session Check In - 03/30/23 0801       Check-In   Supervising physician immediately available to respond to emergencies See telemetry face sheet for immediately available ER MD    Location [facility]-Cardiac & Pulmonary Rehab    Staff Present Cora Collum, RN, BSN, CCRP;Noah Tickle, BS, Exercise Physiologist;Joseph [place], [place]    Virtual Visit No    Medication changes reported     No    Fall or balance concerns reported    No    Warm-up and Cool-down Performed on first and last piece of equipment    Resistance Training Performed Yes    VAD Patient? No    PAD/SET Patient? No      Pain Assessment   Currently in Pain? No/denies                Social History   Tobacco Use  Smoking Status Never  Smokeless Tobacco Former   Types: Chew   Quit date: 1999    Goals Met:  Independence with exercise equipment Exercise tolerated well No report of concerns or symptoms today  Goals Unmet:  Not Applicable  Comments: Pt able to follow exercise prescription today without complaint.  Will continue to monitor for progression.    Dr. Bethann Punches is Medical Director for [facility] Cardiac Rehabilitation.  Dr. Vida Rigger is Medical Director for [facility] Pulmonary Rehabilitation.

## 2023-04-04 ENCOUNTER — Encounter: Payer: Self-pay | Admitting: *Deleted

## 2023-04-04 DIAGNOSIS — Z955 Presence of coronary angioplasty implant and graft: Secondary | ICD-10-CM

## 2023-04-04 DIAGNOSIS — I213 ST elevation (STEMI) myocardial infarction of unspecified site: Secondary | ICD-10-CM

## 2023-04-04 NOTE — Progress Notes (Signed)
Cardiac Individual Treatment Plan  Patient Details  Name: Keith Torres. MRN: [id number] Date of Birth: [birth year] Referring Provider:   Flowsheet Row Cardiac Rehab from 02/26/2023 in [facility] Cardiac and Pulmonary Rehab  Referring Provider Dr. Clotilde Dieter, DO       Initial Encounter Date:  Flowsheet Row Cardiac Rehab from 02/26/2023 in [facility] Cardiac and Pulmonary Rehab  Date 02/26/23       Visit Diagnosis: ST elevation myocardial infarction (STEMI), unspecified artery [facility])  Status post coronary artery stent placement  Patient's Home Medications on Admission:  Current Outpatient Medications:    apixaban (ELIQUIS) 5 MG TABS tablet, Take 1 tablet (5 mg total) by mouth 2 (two) times daily., Disp: 60 tablet, Rfl: 1   benzonatate (TESSALON) 200 MG capsule, Take 1 capsule (200 mg total) by mouth 3 (three) times daily as needed for cough. (Patient not taking: Reported on 03/15/2023), Disp: 30 capsule, Rfl: 0   diazepam (VALIUM) 10 MG tablet, 1 tab po 30 min prior to procedure.  Will need a driver to procedure with this medication (Patient not taking: Reported on 03/15/2023), Disp: 1 tablet, Rfl: 0   empagliflozin (JARDIANCE) 10 MG TABS tablet, Take 1 tablet by mouth daily. (Patient not taking: Reported on 03/15/2023), Disp: , Rfl:    furosemide (LASIX) 20 MG tablet, Take 1 tablet (20 mg total) by mouth daily., Disp: 30 tablet, Rfl: 1   meloxicam (MOBIC) 7.5 MG tablet, Take 1 tablet by mouth daily. (Patient not taking: Reported on 03/15/2023), Disp: , Rfl:    metoprolol tartrate (LOPRESSOR) 50 MG tablet, Take 1 tablet (50 mg total) by mouth 2 (two) times daily. (Patient taking differently: Take 100 mg by mouth 2 (two) times daily.), Disp: 60 tablet, Rfl: 1   metoprolol tartrate (LOPRESSOR) 50 MG tablet, Take 1 tablet by mouth 3 (three) times daily. (Patient not taking: Reported on 03/15/2023), Disp: , Rfl:    simethicone (MYLICON) 80 MG chewable tablet, Chew 1 tablet (80 mg total) by  mouth 4 (four) times daily. (Patient not taking: Reported on 03/15/2023), Disp: 30 tablet, Rfl: 0   simethicone (MYLICON) 80 MG chewable tablet, Chew by mouth., Disp: , Rfl:    Spacer/Aero Chamber Mouthpiece MISC, One spacer to use with albuterol inhaler., Disp: 1 each, Rfl: 0  Past Medical History: Past Medical History:  Diagnosis Date   Anxiety    Atrial fibrillation (HCC)    Dental crowns present    FRONT TWO TEETH   Deviated septum    Dysrhythmia    A-FIB/ DR Gwen Pounds   Hyperlipidemia    Hypertrophy of nasal turbinates    Meningitis    Obesity    Rhinitis, allergic    Seasonal allergies    Sleep apnea    C-PAP    Tobacco Use: Social History   Tobacco Use  Smoking Status Never  Smokeless Tobacco Former   Types: Chew   Quit date: 1999    Labs: Review Flowsheet       Latest Ref Rng & Units 04/10/2017 01/12/2023 01/13/2023  Labs for ITP Cardiac and Pulmonary Rehab  Cholestrol 0 - 200 mg/dL 132  440  102   LDL (calc) 0 - 99 mg/dL 725  366  77   HDL-C >44 mg/dL 52  51  54   Trlycerides <150 mg/dL 034  742  90   Hemoglobin A1c 4.8 - 5.6 % - - 5.4     Details  Exercise Target Goals: Exercise Program Goal: Individual exercise prescription set using results from initial 6 min walk test and THRR while considering  patient's activity barriers and safety.   Exercise Prescription Goal: Initial exercise prescription builds to 30-45 minutes a day of aerobic activity, 2-3 days per week.  Home exercise guidelines will be given to patient during program as part of exercise prescription that the participant will acknowledge.   Education: Aerobic Exercise: - Group verbal and visual presentation on the components of exercise prescription. Introduces F.I.T.T principle from ACSM for exercise prescriptions.  Reviews F.I.T.T. principles of aerobic exercise including progression. Written material given at graduation. Flowsheet Row Cardiac Rehab from 02/26/2023 in [facility]  Cardiac and Pulmonary Rehab  Education need identified 02/26/23       Education: Resistance Exercise: - Group verbal and visual presentation on the components of exercise prescription. Introduces F.I.T.T principle from ACSM for exercise prescriptions  Reviews F.I.T.T. principles of resistance exercise including progression. Written material given at graduation.    Education: Exercise & Equipment Safety: - Individual verbal instruction and demonstration of equipment use and safety with use of the equipment. Flowsheet Row Cardiac Rehab from 02/26/2023 in [facility] Cardiac and Pulmonary Rehab  Date 02/07/23  Educator [facility]  Instruction Review Code 1- Verbalizes Understanding       Education: Exercise Physiology & General Exercise Guidelines: - Group verbal and written instruction with models to review the exercise physiology of the cardiovascular system and associated critical values. Provides general exercise guidelines with specific guidelines to those with heart or lung disease.    Education: Flexibility, Balance, Mind/Body Relaxation: - Group verbal and visual presentation with interactive activity on the components of exercise prescription. Introduces F.I.T.T principle from ACSM for exercise prescriptions. Reviews F.I.T.T. principles of flexibility and balance exercise training including progression. Also discusses the mind body connection.  Reviews various relaxation techniques to help reduce and manage stress (i.e. Deep breathing, progressive muscle relaxation, and visualization). Balance handout provided to take home. Written material given at graduation.   Activity Barriers & Risk Stratification:  Activity Barriers & Cardiac Risk Stratification - 02/26/23 1139       Activity Barriers & Cardiac Risk Stratification   Activity Barriers Other (comment)    Comments Osteoarthritis knee, Gout    Cardiac Risk Stratification Moderate             6 Minute Walk:  6 Minute Walk     Row  Name 02/26/23 1136         6 Minute Walk   Phase Initial     Distance 1010 feet     Walk Time 6 minutes     # of Rest Breaks 0     MPH 1.91     METS 3.07     RPE 13     Perceived Dyspnea  2     VO2 Peak 10.75     Symptoms No     Resting HR 108 bpm     Resting BP 94/60     Resting Oxygen Saturation  98 %     Exercise Oxygen Saturation  during 6 min walk 96 %     Max Ex. HR 133 bpm     Max Ex. BP 108/66     2 Minute Post BP 98/64              Oxygen Initial Assessment:   Oxygen Re-Evaluation:   Oxygen Discharge (Final Oxygen Re-Evaluation):   Initial Exercise Prescription:  Initial Exercise  Prescription - 02/26/23 1100       Date of Initial Exercise RX and Referring Provider   Date 02/26/23    Referring Provider Dr. Clotilde Dieter, DO      Oxygen   Maintain Oxygen Saturation 88% or higher      Treadmill   MPH 1.8    Grade 0    Minutes 15    METs 2.38      Recumbant Bike   Level 2    RPM 50    Watts 36    Minutes 15    METs 3.07      NuStep   Level 2    SPM 80    Minutes 15    METs 3.07      REL-XR   Level 2    Speed 50    Minutes 15    METs 3.07      Track   Laps 30    Minutes 15    METs 3.07      Prescription Details   Frequency (times per week) 3    Duration Progress to 30 minutes of continuous aerobic without signs/symptoms of physical distress      Intensity   THRR 40-80% of Max Heartrate 129-151    Ratings of Perceived Exertion 11-13    Perceived Dyspnea 0-4      Resistance Training   Training Prescription Yes    Weight 5 lb    Reps 10-15             Perform Capillary Blood Glucose checks as needed.  Exercise Prescription Changes:   Exercise Prescription Changes     Row Name 02/26/23 1100 03/15/23 0800 03/29/23 1400         Response to Exercise   Blood Pressure (Admit) 94/60 110/72 116/62     Blood Pressure (Exercise) 108/66 128/58 124/68     Blood Pressure (Exit) 98/64 118/62 98/58     Heart Rate  (Admit) 108 bpm 97 bpm 109 bpm     Heart Rate (Exercise) 133 bpm 131 bpm 134 bpm     Heart Rate (Exit) 122 bpm 105 bpm 108 bpm     Oxygen Saturation (Admit) 98 % -- --     Oxygen Saturation (Exercise) 96 % -- --     Rating of Perceived Exertion (Exercise) 13 12 13      Perceived Dyspnea (Exercise) 2 -- --     Symptoms none none none     Comments Results First two weeks of exercise --     Duration -- Progress to 30 minutes of  aerobic without signs/symptoms of physical distress Progress to 30 minutes of  aerobic without signs/symptoms of physical distress     Intensity -- THRR unchanged THRR unchanged       Progression   Progression -- Continue to progress workloads to maintain intensity without signs/symptoms of physical distress. Continue to progress workloads to maintain intensity without signs/symptoms of physical distress.     Average METs -- 3.3 3.34       Resistance Training   Training Prescription -- Yes Yes     Weight -- 5 lb 5 lb     Reps -- 10-15 10-15       Interval Training   Interval Training -- No No       Treadmill   MPH -- 3 3     Grade -- 0 1     Minutes -- 15 15  METs -- 3.3 3.71       Recumbant Bike   Level -- -- 2     Watts -- -- 36     Minutes -- -- 15     METs -- -- 3.07       NuStep   Level -- 5 4     Minutes -- 15 15     METs -- 4.5 3.7       REL-XR   Level -- 2 3  went up to level 4     Minutes -- 15 15     METs -- 4.2 2.6       T5 Nustep   Level -- -- 2     Minutes -- -- 15     METs -- -- 2.7       Oxygen   Maintain Oxygen Saturation -- 88% or higher 88% or higher              Exercise Comments:   Exercise Comments     Row Name 02/28/23 0756           Exercise Comments First full day of exercise!  Patient was oriented to gym and equipment including functions, settings, policies, and procedures.  Patient's individual exercise prescription and treatment plan were reviewed.  All starting workloads were established  based on the results of the 6 minute walk test done at initial orientation visit.  The plan for exercise progression was also introduced and progression will be customized based on patient's performance and goals.                Exercise Goals and Review:   Exercise Goals     Row Name 02/26/23 1139             Exercise Goals   Increase Physical Activity Yes       Intervention Provide advice, education, support and counseling about physical activity/exercise needs.;Develop an individualized exercise prescription for aerobic and resistive training based on initial evaluation findings, risk stratification, comorbidities and participant's personal goals.       Expected Outcomes Short Term: Attend rehab on a regular basis to increase amount of physical activity.;Long Term: Add in home exercise to make exercise part of routine and to increase amount of physical activity.;Long Term: Exercising regularly at least 3-5 days a week.       Increase Strength and Stamina Yes       Intervention Provide advice, education, support and counseling about physical activity/exercise needs.;Develop an individualized exercise prescription for aerobic and resistive training based on initial evaluation findings, risk stratification, comorbidities and participant's personal goals.       Expected Outcomes Short Term: Increase workloads from initial exercise prescription for resistance, speed, and METs.;Short Term: Perform resistance training exercises routinely during rehab and add in resistance training at home;Long Term: Improve cardiorespiratory fitness, muscular endurance and strength as measured by increased METs and functional capacity ( )       Able to understand and use rate of perceived exertion (RPE) scale Yes       Intervention Provide education and explanation on how to use RPE scale       Expected Outcomes Short Term: Able to use RPE daily in rehab to express subjective intensity level;Long Term:   Able to use RPE to guide intensity level when exercising independently       Able to understand and use Dyspnea scale Yes       Intervention Provide education and  explanation on how to use Dyspnea scale       Expected Outcomes Short Term: Able to use Dyspnea scale daily in rehab to express subjective sense of shortness of breath during exertion;Long Term: Able to use Dyspnea scale to guide intensity level when exercising independently       Knowledge and understanding of Target Heart Rate Range (THRR) Yes       Intervention Provide education and explanation of THRR including how the numbers were predicted and where they are located for reference       Expected Outcomes Short Term: Able to state/look up THRR;Long Term: Able to use THRR to govern intensity when exercising independently;Short Term: Able to use daily as guideline for intensity in rehab       Able to check pulse independently Yes       Intervention Review the importance of being able to check your own pulse for safety during independent exercise;Provide education and demonstration on how to check pulse in carotid and radial arteries.       Expected Outcomes Short Term: Able to explain why pulse checking is important during independent exercise;Long Term: Able to check pulse independently and accurately       Understanding of Exercise Prescription Yes       Intervention Provide education, explanation, and written materials on patient's individual exercise prescription       Expected Outcomes Short Term: Able to explain program exercise prescription;Long Term: Able to explain home exercise prescription to exercise independently                Exercise Goals Re-Evaluation :  Exercise Goals Re-Evaluation     Row Name 02/28/23 0756 03/15/23 0847 03/29/23 1417         Exercise Goal Re-Evaluation   Exercise Goals Review Able to understand and use rate of perceived exertion (RPE) scale;Able to understand and use Dyspnea  scale;Knowledge and understanding of Target Heart Rate Range (THRR);Understanding of Exercise Prescription Increase Physical Activity;Increase Strength and Stamina;Understanding of Exercise Prescription Increase Physical Activity;Increase Strength and Stamina;Understanding of Exercise Prescription     Comments Reviewed RPE and dyspnea scale, THR and program prescription with pt today.  Pt voiced understanding and was given a copy of goals to take home. Keith Torres is off to a good start in the program. He has completed his first 3 session dring this review. During these sessions he was able to increase his level on the T4 nustep from 2 to 5. He was also able to increase his speed on the treadmill from 1.8 mph to 3 mph. He also seems to be doing well with the 5 lb handweights. We will continue to monitor his progress in the program. Keith Torres is doing well in rehab. He has recently been able to increase his level on the XR from level 2 to 3. He also added an incline of 1% to his treadmill workload. We will continue to monitor his progress in the program.     Expected Outcomes Short: Use RPE daily to regulate intensity. Long: Follow program prescription in THR. Short: Continue to follow current exercise prescription. Long: Continue exercise to improve strength and stamina. Short: Continue to follow current exercise prescription. Long: Continue exercise to improve strength and stamina.              Discharge Exercise Prescription (Final Exercise Prescription Changes):  Exercise Prescription Changes - 03/29/23 1400       Response to Exercise   Blood Pressure (Admit) 116/62  Blood Pressure (Exercise) 124/68    Blood Pressure (Exit) 98/58    Heart Rate (Admit) 109 bpm    Heart Rate (Exercise) 134 bpm    Heart Rate (Exit) 108 bpm    Rating of Perceived Exertion (Exercise) 13    Symptoms none    Duration Progress to 30 minutes of  aerobic without signs/symptoms of physical distress    Intensity THRR  unchanged      Progression   Progression Continue to progress workloads to maintain intensity without signs/symptoms of physical distress.    Average METs 3.34      Resistance Training   Training Prescription Yes    Weight 5 lb    Reps 10-15      Interval Training   Interval Training No      Treadmill   MPH 3    Grade 1    Minutes 15    METs 3.71      Recumbant Bike   Level 2    Watts 36    Minutes 15    METs 3.07      NuStep   Level 4    Minutes 15    METs 3.7      REL-XR   Level 3   went up to level 4   Minutes 15    METs 2.6      T5 Nustep   Level 2    Minutes 15    METs 2.7      Oxygen   Maintain Oxygen Saturation 88% or higher             Nutrition:  Target Goals: Understanding of nutrition guidelines, daily intake of sodium 1500mg , cholesterol 200mg , calories 30% from fat and 7% or less from saturated fats, daily to have 5 or more servings of fruits and vegetables.  Education: All About Nutrition: -Group instruction provided by verbal, written material, interactive activities, discussions, models, and posters to present general guidelines for heart healthy nutrition including fat, fiber, MyPlate, the role of sodium in heart healthy nutrition, utilization of the nutrition label, and utilization of this knowledge for meal planning. Follow up email sent as well. Written material given at graduation. Flowsheet Row Cardiac Rehab from 02/26/2023 in [facility] Cardiac and Pulmonary Rehab  Education need identified 02/26/23       Biometrics:  Pre Biometrics - 02/26/23 1140       Pre Biometrics   Height 6\' 2"  (1.88 m)    Weight 233 lb 8 oz (105.9 kg)    Waist Circumference 41.5 inches    Hip Circumference 43.5 inches    Waist to Hip Ratio 0.95 %    BMI (Calculated) 29.97    Single Leg Stand 10.1 seconds              Nutrition Therapy Plan and Nutrition Goals:  Nutrition Therapy & Goals - 02/26/23 1147       Intervention Plan   Intervention  Prescribe, educate and counsel regarding individualized specific dietary modifications aiming towards targeted core components such as weight, hypertension, lipid management, diabetes, heart failure and other comorbidities.    Expected Outcomes Short Term Goal: Understand basic principles of dietary content, such as calories, fat, sodium, cholesterol and nutrients.;Short Term Goal: A plan has been developed with personal nutrition goals set during dietitian appointment.;Long Term Goal: Adherence to prescribed nutrition plan.             Nutrition Assessments:  MEDIFICTS Score Key: >=70 Need to make  dietary changes  40-70 Heart Healthy Diet <= 40 Therapeutic Level Cholesterol Diet  Flowsheet Row Cardiac Rehab from 02/26/2023 in [facility] Cardiac and Pulmonary Rehab  Picture Your Plate Total Score on Admission 47      Picture Your Plate Scores: <40 Unhealthy dietary pattern with much room for improvement. 41-50 Dietary pattern unlikely to meet recommendations for good health and room for improvement. 51-60 More healthful dietary pattern, with some room for improvement.  >60 Healthy dietary pattern, although there may be some specific behaviors that could be improved.    Nutrition Goals Re-Evaluation:  Nutrition Goals Re-Evaluation     Row Name 03/19/23 0810             Goals   Comment He reports he was eating only veggies when he got out of the hospital becasue he knew meat was bad for him. Reports he is loves meat, and has gone back to eating it regularly. Spoke with him about not commiting to all veggies or all carbs or all meat diets, but rather make more balanced plates with smaller portions of lean meat with controlled portions of carbs and larger portions of non starchy veggies.       Expected Outcome Short: work on reducing portions of meat, choosing leaner cuts, and including more colorful produce on plates. Long: maintain a heart healthy diet with balanced plates                 Nutrition Goals Discharge (Final Nutrition Goals Re-Evaluation):  Nutrition Goals Re-Evaluation - 03/19/23 0810       Goals   Comment He reports he was eating only veggies when he got out of the hospital becasue he knew meat was bad for him. Reports he is loves meat, and has gone back to eating it regularly. Spoke with him about not commiting to all veggies or all carbs or all meat diets, but rather make more balanced plates with smaller portions of lean meat with controlled portions of carbs and larger portions of non starchy veggies.    Expected Outcome Short: work on reducing portions of meat, choosing leaner cuts, and including more colorful produce on plates. Long: maintain a heart healthy diet with balanced plates             Psychosocial: Target Goals: Acknowledge presence or absence of significant depression and/or stress, maximize coping skills, provide positive support system. Participant is able to verbalize types and ability to use techniques and skills needed for reducing stress and depression.   Education: Stress, Anxiety, and Depression - Group verbal and visual presentation to define topics covered.  Reviews how body is impacted by stress, anxiety, and depression.  Also discusses healthy ways to reduce stress and to treat/manage anxiety and depression.  Written material given at graduation. Flowsheet Row Cardiac Rehab from 02/26/2023 in [facility] Cardiac and Pulmonary Rehab  Education need identified 02/26/23       Education: Sleep Hygiene -Provides group verbal and written instruction about how sleep can affect your health.  Define sleep hygiene, discuss sleep cycles and impact of sleep habits. Review good sleep hygiene tips.    Initial Review & Psychosocial Screening:  Initial Psych Review & Screening - 02/07/23 0910       Initial Review   Current issues with None Identified      Family Dynamics   Good Support System? Yes    Comments He can look to his  wife and  his son for support.  He  has a good outlook on his health and is ready to start rehab .      Barriers   Psychosocial barriers to participate in program There are no identifiable barriers or psychosocial needs.;The patient should benefit from training in stress management and relaxation.      Screening Interventions   Interventions Encouraged to exercise;To provide support and resources with identified psychosocial needs;Provide feedback about the scores to participant    Expected Outcomes Short Term goal: Utilizing psychosocial counselor, staff and physician to assist with identification of specific Stressors or current issues interfering with healing process. Setting desired goal for each stressor or current issue identified.;Long Term Goal: Stressors or current issues are controlled or eliminated.;Long Term goal: The participant improves quality of Life and PHQ9 Scores as seen by post scores and/or verbalization of changes;Short Term goal: Identification and review with participant of any Quality of Life or Depression concerns found by scoring the questionnaire.             Quality of Life Scores:   Quality of Life - 02/26/23 1146       Quality of Life   Select Quality of Life      Quality of Life Scores   Health/Function Pre 18.9 %    Socioeconomic Pre 30 %    Psych/Spiritual Pre 28.07 %    Family Pre 27.6 %    GLOBAL Pre 24.18 %            Scores of 19 and below usually indicate a poorer quality of life in these areas.  A difference of  2-3 points is a clinically meaningful difference.  A difference of 2-3 points in the total score of the Quality of Life Index has been associated with significant improvement in overall quality of life, self-image, physical symptoms, and general health in studies assessing change in quality of life.  PHQ-9: Review Flowsheet       03/21/2023 02/26/2023  Depression screen PHQ 2/9  Decreased Interest 0 0  Down, Depressed, Hopeless  0 0  PHQ - 2 Score 0 0  Altered sleeping 0 1  Tired, decreased energy 3 3  Change in appetite 0 0  Feeling bad or failure about yourself  0 0  Trouble concentrating 0 0  Moving slowly or fidgety/restless 0 1  Suicidal thoughts 0 0  PHQ-9 Score 3 5  Difficult doing work/chores Not difficult at all Very difficult    Details           Interpretation of Total Score  Total Score Depression Severity:  1-4 = Minimal depression, 5-9 = Mild depression, 10-14 = Moderate depression, 15-19 = Moderately severe depression, 20-27 = Severe depression   Psychosocial Evaluation and Intervention:  Psychosocial Evaluation - 02/07/23 0913       Psychosocial Evaluation & Interventions   Interventions Relaxation education;Stress management education;Encouraged to exercise with the program and follow exercise prescription    Comments He can look to his wife and his son for support. He has a good outlook on his health and is ready to start rehab .    Expected Outcomes Short: Start [facility] to help with mood. Long: Maintain a healthy mental state    Continue Psychosocial Services  Follow up required by staff             Psychosocial Re-Evaluation:  Psychosocial Re-Evaluation     Row Name 03/21/23 [phone]             Psychosocial Re-Evaluation  Current issues with None Identified       Comments Reviewed patient health questionnaire (PHQ-9) with patient for follow up. Previously, patients score indicated signs/symptoms of depression.  Reviewed to see if patient is improving symptom wise while in program.  Score improved and patient states that it is because he has been able to go back to work.       Expected Outcomes Short: Continue to attend [facility] regularly for regular exercise and social engagement. Long: Continue to improve symptoms and manage a positive mental state.       Interventions Encouraged to attend Cardiac Rehabilitation for the exercise       Continue Psychosocial Services   Follow up required by staff                Psychosocial Discharge (Final Psychosocial Re-Evaluation):  Psychosocial Re-Evaluation - 03/21/23 0738       Psychosocial Re-Evaluation   Current issues with None Identified    Comments Reviewed patient health questionnaire (PHQ-9) with patient for follow up. Previously, patients score indicated signs/symptoms of depression.  Reviewed to see if patient is improving symptom wise while in program.  Score improved and patient states that it is because he has been able to go back to work.    Expected Outcomes Short: Continue to attend [facility] regularly for regular exercise and social engagement. Long: Continue to improve symptoms and manage a positive mental state.    Interventions Encouraged to attend Cardiac Rehabilitation for the exercise    Continue Psychosocial Services  Follow up required by staff             Vocational Rehabilitation: Provide vocational rehab assistance to qualifying candidates.   Vocational Rehab Evaluation & Intervention:   Education: Education Goals: Education classes will be provided on a variety of topics geared toward better understanding of heart health and risk factor modification. Participant will state understanding/return demonstration of topics presented as noted by education test scores.  Learning Barriers/Preferences:  Learning Barriers/Preferences - 02/07/23 1610       Learning Barriers/Preferences   Learning Barriers None    Learning Preferences None             General Cardiac Education Topics:  AED/CPR: - Group verbal and written instruction with the use of models to demonstrate the basic use of the AED with the basic ABC's of resuscitation.   Anatomy and Cardiac Procedures: - Group verbal and visual presentation and models provide information about basic cardiac anatomy and function. Reviews the testing methods done to diagnose heart disease and the outcomes of the test results.  Describes the treatment choices: Medical Management, Angioplasty, or Coronary Bypass Surgery for treating various heart conditions including Myocardial Infarction, Angina, Valve Disease, and Cardiac Arrhythmias.  Written material given at graduation. Flowsheet Row Cardiac Rehab from 02/26/2023 in [facility] Cardiac and Pulmonary Rehab  Education need identified 02/26/23       Medication Safety: - Group verbal and visual instruction to review commonly prescribed medications for heart and lung disease. Reviews the medication, class of the drug, and side effects. Includes the steps to properly store meds and maintain the prescription regimen.  Written material given at graduation.   Intimacy: - Group verbal instruction through game format to discuss how heart and lung disease can affect sexual intimacy. Written material given at graduation..   Know Your Numbers and Heart Failure: - Group verbal and visual instruction to discuss disease risk factors for cardiac and pulmonary disease and treatment options.  Reviews associated critical values for Overweight/Obesity, Hypertension, Cholesterol, and Diabetes.  Discusses basics of heart failure: signs/symptoms and treatments.  Introduces Heart Failure Zone chart for action plan for heart failure.  Written material given at graduation.   Infection Prevention: - Provides verbal and written material to individual with discussion of infection control including proper hand washing and proper equipment cleaning during exercise session. Flowsheet Row Cardiac Rehab from 02/26/2023 in [facility] Cardiac and Pulmonary Rehab  Date 02/07/23  Educator [facility]  Instruction Review Code 1- Verbalizes Understanding       Falls Prevention: - Provides verbal and written material to individual with discussion of falls prevention and safety. Flowsheet Row Cardiac Rehab from 02/26/2023 in [facility] Cardiac and Pulmonary Rehab  Date 02/07/23  Educator [facility]  Instruction Review Code 1-  Verbalizes Understanding       Other: -Provides group and verbal instruction on various topics (see comments)   Knowledge Questionnaire Score:  Knowledge Questionnaire Score - 02/26/23 1147       Knowledge Questionnaire Score   Pre Score 17/26             Core Components/Risk Factors/Patient Goals at Admission:  Personal Goals and Risk Factors at Admission - 02/07/23 0909       Core Components/Risk Factors/Patient Goals on Admission    Weight Management Yes;Weight Maintenance    Intervention Weight Management: Develop a combined nutrition and exercise program designed to reach desired caloric intake, while maintaining appropriate intake of nutrient and fiber, sodium and fats, and appropriate energy expenditure required for the weight goal.;Weight Management: Provide education and appropriate resources to help participant work on and attain dietary goals.;Weight Management/Obesity: Establish reasonable short term and long term weight goals.    Expected Outcomes Short Term: Continue to assess and modify interventions until short term weight is achieved;Long Term: Adherence to nutrition and physical activity/exercise program aimed toward attainment of established weight goal;Weight Maintenance: Understanding of the daily nutrition guidelines, which includes 25-35% calories from fat, 7% or less cal from saturated fats, less than 200mg  cholesterol, less than 1.5gm of sodium, & 5 or more servings of fruits and vegetables daily;Understanding recommendations for meals to include 15-35% energy as protein, 25-35% energy from fat, 35-60% energy from carbohydrates, less than 200mg  of dietary cholesterol, 20-35 gm of total fiber daily;Understanding of distribution of calorie intake throughout the day with the consumption of 4-5 meals/snacks    Hypertension Yes    Intervention Provide education on lifestyle modifcations including regular physical activity/exercise, weight management, moderate sodium  restriction and increased consumption of fresh fruit, vegetables, and low fat dairy, alcohol moderation, and smoking cessation.;Monitor prescription use compliance.    Expected Outcomes Short Term: Continued assessment and intervention until BP is < 140/72mm HG in hypertensive participants. < 130/40mm HG in hypertensive participants with diabetes, heart failure or chronic kidney disease.;Long Term: Maintenance of blood pressure at goal levels.    Lipids Yes    Intervention Provide education and support for participant on nutrition & aerobic/resistive exercise along with prescribed medications to achieve LDL 70mg , HDL >40mg .    Expected Outcomes Short Term: Participant states understanding of desired cholesterol values and is compliant with medications prescribed. Participant is following exercise prescription and nutrition guidelines.;Long Term: Cholesterol controlled with medications as prescribed, with individualized exercise RX and with personalized nutrition plan. Value goals: LDL < 70mg , HDL > 40 mg.             Education:Diabetes - Individual verbal and written instruction to review signs/symptoms  of diabetes, desired ranges of glucose level fasting, after meals and with exercise. Acknowledge that pre and post exercise glucose checks will be done for 3 sessions at entry of program.   Core Components/Risk Factors/Patient Goals Review:   Goals and Risk Factor Review     Row Name 03/21/23 0735             Core Components/Risk Factors/Patient Goals Review   Personal Goals Review Weight Management/Obesity;Hypertension       Review Keith Torres is down 20 pounds since he had is heart event. He is trying to maintain his weight currently. He is checking his blood pressure at home most of the days. His pressure is doing well in the program. He has no questions on his medications.       Expected Outcomes Short: continue to maintain weight. Long: maintain weight independently.                 Core Components/Risk Factors/Patient Goals at Discharge (Final Review):   Goals and Risk Factor Review - 03/21/23 0735       Core Components/Risk Factors/Patient Goals Review   Personal Goals Review Weight Management/Obesity;Hypertension    Review Keith Torres is down 20 pounds since he had is heart event. He is trying to maintain his weight currently. He is checking his blood pressure at home most of the days. His pressure is doing well in the program. He has no questions on his medications.    Expected Outcomes Short: continue to maintain weight. Long: maintain weight independently.             ITP Comments:  ITP Comments     Row Name 02/07/23 0908 02/26/23 1135 02/28/23 0755 03/07/23 1208 04/04/23 1256   ITP Comments Virtual Visit completed. Patient informed on EP and RD appointment and 6 Minute walk test. Patient also informed of patient health questionnaires on My Chart. Patient Verbalizes understanding. Visit diagnosis can be found in [facility]/12/[facility]23. Completed and gym orientation. Initial ITP created and sent for review to Dr. Bethann Punches, Medical Director. First full day of exercise!  Patient was oriented to gym and equipment including functions, settings, policies, and procedures.  Patient's individual exercise prescription and treatment plan were reviewed.  All starting workloads were established based on the results of the 6 minute walk test done at initial orientation visit.  The plan for exercise progression was also introduced and progression will be customized based on patient's performance and goals. 30 Day review completed. Medical Director ITP review done, changes made as directed, and signed approval by Medical Director.    new to program 30 Day review completed. Medical Director ITP review done, changes made as directed, and signed approval by Medical Director.            Comments:

## 2023-04-09 ENCOUNTER — Encounter: Payer: BC Managed Care – PPO | Attending: Internal Medicine | Admitting: *Deleted

## 2023-04-09 DIAGNOSIS — Z48812 Encounter for surgical aftercare following surgery on the circulatory system: Secondary | ICD-10-CM | POA: Insufficient documentation

## 2023-04-09 DIAGNOSIS — Z955 Presence of coronary angioplasty implant and graft: Secondary | ICD-10-CM | POA: Diagnosis present

## 2023-04-09 DIAGNOSIS — I213 ST elevation (STEMI) myocardial infarction of unspecified site: Secondary | ICD-10-CM

## 2023-04-09 DIAGNOSIS — I252 Old myocardial infarction: Secondary | ICD-10-CM | POA: Diagnosis not present

## 2023-04-09 NOTE — Progress Notes (Signed)
Daily Session Note  Patient Details  Name: Keith Torres. MRN: [id number] Date of Birth: 03-Mar-1966 Referring Provider:   Flowsheet Row Cardiac Rehab from 02/26/2023 in [facility] Cardiac and Pulmonary Rehab  Referring Provider Dr. Clotilde Dieter, DO       Encounter Date: 04/09/2023  Check In:  Session Check In - 04/09/23 0759       Check-In   Supervising physician immediately available to respond to emergencies See telemetry face sheet for immediately available ER MD    Location [facility]-Cardiac & Pulmonary Rehab    Staff Present Cora Collum, RN, BSN, CCRP;Jason Wallace Cullens, RDN, LDN;Joseph [place], RCP,RRT,BSRT;Kelly [place], BS, ACSM CEP, Exercise Physiologist    Virtual Visit No    Medication changes reported     No    Fall or balance concerns reported    No    Warm-up and Cool-down Performed on first and last piece of equipment    Resistance Training Performed Yes    VAD Patient? No    PAD/SET Patient? No      Pain Assessment   Currently in Pain? No/denies                Social History   Tobacco Use  Smoking Status Never  Smokeless Tobacco Former   Types: Chew   Quit date: 1999    Goals Met:  Independence with exercise equipment Exercise tolerated well No report of concerns or symptoms today  Goals Unmet:  Not Applicable  Comments: Pt able to follow exercise prescription today without complaint.  Will continue to monitor for progression.    Dr. Bethann Punches is Medical Director for [facility] Cardiac Rehabilitation.  Dr. Vida Rigger is Medical Director for [facility] Pulmonary Rehabilitation.

## 2023-04-11 ENCOUNTER — Encounter: Payer: BC Managed Care – PPO | Admitting: *Deleted

## 2023-04-13 ENCOUNTER — Encounter: Payer: BC Managed Care – PPO | Admitting: *Deleted

## 2023-04-13 DIAGNOSIS — Z48812 Encounter for surgical aftercare following surgery on the circulatory system: Secondary | ICD-10-CM | POA: Diagnosis not present

## 2023-04-13 DIAGNOSIS — I213 ST elevation (STEMI) myocardial infarction of unspecified site: Secondary | ICD-10-CM

## 2023-04-13 DIAGNOSIS — Z955 Presence of coronary angioplasty implant and graft: Secondary | ICD-10-CM

## 2023-04-13 NOTE — Progress Notes (Signed)
Daily Session Note  Patient Details  Name: Keith Torres. MRN: [id number] Date of Birth: 01/14/1966 Referring Provider:   Flowsheet Row Cardiac Rehab from 02/26/2023 in [facility] Cardiac and Pulmonary Rehab  Referring Provider Dr. Clotilde Dieter, DO       Encounter Date: 04/13/2023  Check In:  Session Check In - 04/13/23 0749       Check-In   Supervising physician immediately available to respond to emergencies See telemetry face sheet for immediately available ER MD    Location [facility]-Cardiac & Pulmonary Rehab    Staff Present Cora Collum, RN, BSN, CCRP;Noah Tickle, BS, Exercise Physiologist;Joseph [place], [place]    Virtual Visit No    Medication changes reported     No    Fall or balance concerns reported    No    Warm-up and Cool-down Performed on first and last piece of equipment    Resistance Training Performed Yes    VAD Patient? No    PAD/SET Patient? No      Pain Assessment   Currently in Pain? No/denies                Social History   Tobacco Use  Smoking Status Never  Smokeless Tobacco Former   Types: Chew   Quit date: 1999    Goals Met:  Independence with exercise equipment Exercise tolerated well No report of concerns or symptoms today  Goals Unmet:  Not Applicable  Comments: Pt able to follow exercise prescription today without complaint.  Will continue to monitor for progression.    Dr. Bethann Punches is Medical Director for [facility] Cardiac Rehabilitation.  Dr. Vida Rigger is Medical Director for [facility] Pulmonary Rehabilitation.

## 2023-04-16 ENCOUNTER — Encounter: Payer: BC Managed Care – PPO | Admitting: *Deleted

## 2023-04-16 DIAGNOSIS — Z48812 Encounter for surgical aftercare following surgery on the circulatory system: Secondary | ICD-10-CM | POA: Diagnosis not present

## 2023-04-16 DIAGNOSIS — Z955 Presence of coronary angioplasty implant and graft: Secondary | ICD-10-CM

## 2023-04-16 DIAGNOSIS — I213 ST elevation (STEMI) myocardial infarction of unspecified site: Secondary | ICD-10-CM

## 2023-04-16 NOTE — Progress Notes (Signed)
Daily Session Note  Patient Details  Name: Keith Torres. MRN: [id number] Date of Birth: 05-26-1966 Referring Provider:   Flowsheet Row Cardiac Rehab from 02/26/2023 in [facility] Cardiac and Pulmonary Rehab  Referring Provider Dr. Clotilde Dieter, DO       Encounter Date: 04/16/2023  Check In:  Session Check In - 04/16/23 0800       Check-In   Supervising physician immediately available to respond to emergencies See telemetry face sheet for immediately available ER MD    Location [facility]-Cardiac & Pulmonary Rehab    Staff Present Elige Ko, RCP,RRT,BSRT;Jason Wallace Cullens, RDN, Fonda Kinder, BS, ACSM CEP, Exercise Physiologist;Valton Schwartz Katrinka Blazing, RN, ADN    Virtual Visit No    Medication changes reported     No    Fall or balance concerns reported    No    Warm-up and Cool-down Performed on first and last piece of equipment    Resistance Training Performed Yes    VAD Patient? No    PAD/SET Patient? No      Pain Assessment   Currently in Pain? No/denies                Social History   Tobacco Use  Smoking Status Never  Smokeless Tobacco Former   Types: Chew   Quit date: 1999    Goals Met:  Independence with exercise equipment Exercise tolerated well No report of concerns or symptoms today Strength training completed today  Goals Unmet:  Not Applicable  Comments: Pt able to follow exercise prescription today without complaint.  Will continue to monitor for progression.    Dr. Bethann Punches is Medical Director for [facility] Cardiac Rehabilitation.  Dr. Vida Rigger is Medical Director for [facility] Pulmonary Rehabilitation.

## 2023-04-18 ENCOUNTER — Encounter: Payer: BC Managed Care – PPO | Admitting: *Deleted

## 2023-04-18 DIAGNOSIS — Z955 Presence of coronary angioplasty implant and graft: Secondary | ICD-10-CM

## 2023-04-18 DIAGNOSIS — I213 ST elevation (STEMI) myocardial infarction of unspecified site: Secondary | ICD-10-CM

## 2023-04-18 DIAGNOSIS — Z48812 Encounter for surgical aftercare following surgery on the circulatory system: Secondary | ICD-10-CM | POA: Diagnosis not present

## 2023-04-18 NOTE — Progress Notes (Signed)
Daily Session Note  Patient Details  Name: Keith Torres. MRN: [id number] Date of Birth: 09/19/1965 Referring Provider:   Flowsheet Row Cardiac Rehab from 02/26/2023 in [facility] Cardiac and Pulmonary Rehab  Referring Provider Dr. Clotilde Dieter, DO       Encounter Date: 04/18/2023  Check In:  Session Check In - 04/18/23 0749       Check-In   Supervising physician immediately available to respond to emergencies See telemetry face sheet for immediately available ER MD    Location [facility]-Cardiac & Pulmonary Rehab    Staff Present Ronette Deter, BS, Exercise Physiologist;Joseph Reino Kent, Guinevere Ferrari, RN, ADN;Maxon [employer], , Exercise Physiologist    Virtual Visit No    Medication changes reported     No    Fall or balance concerns reported    No    Warm-up and Cool-down Performed on first and last piece of equipment    Resistance Training Performed Yes    VAD Patient? No    PAD/SET Patient? No      Pain Assessment   Currently in Pain? No/denies                Social History   Tobacco Use  Smoking Status Never  Smokeless Tobacco Former   Types: Chew   Quit date: 1999    Goals Met:  Independence with exercise equipment Exercise tolerated well No report of concerns or symptoms today Strength training completed today  Goals Unmet:  Not Applicable  Comments: Pt able to follow exercise prescription today without complaint.  Will continue to monitor for progression.    Dr. Bethann Punches is Medical Director for [facility] Cardiac Rehabilitation.  Dr. Vida Rigger is Medical Director for [facility] Pulmonary Rehabilitation.

## 2023-04-20 ENCOUNTER — Encounter: Payer: BC Managed Care – PPO | Admitting: *Deleted

## 2023-04-20 DIAGNOSIS — Z955 Presence of coronary angioplasty implant and graft: Secondary | ICD-10-CM

## 2023-04-20 DIAGNOSIS — I213 ST elevation (STEMI) myocardial infarction of unspecified site: Secondary | ICD-10-CM

## 2023-04-20 DIAGNOSIS — Z48812 Encounter for surgical aftercare following surgery on the circulatory system: Secondary | ICD-10-CM | POA: Diagnosis not present

## 2023-04-20 NOTE — Progress Notes (Signed)
Daily Session Note  Patient Details  Name: Keith Torres. MRN: [id number] Date of Birth: September 01, 1965 Referring Provider:   Flowsheet Row Cardiac Rehab from 02/26/2023 in [facility] Cardiac and Pulmonary Rehab  Referring Provider Dr. Clotilde Dieter, DO       Encounter Date: 04/20/2023  Check In:  Session Check In - 04/20/23 7829       Check-In   Supervising physician immediately available to respond to emergencies See telemetry face sheet for immediately available ER MD    Location [facility]-Cardiac & Pulmonary Rehab    Staff Present Cora Collum, RN, BSN, CCRP;Noah Tickle, BS, Exercise Physiologist;Joseph [place], [place]    Virtual Visit No    Medication changes reported     No    Fall or balance concerns reported    No    Warm-up and Cool-down Performed on first and last piece of equipment    Resistance Training Performed Yes    VAD Patient? No    PAD/SET Patient? No      Pain Assessment   Currently in Pain? No/denies                Social History   Tobacco Use  Smoking Status Never  Smokeless Tobacco Former   Types: Chew   Quit date: 1999    Goals Met:  Independence with exercise equipment Exercise tolerated well No report of concerns or symptoms today  Goals Unmet:  Not Applicable  Comments: Pt able to follow exercise prescription today without complaint.  Will continue to monitor for progression.    Dr. Bethann Punches is Medical Director for [facility] Cardiac Rehabilitation.  Dr. Vida Rigger is Medical Director for [facility] Pulmonary Rehabilitation.

## 2023-04-23 ENCOUNTER — Encounter: Payer: BC Managed Care – PPO | Admitting: *Deleted

## 2023-04-23 DIAGNOSIS — Z48812 Encounter for surgical aftercare following surgery on the circulatory system: Secondary | ICD-10-CM | POA: Diagnosis not present

## 2023-04-23 DIAGNOSIS — Z955 Presence of coronary angioplasty implant and graft: Secondary | ICD-10-CM

## 2023-04-23 DIAGNOSIS — I213 ST elevation (STEMI) myocardial infarction of unspecified site: Secondary | ICD-10-CM

## 2023-04-23 NOTE — Progress Notes (Signed)
Daily Session Note  Patient Details  Name: Keith Torres. MRN: [id number] Date of Birth: 11/09/1965 Referring Provider:   Flowsheet Row Cardiac Rehab from 02/26/2023 in [facility] Cardiac and Pulmonary Rehab  Referring Provider Dr. Clotilde Dieter, DO       Encounter Date: 04/23/2023  Check In:  Session Check In - 04/23/23 0805       Check-In   Supervising physician immediately available to respond to emergencies See telemetry face sheet for immediately available ER MD    Location [facility]-Cardiac & Pulmonary Rehab    Staff Present Balinda Quails, RDN, LDN;Joseph Reino Kent, RCP,RRT,BSRT;Kelly Madilyn Fireman, BS, ACSM CEP, Exercise Physiologist;Jacyln Carmer Katrinka Blazing, RN, ADN    Virtual Visit No    Medication changes reported     No    Fall or balance concerns reported    No    Warm-up and Cool-down Performed on first and last piece of equipment    Resistance Training Performed Yes    VAD Patient? No    PAD/SET Patient? No      Pain Assessment   Currently in Pain? No/denies                Social History   Tobacco Use  Smoking Status Never  Smokeless Tobacco Former   Types: Chew   Quit date: 1999    Goals Met:  Independence with exercise equipment Exercise tolerated well No report of concerns or symptoms today Strength training completed today  Goals Unmet:  Not Applicable  Comments: Pt able to follow exercise prescription today without complaint.  Will continue to monitor for progression.    Dr. Bethann Punches is Medical Director for [facility] Cardiac Rehabilitation.  Dr. Vida Rigger is Medical Director for [facility] Pulmonary Rehabilitation.

## 2023-04-25 ENCOUNTER — Encounter: Payer: Self-pay | Admitting: *Deleted

## 2023-04-25 ENCOUNTER — Encounter: Payer: BC Managed Care – PPO | Admitting: *Deleted

## 2023-04-25 DIAGNOSIS — Z48812 Encounter for surgical aftercare following surgery on the circulatory system: Secondary | ICD-10-CM | POA: Diagnosis not present

## 2023-04-25 DIAGNOSIS — Z955 Presence of coronary angioplasty implant and graft: Secondary | ICD-10-CM

## 2023-04-25 DIAGNOSIS — I213 ST elevation (STEMI) myocardial infarction of unspecified site: Secondary | ICD-10-CM

## 2023-04-25 NOTE — Progress Notes (Signed)
Daily Session Note  Patient Details  Name: Keith Torres. MRN: [id number] Date of Birth: 02/09/1966 Referring Provider:   Flowsheet Row Cardiac Rehab from 02/26/2023 in [facility] Cardiac and Pulmonary Rehab  Referring Provider Dr. Clotilde Dieter, DO       Encounter Date: 04/25/2023  Check In:  Session Check In - 04/25/23 0844       Check-In   Supervising physician immediately available to respond to emergencies See telemetry face sheet for immediately available ER MD    Location [facility]-Cardiac & Pulmonary Rehab    Staff Present Rory Percy, MS, Exercise Physiologist;Joseph Reino Kent, RCP,RRT,BSRT;Maxon [place] BS, , Exercise Physiologist;Jasmina Gendron Katrinka Blazing, RN, ADN    Virtual Visit No    Medication changes reported     No    Fall or balance concerns reported    No    Warm-up and Cool-down Performed on first and last piece of equipment    Resistance Training Performed Yes    VAD Patient? No    PAD/SET Patient? No      Pain Assessment   Currently in Pain? No/denies                Social History   Tobacco Use  Smoking Status Never  Smokeless Tobacco Former   Types: Chew   Quit date: 1999    Goals Met:  Independence with exercise equipment Exercise tolerated well No report of concerns or symptoms today Strength training completed today  Goals Unmet:  Not Applicable  Comments: Pt able to follow exercise prescription today without complaint.  Will continue to monitor for progression.    Dr. Bethann Punches is Medical Director for [facility] Cardiac Rehabilitation.  Dr. Vida Rigger is Medical Director for [facility] Pulmonary Rehabilitation.

## 2023-04-25 NOTE — Progress Notes (Signed)
Cardiac Individual Treatment Plan  Patient Details  Name: Keith Torres. MRN: [id number] Date of Birth: Sep 18, 1965 Referring Provider:   Flowsheet Row Cardiac Rehab from 02/26/2023 in [facility] Cardiac and Pulmonary Rehab  Referring Provider Dr. Clotilde Dieter, DO       Initial Encounter Date:  Flowsheet Row Cardiac Rehab from 02/26/2023 in [facility] Cardiac and Pulmonary Rehab  Date 02/26/23       Visit Diagnosis: ST elevation myocardial infarction (STEMI), unspecified artery [facility])  Status post coronary artery stent placement  Patient's Home Medications on Admission:  Current Outpatient Medications:    apixaban (ELIQUIS) 5 MG TABS tablet, Take 1 tablet (5 mg total) by mouth 2 (two) times daily., Disp: 60 tablet, Rfl: 1   benzonatate (TESSALON) 200 MG capsule, Take 1 capsule (200 mg total) by mouth 3 (three) times daily as needed for cough. (Patient not taking: Reported on 03/15/2023), Disp: 30 capsule, Rfl: 0   diazepam (VALIUM) 10 MG tablet, 1 tab po 30 min prior to procedure.  Will need a driver to procedure with this medication (Patient not taking: Reported on 03/15/2023), Disp: 1 tablet, Rfl: 0   empagliflozin (JARDIANCE) 10 MG TABS tablet, Take 1 tablet by mouth daily. (Patient not taking: Reported on 03/15/2023), Disp: , Rfl:    furosemide (LASIX) 20 MG tablet, Take 1 tablet (20 mg total) by mouth daily., Disp: 30 tablet, Rfl: 1   meloxicam (MOBIC) 7.5 MG tablet, Take 1 tablet by mouth daily. (Patient not taking: Reported on 03/15/2023), Disp: , Rfl:    metoprolol tartrate (LOPRESSOR) 50 MG tablet, Take 1 tablet (50 mg total) by mouth 2 (two) times daily. (Patient taking differently: Take 100 mg by mouth 2 (two) times daily.), Disp: 60 tablet, Rfl: 1   metoprolol tartrate (LOPRESSOR) 50 MG tablet, Take 1 tablet by mouth 3 (three) times daily. (Patient not taking: Reported on 03/15/2023), Disp: , Rfl:    sacubitril-valsartan (ENTRESTO) 24-26 MG, Take 1 tablet by mouth 2 (two) times  daily., Disp: , Rfl:    simethicone (MYLICON) 80 MG chewable tablet, Chew 1 tablet (80 mg total) by mouth 4 (four) times daily. (Patient not taking: Reported on 03/15/2023), Disp: 30 tablet, Rfl: 0   simethicone (MYLICON) 80 MG chewable tablet, Chew by mouth., Disp: , Rfl:    Spacer/Aero Chamber Mouthpiece MISC, One spacer to use with albuterol inhaler., Disp: 1 each, Rfl: 0  Past Medical History: Past Medical History:  Diagnosis Date   Anxiety    Atrial fibrillation (HCC)    Dental crowns present    FRONT TWO TEETH   Deviated septum    Dysrhythmia    A-FIB/ DR Gwen Pounds   Hyperlipidemia    Hypertrophy of nasal turbinates    Meningitis    Obesity    Rhinitis, allergic    Seasonal allergies    Sleep apnea    C-PAP    Tobacco Use: Social History   Tobacco Use  Smoking Status Never  Smokeless Tobacco Former   Types: Chew   Quit date: 1999    Labs: Review Flowsheet       Latest Ref Rng & Units 04/10/2017 01/12/2023 01/13/2023  Labs for ITP Cardiac and Pulmonary Rehab  Cholestrol 0 - 200 mg/dL 782  956  213   LDL (calc) 0 - 99 mg/dL 086  578  77   HDL-C >46 mg/dL 52  51  54   Trlycerides <150 mg/dL 962  952  90   Hemoglobin A1c 4.8 -  5.6 % - - 5.4     Details             Exercise Target Goals: Exercise Program Goal: Individual exercise prescription set using results from initial 6 min walk test and THRR while considering  patient's activity barriers and safety.   Exercise Prescription Goal: Initial exercise prescription builds to 30-45 minutes a day of aerobic activity, 2-3 days per week.  Home exercise guidelines will be given to patient during program as part of exercise prescription that the participant will acknowledge.   Education: Aerobic Exercise: - Group verbal and visual presentation on the components of exercise prescription. Introduces F.I.T.T principle from ACSM for exercise prescriptions.  Reviews F.I.T.T. principles of aerobic exercise including  progression. Written material given at graduation. Flowsheet Row Cardiac Rehab from 02/26/2023 in [facility] Cardiac and Pulmonary Rehab  Education need identified 02/26/23       Education: Resistance Exercise: - Group verbal and visual presentation on the components of exercise prescription. Introduces F.I.T.T principle from ACSM for exercise prescriptions  Reviews F.I.T.T. principles of resistance exercise including progression. Written material given at graduation.    Education: Exercise & Equipment Safety: - Individual verbal instruction and demonstration of equipment use and safety with use of the equipment. Flowsheet Row Cardiac Rehab from 02/26/2023 in [facility] Cardiac and Pulmonary Rehab  Date 02/07/23  Educator [facility]  Instruction Review Code 1- Verbalizes Understanding       Education: Exercise Physiology & General Exercise Guidelines: - Group verbal and written instruction with models to review the exercise physiology of the cardiovascular system and associated critical values. Provides general exercise guidelines with specific guidelines to those with heart or lung disease.    Education: Flexibility, Balance, Mind/Body Relaxation: - Group verbal and visual presentation with interactive activity on the components of exercise prescription. Introduces F.I.T.T principle from ACSM for exercise prescriptions. Reviews F.I.T.T. principles of flexibility and balance exercise training including progression. Also discusses the mind body connection.  Reviews various relaxation techniques to help reduce and manage stress (i.e. Deep breathing, progressive muscle relaxation, and visualization). Balance handout provided to take home. Written material given at graduation.   Activity Barriers & Risk Stratification:  Activity Barriers & Cardiac Risk Stratification - 02/26/23 1139       Activity Barriers & Cardiac Risk Stratification   Activity Barriers Other (comment)    Comments Osteoarthritis knee,  Gout    Cardiac Risk Stratification Moderate             6 Minute Walk:  6 Minute Walk     Row Name 02/26/23 1136         6 Minute Walk   Phase Initial     Distance 1010 feet     Walk Time 6 minutes     # of Rest Breaks 0     MPH 1.91     METS 3.07     RPE 13     Perceived Dyspnea  2     VO2 Peak 10.75     Symptoms No     Resting HR 108 bpm     Resting BP 94/60     Resting Oxygen Saturation  98 %     Exercise Oxygen Saturation  during 6 min walk 96 %     Max Ex. HR 133 bpm     Max Ex. BP 108/66     2 Minute Post BP 98/64  Oxygen Initial Assessment:   Oxygen Re-Evaluation:   Oxygen Discharge (Final Oxygen Re-Evaluation):   Initial Exercise Prescription:  Initial Exercise Prescription - 02/26/23 1100       Date of Initial Exercise RX and Referring Provider   Date 02/26/23    Referring Provider Dr. Clotilde Dieter, DO      Oxygen   Maintain Oxygen Saturation 88% or higher      Treadmill   MPH 1.8    Grade 0    Minutes 15    METs 2.38      Recumbant Bike   Level 2    RPM 50    Watts 36    Minutes 15    METs 3.07      NuStep   Level 2    SPM 80    Minutes 15    METs 3.07      REL-XR   Level 2    Speed 50    Minutes 15    METs 3.07      Track   Laps 30    Minutes 15    METs 3.07      Prescription Details   Frequency (times per week) 3    Duration Progress to 30 minutes of continuous aerobic without signs/symptoms of physical distress      Intensity   THRR 40-80% of Max Heartrate 129-151    Ratings of Perceived Exertion 11-13    Perceived Dyspnea 0-4      Resistance Training   Training Prescription Yes    Weight 5 lb    Reps 10-15             Perform Capillary Blood Glucose checks as needed.  Exercise Prescription Changes:   Exercise Prescription Changes     Row Name 02/26/23 1100 03/15/23 0800 03/29/23 1400 04/12/23 1500       Response to Exercise   Blood Pressure (Admit) 94/60 110/72 116/62  110/68    Blood Pressure (Exercise) 108/66 128/58 124/68 122/72    Blood Pressure (Exit) 98/64 118/62 98/58 98/58     Heart Rate (Admit) 108 bpm 97 bpm 109 bpm 89 bpm    Heart Rate (Exercise) 133 bpm 131 bpm 134 bpm 121 bpm    Heart Rate (Exit) 122 bpm 105 bpm 108 bpm 84 bpm    Oxygen Saturation (Admit) 98 % -- -- --    Oxygen Saturation (Exercise) 96 % -- -- --    Rating of Perceived Exertion (Exercise) 13 12 13 13     Perceived Dyspnea (Exercise) 2 -- -- 0    Symptoms none none none none    Comments Results First two weeks of exercise -- --    Duration -- Progress to 30 minutes of  aerobic without signs/symptoms of physical distress Progress to 30 minutes of  aerobic without signs/symptoms of physical distress Progress to 30 minutes of  aerobic without signs/symptoms of physical distress    Intensity -- THRR unchanged THRR unchanged THRR unchanged      Progression   Progression -- Continue to progress workloads to maintain intensity without signs/symptoms of physical distress. Continue to progress workloads to maintain intensity without signs/symptoms of physical distress. Continue to progress workloads to maintain intensity without signs/symptoms of physical distress.    Average METs -- 3.3 3.34 3.9      Resistance Training   Training Prescription -- Yes Yes Yes    Weight -- 5 lb 5 lb 5 lb    Reps --  10-15 10-15 10-15      Interval Training   Interval Training -- No No No      Treadmill   MPH -- 3 3 3     Grade -- 0 1 0    Minutes -- 15 15 15     METs -- 3.3 3.71 3.3      Recumbant Bike   Level -- -- 2 --    Watts -- -- 36 --    Minutes -- -- 15 --    METs -- -- 3.07 --      NuStep   Level -- 5 4 4     Minutes -- 15 15 15     METs -- 4.5 3.7 4.2      REL-XR   Level -- 2 3  went up to level 4 9    Minutes -- 15 15 15     METs -- 4.2 2.6 5.3      T5 Nustep   Level -- -- 2 --    Minutes -- -- 15 --    METs -- -- 2.7 --      Oxygen   Maintain Oxygen Saturation --  88% or higher 88% or higher 88% or higher             Exercise Comments:   Exercise Comments     Row Name 02/28/23 0756           Exercise Comments First full day of exercise!  Patient was oriented to gym and equipment including functions, settings, policies, and procedures.  Patient's individual exercise prescription and treatment plan were reviewed.  All starting workloads were established based on the results of the 6 minute walk test done at initial orientation visit.  The plan for exercise progression was also introduced and progression will be customized based on patient's performance and goals.                Exercise Goals and Review:   Exercise Goals     Row Name 02/26/23 1139             Exercise Goals   Increase Physical Activity Yes       Intervention Provide advice, education, support and counseling about physical activity/exercise needs.;Develop an individualized exercise prescription for aerobic and resistive training based on initial evaluation findings, risk stratification, comorbidities and participant's personal goals.       Expected Outcomes Short Term: Attend rehab on a regular basis to increase amount of physical activity.;Long Term: Add in home exercise to make exercise part of routine and to increase amount of physical activity.;Long Term: Exercising regularly at least 3-5 days a week.       Increase Strength and Stamina Yes       Intervention Provide advice, education, support and counseling about physical activity/exercise needs.;Develop an individualized exercise prescription for aerobic and resistive training based on initial evaluation findings, risk stratification, comorbidities and participant's personal goals.       Expected Outcomes Short Term: Increase workloads from initial exercise prescription for resistance, speed, and METs.;Short Term: Perform resistance training exercises routinely during rehab and add in resistance training at  home;Long Term: Improve cardiorespiratory fitness, muscular endurance and strength as measured by increased METs and functional capacity ( )       Able to understand and use rate of perceived exertion (RPE) scale Yes       Intervention Provide education and explanation on how to use RPE scale       Expected  Outcomes Short Term: Able to use RPE daily in rehab to express subjective intensity level;Long Term:  Able to use RPE to guide intensity level when exercising independently       Able to understand and use Dyspnea scale Yes       Intervention Provide education and explanation on how to use Dyspnea scale       Expected Outcomes Short Term: Able to use Dyspnea scale daily in rehab to express subjective sense of shortness of breath during exertion;Long Term: Able to use Dyspnea scale to guide intensity level when exercising independently       Knowledge and understanding of Target Heart Rate Range (THRR) Yes       Intervention Provide education and explanation of THRR including how the numbers were predicted and where they are located for reference       Expected Outcomes Short Term: Able to state/look up THRR;Long Term: Able to use THRR to govern intensity when exercising independently;Short Term: Able to use daily as guideline for intensity in rehab       Able to check pulse independently Yes       Intervention Review the importance of being able to check your own pulse for safety during independent exercise;Provide education and demonstration on how to check pulse in carotid and radial arteries.       Expected Outcomes Short Term: Able to explain why pulse checking is important during independent exercise;Long Term: Able to check pulse independently and accurately       Understanding of Exercise Prescription Yes       Intervention Provide education, explanation, and written materials on patient's individual exercise prescription       Expected Outcomes Short Term: Able to explain program  exercise prescription;Long Term: Able to explain home exercise prescription to exercise independently                Exercise Goals Re-Evaluation :  Exercise Goals Re-Evaluation     Row Name 02/28/23 0756 03/15/23 0847 03/29/23 1417 04/12/23 1541       Exercise Goal Re-Evaluation   Exercise Goals Review Able to understand and use rate of perceived exertion (RPE) scale;Able to understand and use Dyspnea scale;Knowledge and understanding of Target Heart Rate Range (THRR);Understanding of Exercise Prescription Increase Physical Activity;Increase Strength and Stamina;Understanding of Exercise Prescription Increase Physical Activity;Increase Strength and Stamina;Understanding of Exercise Prescription Increase Physical Activity;Increase Strength and Stamina;Understanding of Exercise Prescription    Comments Reviewed RPE and dyspnea scale, THR and program prescription with pt today.  Pt voiced understanding and was given a copy of goals to take home. Sharlot Gowda is off to a good start in the program. He has completed his first 3 session dring this review. During these sessions he was able to increase his level on the T4 nustep from 2 to 5. He was also able to increase his speed on the treadmill from 1.8 mph to 3 mph. He also seems to be doing well with the 5 lb handweights. We will continue to monitor his progress in the program. Sharlot Gowda is doing well in rehab. He has recently been able to increase his level on the XR from level 2 to 3. He also added an incline of 1% to his treadmill workload. We will continue to monitor his progress in the program. Sharlot Gowda continues to make improvements in the program. He was able to increase his level on the XR from level 3 to level 9. He was also able to maintain his  intensity on the treadmill and T4 nustep. We will continue to monitor his progress in the program.    Expected Outcomes Short: Use RPE daily to regulate intensity. Long: Follow program prescription in THR. Short:  Continue to follow current exercise prescription. Long: Continue exercise to improve strength and stamina. Short: Continue to follow current exercise prescription. Long: Continue exercise to improve strength and stamina. Short: Continue to follow current exercise prescription. Long: Continue exercise to improve strength and stamina.             Discharge Exercise Prescription (Final Exercise Prescription Changes):  Exercise Prescription Changes - 04/12/23 1500       Response to Exercise   Blood Pressure (Admit) 110/68    Blood Pressure (Exercise) 122/72    Blood Pressure (Exit) 98/58    Heart Rate (Admit) 89 bpm    Heart Rate (Exercise) 121 bpm    Heart Rate (Exit) 84 bpm    Rating of Perceived Exertion (Exercise) 13    Perceived Dyspnea (Exercise) 0    Symptoms none    Duration Progress to 30 minutes of  aerobic without signs/symptoms of physical distress    Intensity THRR unchanged      Progression   Progression Continue to progress workloads to maintain intensity without signs/symptoms of physical distress.    Average METs 3.9      Resistance Training   Training Prescription Yes    Weight 5 lb    Reps 10-15      Interval Training   Interval Training No      Treadmill   MPH 3    Grade 0    Minutes 15    METs 3.3      NuStep   Level 4    Minutes 15    METs 4.2      REL-XR   Level 9    Minutes 15    METs 5.3      Oxygen   Maintain Oxygen Saturation 88% or higher             Nutrition:  Target Goals: Understanding of nutrition guidelines, daily intake of sodium 1500mg , cholesterol 200mg , calories 30% from fat and 7% or less from saturated fats, daily to have 5 or more servings of fruits and vegetables.  Education: All About Nutrition: -Group instruction provided by verbal, written material, interactive activities, discussions, models, and posters to present general guidelines for heart healthy nutrition including fat, fiber, MyPlate, the role of  sodium in heart healthy nutrition, utilization of the nutrition label, and utilization of this knowledge for meal planning. Follow up email sent as well. Written material given at graduation. Flowsheet Row Cardiac Rehab from 02/26/2023 in [facility] Cardiac and Pulmonary Rehab  Education need identified 02/26/23       Biometrics:  Pre Biometrics - 02/26/23 1140       Pre Biometrics   Height 6\' 2"  (1.88 m)    Weight 233 lb 8 oz (105.9 kg)    Waist Circumference 41.5 inches    Hip Circumference 43.5 inches    Waist to Hip Ratio 0.95 %    BMI (Calculated) 29.97    Single Leg Stand 10.1 seconds              Nutrition Therapy Plan and Nutrition Goals:  Nutrition Therapy & Goals - 02/26/23 1147       Intervention Plan   Intervention Prescribe, educate and counsel regarding individualized specific dietary modifications aiming towards targeted core components  such as weight, hypertension, lipid management, diabetes, heart failure and other comorbidities.    Expected Outcomes Short Term Goal: Understand basic principles of dietary content, such as calories, fat, sodium, cholesterol and nutrients.;Short Term Goal: A plan has been developed with personal nutrition goals set during dietitian appointment.;Long Term Goal: Adherence to prescribed nutrition plan.             Nutrition Assessments:  MEDIFICTS Score Key: >=70 Need to make dietary changes  40-70 Heart Healthy Diet <= 40 Therapeutic Level Cholesterol Diet  Flowsheet Row Cardiac Rehab from 02/26/2023 in [facility] Cardiac and Pulmonary Rehab  Picture Your Plate Total Score on Admission 47      Picture Your Plate Scores: <16 Unhealthy dietary pattern with much room for improvement. 41-50 Dietary pattern unlikely to meet recommendations for good health and room for improvement. 51-60 More healthful dietary pattern, with some room for improvement.  >60 Healthy dietary pattern, although there may be some specific behaviors that  could be improved.    Nutrition Goals Re-Evaluation:  Nutrition Goals Re-Evaluation     Row Name 03/19/23 0810 04/20/23 0750           Goals   Current Weight -- 237 lb (107.5 kg)      Nutrition Goal -- Eat more healthy      Comment He reports he was eating only veggies when he got out of the hospital becasue he knew meat was bad for him. Reports he is loves meat, and has gone back to eating it regularly. Spoke with him about not commiting to all veggies or all carbs or all meat diets, but rather make more balanced plates with smaller portions of lean meat with controlled portions of carbs and larger portions of non starchy veggies. Sharlot Gowda states that he over eats at times and wants to work on that. He eats his first plate because he was hungry then the second plate because it was good. He is going to rty to cut back on portion sizes.      Expected Outcome Short: work on reducing portions of meat, choosing leaner cuts, and including more colorful produce on plates. Long: maintain a heart healthy diet with balanced plates Short: work on portion sizes. Long: maintain a caloric intake that works for him.               Nutrition Goals Discharge (Final Nutrition Goals Re-Evaluation):  Nutrition Goals Re-Evaluation - 04/20/23 0750       Goals   Current Weight 237 lb (107.5 kg)    Nutrition Goal Eat more healthy    Comment Sharlot Gowda states that he over eats at times and wants to work on that. He eats his first plate because he was hungry then the second plate because it was good. He is going to rty to cut back on portion sizes.    Expected Outcome Short: work on portion sizes. Long: maintain a caloric intake that works for him.             Psychosocial: Target Goals: Acknowledge presence or absence of significant depression and/or stress, maximize coping skills, provide positive support system. Participant is able to verbalize types and ability to use techniques and skills needed for  reducing stress and depression.   Education: Stress, Anxiety, and Depression - Group verbal and visual presentation to define topics covered.  Reviews how body is impacted by stress, anxiety, and depression.  Also discusses healthy ways to reduce stress and to treat/manage anxiety  and depression.  Written material given at graduation. Flowsheet Row Cardiac Rehab from 02/26/2023 in [facility] Cardiac and Pulmonary Rehab  Education need identified 02/26/23       Education: Sleep Hygiene -Provides group verbal and written instruction about how sleep can affect your health.  Define sleep hygiene, discuss sleep cycles and impact of sleep habits. Review good sleep hygiene tips.    Initial Review & Psychosocial Screening:  Initial Psych Review & Screening - 02/07/23 0910       Initial Review   Current issues with None Identified      Family Dynamics   Good Support System? Yes    Comments He can look to his wife and  his son for support.  He has a good outlook on his health and is ready to start rehab .      Barriers   Psychosocial barriers to participate in program There are no identifiable barriers or psychosocial needs.;The patient should benefit from training in stress management and relaxation.      Screening Interventions   Interventions Encouraged to exercise;To provide support and resources with identified psychosocial needs;Provide feedback about the scores to participant    Expected Outcomes Short Term goal: Utilizing psychosocial counselor, staff and physician to assist with identification of specific Stressors or current issues interfering with healing process. Setting desired goal for each stressor or current issue identified.;Long Term Goal: Stressors or current issues are controlled or eliminated.;Long Term goal: The participant improves quality of Life and PHQ9 Scores as seen by post scores and/or verbalization of changes;Short Term goal: Identification and review with participant of  any Quality of Life or Depression concerns found by scoring the questionnaire.             Quality of Life Scores:   Quality of Life - 02/26/23 1146       Quality of Life   Select Quality of Life      Quality of Life Scores   Health/Function Pre 18.9 %    Socioeconomic Pre 30 %    Psych/Spiritual Pre 28.07 %    Family Pre 27.6 %    GLOBAL Pre 24.18 %            Scores of 19 and below usually indicate a poorer quality of life in these areas.  A difference of  2-3 points is a clinically meaningful difference.  A difference of 2-3 points in the total score of the Quality of Life Index has been associated with significant improvement in overall quality of life, self-image, physical symptoms, and general health in studies assessing change in quality of life.  PHQ-9: Review Flowsheet       03/21/2023 02/26/2023  Depression screen PHQ 2/9  Decreased Interest 0 0  Down, Depressed, Hopeless 0 0  PHQ - 2 Score 0 0  Altered sleeping 0 1  Tired, decreased energy 3 3  Change in appetite 0 0  Feeling bad or failure about yourself  0 0  Trouble concentrating 0 0  Moving slowly or fidgety/restless 0 1  Suicidal thoughts 0 0  PHQ-9 Score 3 5  Difficult doing work/chores Not difficult at all Very difficult    Details           Interpretation of Total Score  Total Score Depression Severity:  1-4 = Minimal depression, 5-9 = Mild depression, 10-14 = Moderate depression, 15-19 = Moderately severe depression, 20-27 = Severe depression   Psychosocial Evaluation and Intervention:  Psychosocial Evaluation - 02/07/23  1324       Psychosocial Evaluation & Interventions   Interventions Relaxation education;Stress management education;Encouraged to exercise with the program and follow exercise prescription    Comments He can look to his wife and his son for support. He has a good outlook on his health and is ready to start rehab .    Expected Outcomes Short: Start [facility] to help  with mood. Long: Maintain a healthy mental state    Continue Psychosocial Services  Follow up required by staff             Psychosocial Re-Evaluation:  Psychosocial Re-Evaluation     Row Name 03/21/23 0738 04/20/23 0748           Psychosocial Re-Evaluation   Current issues with None Identified None Identified      Comments Reviewed patient health questionnaire (PHQ-9) with patient for follow up. Previously, patients score indicated signs/symptoms of depression.  Reviewed to see if patient is improving symptom wise while in program.  Score improved and patient states that it is because he has been able to go back to work. Patient reports no issues with their current mental states, sleep, stress, depression or anxiety. Will follow up with patient in a few weeks for any changes.      Expected Outcomes Short: Continue to attend [facility] regularly for regular exercise and social engagement. Long: Continue to improve symptoms and manage a positive mental state. Short: Continue to exercise regularly to support mental health and notify staff of any changes. Long: maintain mental health and well being through teaching of rehab or prescribed medications independently.      Interventions Encouraged to attend Cardiac Rehabilitation for the exercise Encouraged to attend Cardiac Rehabilitation for the exercise      Continue Psychosocial Services  Follow up required by staff Follow up required by staff               Psychosocial Discharge (Final Psychosocial Re-Evaluation):  Psychosocial Re-Evaluation - 04/20/23 0748       Psychosocial Re-Evaluation   Current issues with None Identified    Comments Patient reports no issues with their current mental states, sleep, stress, depression or anxiety. Will follow up with patient in a few weeks for any changes.    Expected Outcomes Short: Continue to exercise regularly to support mental health and notify staff of any changes. Long: maintain mental  health and well being through teaching of rehab or prescribed medications independently.    Interventions Encouraged to attend Cardiac Rehabilitation for the exercise    Continue Psychosocial Services  Follow up required by staff             Vocational Rehabilitation: Provide vocational rehab assistance to qualifying candidates.   Vocational Rehab Evaluation & Intervention:   Education: Education Goals: Education classes will be provided on a variety of topics geared toward better understanding of heart health and risk factor modification. Participant will state understanding/return demonstration of topics presented as noted by education test scores.  Learning Barriers/Preferences:  Learning Barriers/Preferences - 02/07/23 4010       Learning Barriers/Preferences   Learning Barriers None    Learning Preferences None             General Cardiac Education Topics:  AED/CPR: - Group verbal and written instruction with the use of models to demonstrate the basic use of the AED with the basic ABC's of resuscitation.   Anatomy and Cardiac Procedures: - Group verbal and visual presentation and  models provide information about basic cardiac anatomy and function. Reviews the testing methods done to diagnose heart disease and the outcomes of the test results. Describes the treatment choices: Medical Management, Angioplasty, or Coronary Bypass Surgery for treating various heart conditions including Myocardial Infarction, Angina, Valve Disease, and Cardiac Arrhythmias.  Written material given at graduation. Flowsheet Row Cardiac Rehab from 02/26/2023 in [facility] Cardiac and Pulmonary Rehab  Education need identified 02/26/23       Medication Safety: - Group verbal and visual instruction to review commonly prescribed medications for heart and lung disease. Reviews the medication, class of the drug, and side effects. Includes the steps to properly store meds and maintain the prescription  regimen.  Written material given at graduation.   Intimacy: - Group verbal instruction through game format to discuss how heart and lung disease can affect sexual intimacy. Written material given at graduation..   Know Your Numbers and Heart Failure: - Group verbal and visual instruction to discuss disease risk factors for cardiac and pulmonary disease and treatment options.  Reviews associated critical values for Overweight/Obesity, Hypertension, Cholesterol, and Diabetes.  Discusses basics of heart failure: signs/symptoms and treatments.  Introduces Heart Failure Zone chart for action plan for heart failure.  Written material given at graduation.   Infection Prevention: - Provides verbal and written material to individual with discussion of infection control including proper hand washing and proper equipment cleaning during exercise session. Flowsheet Row Cardiac Rehab from 02/26/2023 in [facility] Cardiac and Pulmonary Rehab  Date 02/07/23  Educator [facility]  Instruction Review Code 1- Verbalizes Understanding       Falls Prevention: - Provides verbal and written material to individual with discussion of falls prevention and safety. Flowsheet Row Cardiac Rehab from 02/26/2023 in [facility] Cardiac and Pulmonary Rehab  Date 02/07/23  Educator [facility]  Instruction Review Code 1- Verbalizes Understanding       Other: -Provides group and verbal instruction on various topics (see comments)   Knowledge Questionnaire Score:  Knowledge Questionnaire Score - 02/26/23 1147       Knowledge Questionnaire Score   Pre Score 17/26             Core Components/Risk Factors/Patient Goals at Admission:  Personal Goals and Risk Factors at Admission - 02/07/23 0909       Core Components/Risk Factors/Patient Goals on Admission    Weight Management Yes;Weight Maintenance    Intervention Weight Management: Develop a combined nutrition and exercise program designed to reach desired caloric intake, while  maintaining appropriate intake of nutrient and fiber, sodium and fats, and appropriate energy expenditure required for the weight goal.;Weight Management: Provide education and appropriate resources to help participant work on and attain dietary goals.;Weight Management/Obesity: Establish reasonable short term and long term weight goals.    Expected Outcomes Short Term: Continue to assess and modify interventions until short term weight is achieved;Long Term: Adherence to nutrition and physical activity/exercise program aimed toward attainment of established weight goal;Weight Maintenance: Understanding of the daily nutrition guidelines, which includes 25-35% calories from fat, 7% or less cal from saturated fats, less than 200mg  cholesterol, less than 1.5gm of sodium, & 5 or more servings of fruits and vegetables daily;Understanding recommendations for meals to include 15-35% energy as protein, 25-35% energy from fat, 35-60% energy from carbohydrates, less than 200mg  of dietary cholesterol, 20-35 gm of total fiber daily;Understanding of distribution of calorie intake throughout the day with the consumption of 4-5 meals/snacks    Hypertension Yes    Intervention Provide  education on lifestyle modifcations including regular physical activity/exercise, weight management, moderate sodium restriction and increased consumption of fresh fruit, vegetables, and low fat dairy, alcohol moderation, and smoking cessation.;Monitor prescription use compliance.    Expected Outcomes Short Term: Continued assessment and intervention until BP is < 140/62mm HG in hypertensive participants. < 130/67mm HG in hypertensive participants with diabetes, heart failure or chronic kidney disease.;Long Term: Maintenance of blood pressure at goal levels.    Lipids Yes    Intervention Provide education and support for participant on nutrition & aerobic/resistive exercise along with prescribed medications to achieve LDL 70mg , HDL >40mg .     Expected Outcomes Short Term: Participant states understanding of desired cholesterol values and is compliant with medications prescribed. Participant is following exercise prescription and nutrition guidelines.;Long Term: Cholesterol controlled with medications as prescribed, with individualized exercise RX and with personalized nutrition plan. Value goals: LDL < 70mg , HDL > 40 mg.             Education:Diabetes - Individual verbal and written instruction to review signs/symptoms of diabetes, desired ranges of glucose level fasting, after meals and with exercise. Acknowledge that pre and post exercise glucose checks will be done for 3 sessions at entry of program.   Core Components/Risk Factors/Patient Goals Review:   Goals and Risk Factor Review     Row Name 03/21/23 0735 04/20/23 0756           Core Components/Risk Factors/Patient Goals Review   Personal Goals Review Weight Management/Obesity;Hypertension Other      Review Sharlot Gowda is down 20 pounds since he had is heart event. He is trying to maintain his weight currently. He is checking his blood pressure at home most of the days. His pressure is doing well in the program. He has no questions on his medications. Sharlot Gowda is going to to his doctors appointment in the upcoming week for Cardioversion. He cannot tell that he is in afib and states that he may need a defribulator or pacemaker. He is doing well with his rehab and is working our routinely.      Expected Outcomes Short: continue to maintain weight. Long: maintain weight independently. Short: go to doctors appointment for Cardioversion. Long: maintain workout routine and cardiologist appointments               Core Components/Risk Factors/Patient Goals at Discharge (Final Review):   Goals and Risk Factor Review - 04/20/23 0756       Core Components/Risk Factors/Patient Goals Review   Personal Goals Review Other    Review Sharlot Gowda is going to to his doctors appointment in the  upcoming week for Cardioversion. He cannot tell that he is in afib and states that he may need a defribulator or pacemaker. He is doing well with his rehab and is working our routinely.    Expected Outcomes Short: go to doctors appointment for Cardioversion. Long: maintain workout routine and cardiologist appointments             ITP Comments:  ITP Comments     Row Name 02/07/23 0908 02/26/23 1135 02/28/23 0755 03/07/23 1208 04/04/23 1256   ITP Comments Virtual Visit completed. Patient informed on EP and RD appointment and 6 Minute walk test. Patient also informed of patient health questionnaires on My Chart. Patient Verbalizes understanding. Visit diagnosis can be found in [facility] 01/12/2023. Completed and gym orientation. Initial ITP created and sent for review to Dr. Bethann Punches, Medical Director. First full day of exercise!  Patient was  oriented to gym and equipment including functions, settings, policies, and procedures.  Patient's individual exercise prescription and treatment plan were reviewed.  All starting workloads were established based on the results of the 6 minute walk test done at initial orientation visit.  The plan for exercise progression was also introduced and progression will be customized based on patient's performance and goals. 30 Day review completed. Medical Director ITP review done, changes made as directed, and signed approval by Medical Director.    new to program 30 Day review completed. Medical Director ITP review done, changes made as directed, and signed approval by Medical Director.    Row Name 04/25/23 1144           ITP Comments 30 Day review completed. Medical Director ITP review done, changes made as directed, and signed approval by Medical Director.                Comments:

## 2023-04-30 ENCOUNTER — Encounter: Payer: BC Managed Care – PPO | Admitting: *Deleted

## 2023-04-30 DIAGNOSIS — Z955 Presence of coronary angioplasty implant and graft: Secondary | ICD-10-CM

## 2023-04-30 DIAGNOSIS — Z48812 Encounter for surgical aftercare following surgery on the circulatory system: Secondary | ICD-10-CM | POA: Diagnosis not present

## 2023-04-30 DIAGNOSIS — I213 ST elevation (STEMI) myocardial infarction of unspecified site: Secondary | ICD-10-CM

## 2023-04-30 NOTE — Progress Notes (Signed)
Daily Session Note  Patient Details  Name: Keith Torres. MRN: [id number] Date of Birth: 1965/08/07 Referring Provider:   Flowsheet Row Cardiac Rehab from 02/26/2023 in [facility] Cardiac and Pulmonary Rehab  Referring Provider Dr. Clotilde Dieter, DO       Encounter Date: 04/30/2023  Check In:  Session Check In - 04/30/23 0748       Check-In   Supervising physician immediately available to respond to emergencies See telemetry face sheet for immediately available ER MD    Location [facility]-Cardiac & Pulmonary Rehab    Staff Present Elige Ko, RCP,RRT,BSRT;Jason Wallace Cullens, RDN, Fonda Kinder, BS, ACSM CEP, Exercise Physiologist;Erby Sanderson Katrinka Blazing, RN, ADN    Virtual Visit No    Medication changes reported     No    Fall or balance concerns reported    No    Warm-up and Cool-down Performed on first and last piece of equipment    Resistance Training Performed Yes    VAD Patient? No    PAD/SET Patient? No      Pain Assessment   Currently in Pain? No/denies                Social History   Tobacco Use  Smoking Status Never  Smokeless Tobacco Former   Types: Chew   Quit date: 1999    Goals Met:  Independence with exercise equipment Exercise tolerated well No report of concerns or symptoms today Strength training completed today  Goals Unmet:  Not Applicable  Comments: Pt able to follow exercise prescription today without complaint.  Will continue to monitor for progression.    Dr. Bethann Punches is Medical Director for [facility] Cardiac Rehabilitation.  Dr. Vida Rigger is Medical Director for [facility] Pulmonary Rehabilitation.

## 2023-05-02 ENCOUNTER — Encounter: Payer: BC Managed Care – PPO | Admitting: *Deleted

## 2023-05-02 DIAGNOSIS — Z48812 Encounter for surgical aftercare following surgery on the circulatory system: Secondary | ICD-10-CM | POA: Diagnosis not present

## 2023-05-02 DIAGNOSIS — I213 ST elevation (STEMI) myocardial infarction of unspecified site: Secondary | ICD-10-CM

## 2023-05-02 DIAGNOSIS — Z955 Presence of coronary angioplasty implant and graft: Secondary | ICD-10-CM

## 2023-05-02 NOTE — Progress Notes (Signed)
Daily Session Note  Patient Details  Name: Keith Torres. MRN: [id number] Date of Birth: 1965-10-08 Referring Provider:   Flowsheet Row Cardiac Rehab from 02/26/2023 in [facility] Cardiac and Pulmonary Rehab  Referring Provider Dr. Clotilde Dieter, DO       Encounter Date: 05/02/2023  Check In:  Session Check In - 05/02/23 0744       Check-In   Supervising physician immediately available to respond to emergencies See telemetry face sheet for immediately available ER MD    Location [facility]-Cardiac & Pulmonary Rehab    Staff Present Cora Collum, RN, BSN, CCRP;Margaret Best, MS, Exercise Physiologist;Maxon Conetta BS, , Exercise Physiologist;Bethanie Bloxom Katrinka Blazing, RN, ADN    Virtual Visit No    Medication changes reported     No    Fall or balance concerns reported    No    Warm-up and Cool-down Performed on first and last piece of equipment    Resistance Training Performed Yes    VAD Patient? No    PAD/SET Patient? No      Pain Assessment   Currently in Pain? No/denies                Social History   Tobacco Use  Smoking Status Never  Smokeless Tobacco Former   Types: Chew   Quit date: 1999    Goals Met:  Independence with exercise equipment Exercise tolerated well No report of concerns or symptoms today Strength training completed today  Goals Unmet:  Not Applicable  Comments: Pt able to follow exercise prescription today without complaint.  Will continue to monitor for progression.    Dr. Bethann Punches is Medical Director for [facility] Cardiac Rehabilitation.  Dr. Vida Rigger is Medical Director for [facility] Pulmonary Rehabilitation.

## 2023-05-04 ENCOUNTER — Ambulatory Visit: Payer: BC Managed Care – PPO | Admitting: Anesthesiology

## 2023-05-04 ENCOUNTER — Other Ambulatory Visit: Payer: Self-pay

## 2023-05-04 ENCOUNTER — Encounter
Admission: RE | Disposition: A | Discharge status: Home / Self Care | Payer: Self-pay | Source: Home / Self Care | Attending: Internal Medicine

## 2023-05-04 ENCOUNTER — Ambulatory Visit
Admission: RE | Admit: 2023-05-04 | Discharge: 2023-05-04 | Disposition: A | Discharge status: Home / Self Care | Payer: BC Managed Care – PPO | Attending: Internal Medicine | Admitting: Internal Medicine

## 2023-05-04 ENCOUNTER — Encounter: Payer: Self-pay | Admitting: Internal Medicine

## 2023-05-04 DIAGNOSIS — Z7984 Long term (current) use of oral hypoglycemic drugs: Secondary | ICD-10-CM | POA: Diagnosis not present

## 2023-05-04 DIAGNOSIS — Z955 Presence of coronary angioplasty implant and graft: Secondary | ICD-10-CM | POA: Diagnosis not present

## 2023-05-04 DIAGNOSIS — I4891 Unspecified atrial fibrillation: Secondary | ICD-10-CM | POA: Diagnosis not present

## 2023-05-04 DIAGNOSIS — Z7901 Long term (current) use of anticoagulants: Secondary | ICD-10-CM | POA: Insufficient documentation

## 2023-05-04 DIAGNOSIS — Z7902 Long term (current) use of antithrombotics/antiplatelets: Secondary | ICD-10-CM | POA: Insufficient documentation

## 2023-05-04 DIAGNOSIS — I5022 Chronic systolic (congestive) heart failure: Secondary | ICD-10-CM | POA: Insufficient documentation

## 2023-05-04 DIAGNOSIS — E785 Hyperlipidemia, unspecified: Secondary | ICD-10-CM | POA: Insufficient documentation

## 2023-05-04 DIAGNOSIS — G4730 Sleep apnea, unspecified: Secondary | ICD-10-CM | POA: Insufficient documentation

## 2023-05-04 DIAGNOSIS — I480 Paroxysmal atrial fibrillation: Secondary | ICD-10-CM | POA: Diagnosis present

## 2023-05-04 DIAGNOSIS — Z01810 Encounter for preprocedural cardiovascular examination: Secondary | ICD-10-CM | POA: Diagnosis not present

## 2023-05-04 DIAGNOSIS — I4819 Other persistent atrial fibrillation: Secondary | ICD-10-CM

## 2023-05-04 DIAGNOSIS — Z79899 Other long term (current) drug therapy: Secondary | ICD-10-CM | POA: Diagnosis not present

## 2023-05-04 DIAGNOSIS — I2510 Atherosclerotic heart disease of native coronary artery without angina pectoris: Secondary | ICD-10-CM | POA: Diagnosis not present

## 2023-05-04 HISTORY — PX: CARDIOVERSION: SHX1299

## 2023-05-04 SURGERY — CARDIOVERSION
Anesthesia: General

## 2023-05-04 MED ORDER — SODIUM CHLORIDE 0.9 % IV SOLN: INTRAVENOUS | Status: DC

## 2023-05-04 MED ORDER — PROPOFOL 10 MG/ML IV BOLUS
INTRAVENOUS | Status: AC
  Filled 2023-05-04: qty 20

## 2023-05-04 MED ORDER — PROPOFOL 10 MG/ML IV BOLUS
INTRAVENOUS | Status: DC | PRN
  Administered 2023-05-04: 60 mg via INTRAVENOUS

## 2023-05-04 NOTE — Transfer of Care (Signed)
Immediate Anesthesia Transfer of Care Note  Patient: Keith Torres.  Procedure(s) Performed: CARDIOVERSION  Patient Location: PACU [facility] Anesthesia Type:General  Level of Consciousness: drowsy  Airway & Oxygen Therapy: Patient Spontanous Breathing and Patient connected to nasal cannula oxygen  Post-op Assessment: Report given to RN and Post -op Vital signs reviewed and stable  Post vital signs: Reviewed and stable  Last Vitals:  Vitals Value Taken Time  BP 125/82 05/04/23 0747  Temp    Pulse 57 05/04/23 0752  Resp 16 05/04/23 0752  SpO2 96 % 05/04/23 0752  Vitals shown include unfiled device data.  Last Pain:  Vitals:   05/04/23 0657  TempSrc: Oral  PainSc: 0-No pain         Complications: There were no known notable events for this encounter.

## 2023-05-04 NOTE — Anesthesia Postprocedure Evaluation (Signed)
Anesthesia Post Note  Patient: Keith Torres.  Procedure(s) Performed: CARDIOVERSION  Patient location during evaluation: PACU Anesthesia Type: General Level of consciousness: awake and alert Pain management: pain level controlled Vital Signs Assessment: post-procedure vital signs reviewed and stable Respiratory status: spontaneous breathing, nonlabored ventilation and respiratory function stable Cardiovascular status: blood pressure returned to baseline and stable Postop Assessment: no apparent nausea or vomiting Anesthetic complications: no   There were no known notable events for this encounter.   Last Vitals:  Vitals:   05/04/23 0751 05/04/23 0800  BP: 119/74 106/76  Pulse:  60  Resp: 13 15  Temp:    SpO2: 98% 96%    Last Pain:  Vitals:   05/04/23 0800  TempSrc:   PainSc: 0-No pain                 Foye Deer

## 2023-05-04 NOTE — Addendum Note (Signed)
Addendum  created 05/04/23 0840 by Foye Deer, MD   Clinical Note Signed

## 2023-05-04 NOTE — Anesthesia Preprocedure Evaluation (Addendum)
Anesthesia Evaluation  Patient identified by MRN, date of birth, ID band Patient awake    Reviewed: Allergy & Precautions, H&P , NPO status , Patient's Chart, lab work & pertinent test results, reviewed documented beta blocker date and time   Airway Mallampati: III   Neck ROM: full    Dental  (+) Poor Dentition   Pulmonary sleep apnea    Pulmonary exam normal        Cardiovascular Exercise Tolerance: Good + CAD, + Past MI (01/2023), + Cardiac Stents and +CHF (chronic HFpEF)  + dysrhythmias Atrial Fibrillation  Rhythm:Irregular Rate:Normal     Neuro/Psych   Anxiety     negative neurological ROS  negative psych ROS   GI/Hepatic negative GI ROS, Neg liver ROS,,,  Endo/Other  negative endocrine ROS    Renal/GU Renal disease  negative genitourinary   Musculoskeletal   Abdominal Normal abdominal exam  (+)   Peds  Hematology negative hematology ROS (+)   Anesthesia Other Findings Past Medical History: No date: Anxiety No date: Atrial fibrillation (HCC) No date: Dental crowns present     Comment:  FRONT TWO TEETH No date: Deviated septum No date: Dysrhythmia     Comment:  A-FIB/ DR Gwen Pounds No date: Hyperlipidemia No date: Hypertrophy of nasal turbinates No date: Meningitis No date: Obesity No date: Rhinitis, allergic No date: Seasonal allergies No date: Sleep apnea     Comment:  C-PAP Past Surgical History: 06/05/1972: APPENDECTOMY 06/05/2013: BACK SURGERY No date: COLONOSCOPY 10/08/2015: COLONOSCOPY WITH PROPOFOL; N/A     Comment:  Procedure: COLONOSCOPY WITH PROPOFOL;  Surgeon: Scot Jun, MD;  Location: [facility] ENDOSCOPY;  Service:               Endoscopy;  Laterality: N/A; No date: CORONARY ANGIOPLASTY WITH STENT PLACEMENT 01/12/2023: CORONARY/GRAFT ACUTE MI REVASCULARIZATION; N/A     Comment:  Procedure: Coronary/Graft Acute MI Revascularization;                Surgeon: Yvonne Kendall,  MD;  Location: [facility] INVASIVE               CV LAB;  Service: Cardiovascular;  Laterality: N/A; 01/17/2001: HERNIA REPAIR; Right     Comment:  Indirect hernia, Prolene hernia system. 03/23/2014: HERNIA REPAIR; Left     Comment:  Left indirect inguinal hernia repair, large ultra Pro               mesh 01/12/2023: LEFT HEART CATH AND CORONARY ANGIOGRAPHY; N/A     Comment:  Procedure: LEFT HEART CATH AND CORONARY ANGIOGRAPHY;                Surgeon: Yvonne Kendall, MD;  Location: [facility] INVASIVE               CV LAB;  Service: Cardiovascular;  Laterality: N/A; 05/21/2015: NASAL TURBINATE REDUCTION; Bilateral     Comment:  Procedure: TURBINATE REDUCTION/SUBMUCOSAL RESECTION;                Surgeon: Linus Salmons, MD;  Location: [facility] SURGERY               CNTR;  Service: ENT;  Laterality: Bilateral; 05/21/2015: SEPTOPLASTY; N/A     Comment:  Procedure: SEPTOPLASTY;  Surgeon: Linus Salmons, MD;                Location: [facility] SURGERY [facility];  Service: ENT;  Laterality: N/A;  C-PAP BMI    Body Mass Index: 29.27 kg/m     Reproductive/Obstetrics negative OB ROS                             Anesthesia Physical Anesthesia Plan  ASA: 3  Anesthesia Plan: General   Post-op Pain Management: Minimal or no pain anticipated   Induction: Intravenous  PONV Risk Score and Plan: TIVA  Airway Management Planned: Natural Airway  Additional Equipment:   Intra-op Plan:   Post-operative Plan:   Informed Consent: I have reviewed the patients History and Physical, chart, labs and discussed the procedure including the risks, benefits and alternatives for the proposed anesthesia with the patient or authorized representative who has indicated his/her understanding and acceptance.     Dental Advisory Given  Plan Discussed with: CRNA  Anesthesia Plan Comments:        Anesthesia Quick Evaluation

## 2023-05-04 NOTE — CV Procedure (Signed)
Electrical Cardioversion Procedure Note   Procedure: Electrical Cardioversion Indications:  Atrial Fibrillation  Procedure Details Consent: Risks of procedure as well as the alternatives and risks of each were explained to the (patient/caregiver).  Consent for procedure obtained. Time Out: Verified patient identification, verified procedure, site/side was marked, verified correct patient position, special equipment/implants available, medications/allergies/relevent history reviewed, required imaging and test results available.  Performed  Patient placed on cardiac monitor, pulse oximetry, supplemental oxygen as necessary.  Sedation given:  Propofol as per anesthesia Pacer pads placed anterior and posterior chest.  Cardioverted 1 time(s).  Cardioverted at 120J.  Evaluation Findings: Post procedure EKG shows: NSR Complications: None Patient did tolerate procedure well.   Dorothyann Peng MD 05/04/23

## 2023-05-05 ENCOUNTER — Encounter: Payer: Self-pay | Admitting: Internal Medicine

## 2023-05-07 ENCOUNTER — Encounter: Payer: BC Managed Care – PPO | Attending: Internal Medicine | Admitting: *Deleted

## 2023-05-07 DIAGNOSIS — I213 ST elevation (STEMI) myocardial infarction of unspecified site: Secondary | ICD-10-CM

## 2023-05-07 DIAGNOSIS — Z955 Presence of coronary angioplasty implant and graft: Secondary | ICD-10-CM | POA: Diagnosis present

## 2023-05-07 DIAGNOSIS — I252 Old myocardial infarction: Secondary | ICD-10-CM | POA: Diagnosis not present

## 2023-05-07 DIAGNOSIS — Z48812 Encounter for surgical aftercare following surgery on the circulatory system: Secondary | ICD-10-CM | POA: Diagnosis not present

## 2023-05-07 NOTE — Progress Notes (Signed)
Daily Session Note  Patient Details  Name: Keith Torres. MRN: [id number] Date of Birth: February 06, 1966 Referring Provider:   Flowsheet Row Cardiac Rehab from 02/26/2023 in [facility] Cardiac and Pulmonary Rehab  Referring Provider Dr. Clotilde Dieter, DO       Encounter Date: 05/07/2023  Check In:  Session Check In - 05/07/23 0859       Check-In   Supervising physician immediately available to respond to emergencies See telemetry face sheet for immediately available ER MD    Location [facility]-Cardiac & Pulmonary Rehab    Staff Present Lanny Hurst, RN, Franki Monte, BS, ACSM CEP, Exercise Physiologist;Laureen Manson Passey, BS, RRT, CPFT;Jason Wallace Cullens, RDN, LDN    Virtual Visit No    Medication changes reported     No    Fall or balance concerns reported    No    Warm-up and Cool-down Performed on first and last piece of equipment    Resistance Training Performed Yes    VAD Patient? No    PAD/SET Patient? No      Pain Assessment   Currently in Pain? No/denies                Social History   Tobacco Use  Smoking Status Never  Smokeless Tobacco Former   Types: Chew   Quit date: 1999    Goals Met:  Independence with exercise equipment Exercise tolerated well No report of concerns or symptoms today Strength training completed today  Goals Unmet:  Not Applicable  Comments: Pt able to follow exercise prescription today without complaint.  Will continue to monitor for progression.  On arrival pt noted irregular HR on pulse ox, states he just had cardioversion Friday. Placed pt on telemetry, noted afib/a flutter on monitor. HR WNL. Vitals stable. Pt asmptomatic. States he "feels better after cardioversion." Rhythm strips faxed to Dr. Juliann Pares at [facility] cardiology. Pt states he has a follow up appointment with them tomorrow.    Dr. Bethann Punches is Medical Director for [facility] Cardiac Rehabilitation.  Dr. Vida Rigger is Medical Director for [facility] Pulmonary  Rehabilitation.

## 2023-05-09 ENCOUNTER — Encounter: Payer: BC Managed Care – PPO | Admitting: *Deleted

## 2023-05-09 DIAGNOSIS — Z955 Presence of coronary angioplasty implant and graft: Secondary | ICD-10-CM

## 2023-05-09 DIAGNOSIS — I213 ST elevation (STEMI) myocardial infarction of unspecified site: Secondary | ICD-10-CM

## 2023-05-09 NOTE — Progress Notes (Signed)
Daily Session Note  Patient Details  Name: Keith Torres. MRN: [id number] Date of Birth: [birth year] Referring Provider:   Flowsheet Row Cardiac Rehab from 02/26/2023 in [facility] Cardiac and Pulmonary Rehab  Referring Provider Dr. Clotilde Dieter, DO       Encounter Date: 05/09/2023  Check In:  Session Check In - 05/09/23 0751       Check-In   Supervising physician immediately available to respond to emergencies See telemetry face sheet for immediately available ER MD    Location [facility]-Cardiac & Pulmonary Rehab    Staff Present Elige Ko, RCP,RRT,BSRT;Margaret Best, MS, Exercise Physiologist;Maxon Conetta BS, , Exercise Physiologist;Catina Nuss Katrinka Blazing, RN, ADN    Virtual Visit No    Medication changes reported     No    Fall or balance concerns reported    No    Warm-up and Cool-down Performed on first and last piece of equipment    Resistance Training Performed Yes    VAD Patient? No    PAD/SET Patient? No      Pain Assessment   Currently in Pain? No/denies                Social History   Tobacco Use  Smoking Status Never  Smokeless Tobacco Former   Types: Chew   Quit date: 1999    Goals Met:  Independence with exercise equipment Exercise tolerated well No report of concerns or symptoms today Strength training completed today  Goals Unmet:  Not Applicable  Comments: Pt able to follow exercise prescription today without complaint.  Will continue to monitor for progression.    Dr. Bethann Punches is Medical Director for [facility] Cardiac Rehabilitation.  Dr. Vida Rigger is Medical Director for [facility] Pulmonary Rehabilitation.

## 2023-05-11 ENCOUNTER — Encounter: Payer: BC Managed Care – PPO | Admitting: *Deleted

## 2023-05-11 DIAGNOSIS — I213 ST elevation (STEMI) myocardial infarction of unspecified site: Secondary | ICD-10-CM

## 2023-05-11 DIAGNOSIS — Z955 Presence of coronary angioplasty implant and graft: Secondary | ICD-10-CM | POA: Diagnosis not present

## 2023-05-11 NOTE — Progress Notes (Signed)
Daily Session Note  Patient Details  Name: Keith Torres. MRN: [id number] Date of Birth: 23-Oct-1965 Referring Provider:   Flowsheet Row Cardiac Rehab from 02/26/2023 in [facility] Cardiac and Pulmonary Rehab  Referring Provider Dr. Clotilde Dieter, DO       Encounter Date: 05/11/2023  Check In:  Session Check In - 05/11/23 0927       Check-In   Supervising physician immediately available to respond to emergencies See telemetry face sheet for immediately available ER MD    Location [facility]-Cardiac & Pulmonary Rehab    Staff Present Cora Collum, RN, BSN, CCRP;Joseph Hood, RCP,RRT,BSRT;Noah Tickle, [place], Exercise Physiologist    Virtual Visit No    Medication changes reported     No    Fall or balance concerns reported    No    Warm-up and Cool-down Performed on first and last piece of equipment    Resistance Training Performed Yes    VAD Patient? No    PAD/SET Patient? No      Pain Assessment   Currently in Pain? No/denies               Exercise Prescription Changes - 05/11/23 0700       Home Exercise Plan   Plans to continue exercise at Home (comment)   owns treadmill, may purchase aerobic machine   Frequency Add 2 additional days to program exercise sessions.    Initial Home Exercises Provided 05/11/23      Oxygen   Maintain Oxygen Saturation 88% or higher             Social History   Tobacco Use  Smoking Status Never  Smokeless Tobacco Former   Types: Chew   Quit date: 1999    Goals Met:  Independence with exercise equipment Exercise tolerated well No report of concerns or symptoms today  Goals Unmet:  Not Applicable  Comments: Pt able to follow exercise prescription today without complaint.  Will continue to monitor for progression.    Dr. Bethann Punches is Medical Director for [facility] Cardiac Rehabilitation.  Dr. Vida Rigger is Medical Director for [facility] Pulmonary Rehabilitation.

## 2023-05-14 ENCOUNTER — Encounter: Payer: BC Managed Care – PPO | Admitting: *Deleted

## 2023-05-14 DIAGNOSIS — Z955 Presence of coronary angioplasty implant and graft: Secondary | ICD-10-CM | POA: Diagnosis not present

## 2023-05-14 DIAGNOSIS — I213 ST elevation (STEMI) myocardial infarction of unspecified site: Secondary | ICD-10-CM

## 2023-05-14 NOTE — Progress Notes (Signed)
Daily Session Note  Patient Details  Name: Keith Torres. MRN: [id number] Date of Birth: 1965-10-08 Referring Provider:   Flowsheet Row Cardiac Rehab from 02/26/2023 in [facility] Cardiac and Pulmonary Rehab  Referring Provider Dr. Clotilde Dieter, DO       Encounter Date: 05/14/2023  Check In:  Session Check In - 05/14/23 0750       Check-In   Supervising physician immediately available to respond to emergencies See telemetry face sheet for immediately available ER MD    Location [facility]-Cardiac & Pulmonary Rehab    Staff Present Elige Ko, RCP,RRT,BSRT;Cora Collum, RN, BSN, CCRP;Jason Wallace Cullens, RDN, Clois Comber, RN, ADN    Virtual Visit No    Medication changes reported     No    Fall or balance concerns reported    No    Warm-up and Cool-down Performed on first and last piece of equipment    Resistance Training Performed Yes    VAD Patient? No    PAD/SET Patient? No      Pain Assessment   Currently in Pain? No/denies                Social History   Tobacco Use  Smoking Status Never  Smokeless Tobacco Former   Types: Chew   Quit date: 1999    Goals Met:  Independence with exercise equipment Exercise tolerated well No report of concerns or symptoms today Strength training completed today  Goals Unmet:  Not Applicable  Comments: Pt able to follow exercise prescription today without complaint.  Will continue to monitor for progression.    Dr. Bethann Punches is Medical Director for [facility] Cardiac Rehabilitation.  Dr. Vida Rigger is Medical Director for [facility] Pulmonary Rehabilitation.

## 2023-05-16 ENCOUNTER — Encounter: Payer: BC Managed Care – PPO | Admitting: *Deleted

## 2023-05-16 DIAGNOSIS — Z955 Presence of coronary angioplasty implant and graft: Secondary | ICD-10-CM | POA: Diagnosis not present

## 2023-05-16 DIAGNOSIS — I213 ST elevation (STEMI) myocardial infarction of unspecified site: Secondary | ICD-10-CM

## 2023-05-16 NOTE — Progress Notes (Signed)
Daily Session Note  Patient Details  Name: Keith Torres. MRN: [id number] Date of Birth: [birth year] Referring Provider:   Flowsheet Row Cardiac Rehab from 02/26/2023 in [facility] Cardiac and Pulmonary Rehab  Referring Provider Dr. Clotilde Dieter, DO       Encounter Date: 05/16/2023  Check In:  Session Check In - 05/16/23 0741       Check-In   Supervising physician immediately available to respond to emergencies See telemetry face sheet for immediately available ER MD    Location [facility]-Cardiac & Pulmonary Rehab    Staff Present Ronette Deter, BS, Exercise Physiologist;Susanne Bice, RN, BSN, CCRP;Maxon Conetta BS, , Exercise Physiologist;Nakeysha Pasqual Katrinka Blazing, RN, ADN    Virtual Visit No    Medication changes reported     No    Fall or balance concerns reported    No    Warm-up and Cool-down Performed on first and last piece of equipment    Resistance Training Performed Yes    VAD Patient? No    PAD/SET Patient? No      Pain Assessment   Currently in Pain? No/denies                Social History   Tobacco Use  Smoking Status Never  Smokeless Tobacco Former   Types: Chew   Quit date: 1999    Goals Met:  Independence with exercise equipment Exercise tolerated well No report of concerns or symptoms today Strength training completed today  Goals Unmet:  Not Applicable  Comments: Pt able to follow exercise prescription today without complaint.  Will continue to monitor for progression.    Dr. Bethann Punches is Medical Director for [facility] Cardiac Rehabilitation.  Dr. Vida Rigger is Medical Director for [facility] Pulmonary Rehabilitation.

## 2023-05-18 ENCOUNTER — Encounter: Payer: BC Managed Care – PPO | Admitting: *Deleted

## 2023-05-18 DIAGNOSIS — I213 ST elevation (STEMI) myocardial infarction of unspecified site: Secondary | ICD-10-CM

## 2023-05-18 DIAGNOSIS — Z955 Presence of coronary angioplasty implant and graft: Secondary | ICD-10-CM | POA: Diagnosis not present

## 2023-05-18 NOTE — Progress Notes (Signed)
Daily Session Note  Patient Details  Name: Keith Torres. MRN: [id number] Date of Birth: 1965-07-25 Referring Provider:   Flowsheet Row Cardiac Rehab from 02/26/2023 in [facility] Cardiac and Pulmonary Rehab  Referring Provider Dr. Clotilde Dieter, DO       Encounter Date: 05/18/2023  Check In:  Session Check In - 05/18/23 7829       Check-In   Supervising physician immediately available to respond to emergencies See telemetry face sheet for immediately available ER MD    Location [facility]-Cardiac & Pulmonary Rehab    Staff Present Ronette Deter, BS, Exercise Physiologist;Joseph Shelbie Proctor, RN, [place]    Virtual Visit No    Medication changes reported     No    Fall or balance concerns reported    No    Warm-up and Cool-down Performed on first and last piece of equipment    Resistance Training Performed Yes    VAD Patient? No    PAD/SET Patient? No      Pain Assessment   Currently in Pain? No/denies                Social History   Tobacco Use  Smoking Status Never  Smokeless Tobacco Former   Types: Chew   Quit date: 1999    Goals Met:  Independence with exercise equipment Exercise tolerated well No report of concerns or symptoms today Strength training completed today  Goals Unmet:  Not Applicable  Comments: Pt able to follow exercise prescription today without complaint.  Will continue to monitor for progression.    Dr. Bethann Punches is Medical Director for [facility] Cardiac Rehabilitation.  Dr. Vida Rigger is Medical Director for [facility] Pulmonary Rehabilitation.

## 2023-05-21 ENCOUNTER — Encounter: Payer: BC Managed Care – PPO | Admitting: *Deleted

## 2023-05-21 DIAGNOSIS — I213 ST elevation (STEMI) myocardial infarction of unspecified site: Secondary | ICD-10-CM

## 2023-05-21 DIAGNOSIS — Z955 Presence of coronary angioplasty implant and graft: Secondary | ICD-10-CM | POA: Diagnosis not present

## 2023-05-21 NOTE — Progress Notes (Signed)
Daily Session Note  Patient Details  Name: Keith Torres. MRN: [id number] Date of Birth: [birth year] Referring Provider:   Flowsheet Row Cardiac Rehab from 02/26/2023 in [facility] Cardiac and Pulmonary Rehab  Referring Provider Dr. Clotilde Dieter, DO       Encounter Date: 05/21/2023  Check In:  Session Check In - 05/21/23 0746       Check-In   Supervising physician immediately available to respond to emergencies See telemetry face sheet for immediately available ER MD    Location [facility]-Cardiac & Pulmonary Rehab    Staff Present Balinda Quails, RDN, LDN;Joseph Reino Kent, RCP,RRT,BSRT;Kelly Madilyn Fireman, BS, ACSM CEP, Exercise Physiologist;Edwing Figley Katrinka Blazing, RN, ADN    Virtual Visit No    Medication changes reported     No    Fall or balance concerns reported    No    Warm-up and Cool-down Performed on first and last piece of equipment    Resistance Training Performed Yes    VAD Patient? No    PAD/SET Patient? No      Pain Assessment   Currently in Pain? No/denies                Social History   Tobacco Use  Smoking Status Never  Smokeless Tobacco Former   Types: Chew   Quit date: 1999    Goals Met:  Independence with exercise equipment Exercise tolerated well No report of concerns or symptoms today Strength training completed today  Goals Unmet:  Not Applicable  Comments: Pt able to follow exercise prescription today without complaint.  Will continue to monitor for progression.    Dr. Bethann Punches is Medical Director for [facility] Cardiac Rehabilitation.  Dr. Vida Rigger is Medical Director for [facility] Pulmonary Rehabilitation.

## 2023-05-23 ENCOUNTER — Encounter: Payer: Self-pay | Admitting: *Deleted

## 2023-05-23 ENCOUNTER — Encounter: Payer: BC Managed Care – PPO | Admitting: *Deleted

## 2023-05-23 VITALS — Ht 74.0 in | Wt 234.7 lb

## 2023-05-23 DIAGNOSIS — Z955 Presence of coronary angioplasty implant and graft: Secondary | ICD-10-CM

## 2023-05-23 DIAGNOSIS — I213 ST elevation (STEMI) myocardial infarction of unspecified site: Secondary | ICD-10-CM

## 2023-05-23 NOTE — Progress Notes (Signed)
Cardiac Individual Treatment Plan  Patient Details  Name: Keith Torres. MRN: [id number] Date of Birth: 08-Jan-1966 Referring Provider:   Flowsheet Row Cardiac Rehab from 02/26/2023 in [facility] Cardiac and Pulmonary Rehab  Referring Provider Dr. Clotilde Dieter, DO       Initial Encounter Date:  Flowsheet Row Cardiac Rehab from 02/26/2023 in [facility] Cardiac and Pulmonary Rehab  Date 02/26/23       Visit Diagnosis: ST elevation myocardial infarction (STEMI), unspecified artery [facility])  Status post coronary artery stent placement  Patient's Home Medications on Admission:  Current Outpatient Medications:    amiodarone (PACERONE) 200 MG tablet, Take 200 mg by mouth in the morning., Disp: , Rfl:    apixaban (ELIQUIS) 5 MG TABS tablet, Take 5 mg by mouth 2 (two) times daily., Disp: , Rfl:    atorvastatin (LIPITOR) 20 MG tablet, Take 10 mg by mouth every other day., Disp: , Rfl:    clopidogrel (PLAVIX) 75 MG tablet, Take 75 mg by mouth in the morning., Disp: , Rfl:    dapagliflozin propanediol (FARXIGA) 10 MG TABS tablet, Take 10 mg by mouth in the morning., Disp: , Rfl:    furosemide (LASIX) 40 MG tablet, Take 40 mg by mouth daily., Disp: , Rfl:    metoprolol tartrate (LOPRESSOR) 100 MG tablet, Take 100 mg by mouth 2 (two) times daily., Disp: , Rfl:    sacubitril-valsartan (ENTRESTO) 24-26 MG, Take 1 tablet by mouth 2 (two) times daily., Disp: , Rfl:   Past Medical History: Past Medical History:  Diagnosis Date   Anxiety    Atrial fibrillation (HCC)    Dental crowns present    FRONT TWO TEETH   Deviated septum    Dysrhythmia    A-FIB/ DR Gwen Pounds   Hyperlipidemia    Hypertrophy of nasal turbinates    Meningitis    Obesity    Rhinitis, allergic    Seasonal allergies    Sleep apnea    C-PAP    Tobacco Use: Social History   Tobacco Use  Smoking Status Never  Smokeless Tobacco Former   Types: Chew   Quit date: 1999    Labs: Review Flowsheet       Latest Ref Rng &  Units 04/10/2017 01/12/2023 01/13/2023  Labs for ITP Cardiac and Pulmonary Rehab  Cholestrol 0 - 200 mg/dL 782  956  213   LDL (calc) 0 - 99 mg/dL 086  578  77   HDL-C >46 mg/dL 52  51  54   Trlycerides <150 mg/dL 962  952  90   Hemoglobin A1c 4.8 - 5.6 % - - 5.4      Exercise Target Goals: Exercise Program Goal: Individual exercise prescription set using results from initial 6 min walk test and THRR while considering  patient's activity barriers and safety.   Exercise Prescription Goal: Initial exercise prescription builds to 30-45 minutes a day of aerobic activity, 2-3 days per week.  Home exercise guidelines will be given to patient during program as part of exercise prescription that the participant will acknowledge.   Education: Aerobic Exercise: - Group verbal and visual presentation on the components of exercise prescription. Introduces F.I.T.T principle from ACSM for exercise prescriptions.  Reviews F.I.T.T. principles of aerobic exercise including progression. Written material given at graduation. Flowsheet Row Cardiac Rehab from 02/26/2023 in [facility] Cardiac and Pulmonary Rehab  Education need identified 02/26/23       Education: Resistance Exercise: - Group verbal and visual presentation on the components  of exercise prescription. Introduces F.I.T.T principle from ACSM for exercise prescriptions  Reviews F.I.T.T. principles of resistance exercise including progression. Written material given at graduation.    Education: Exercise & Equipment Safety: - Individual verbal instruction and demonstration of equipment use and safety with use of the equipment. Flowsheet Row Cardiac Rehab from 02/26/2023 in [facility] Cardiac and Pulmonary Rehab  Date 02/07/23  Educator [facility]  Instruction Review Code 1- Verbalizes Understanding       Education: Exercise Physiology & General Exercise Guidelines: - Group verbal and written instruction with models to review the exercise physiology of the  cardiovascular system and associated critical values. Provides general exercise guidelines with specific guidelines to those with heart or lung disease.    Education: Flexibility, Balance, Mind/Body Relaxation: - Group verbal and visual presentation with interactive activity on the components of exercise prescription. Introduces F.I.T.T principle from ACSM for exercise prescriptions. Reviews F.I.T.T. principles of flexibility and balance exercise training including progression. Also discusses the mind body connection.  Reviews various relaxation techniques to help reduce and manage stress (i.e. Deep breathing, progressive muscle relaxation, and visualization). Balance handout provided to take home. Written material given at graduation.   Activity Barriers & Risk Stratification:  Activity Barriers & Cardiac Risk Stratification - 02/26/23 1139       Activity Barriers & Cardiac Risk Stratification   Activity Barriers Other (comment)    Comments Osteoarthritis knee, Gout    Cardiac Risk Stratification Moderate             6 Minute Walk:  6 Minute Walk     Row Name 02/26/23 1136 05/23/23 0745       6 Minute Walk   Phase Initial Discharge    Distance 1010 feet 1600 feet    Distance % Change -- 58.4 %    Distance Feet Change -- 590 ft    Walk Time 6 minutes 6 minutes    # of Rest Breaks 0 0    MPH 1.91 3    METS 3.07 3.9    RPE 13 11    Perceived Dyspnea  2 1    VO2 Peak 10.75 13.6    Symptoms No No    Resting HR 108 bpm 78 bpm    Resting BP 94/60 112/80    Resting Oxygen Saturation  98 % 96 %    Exercise Oxygen Saturation  during 6 min walk 96 % 95 %    Max Ex. HR 133 bpm 93 bpm    Max Ex. BP 108/66 124/80    2 Minute Post BP 98/64 --             Oxygen Initial Assessment:   Oxygen Re-Evaluation:   Oxygen Discharge (Final Oxygen Re-Evaluation):   Initial Exercise Prescription:  Initial Exercise Prescription - 02/26/23 1100       Date of Initial Exercise  RX and Referring Provider   Date 02/26/23    Referring Provider Dr. Clotilde Dieter, DO      Oxygen   Maintain Oxygen Saturation 88% or higher      Treadmill   MPH 1.8    Grade 0    Minutes 15    METs 2.38      Recumbant Bike   Level 2    RPM 50    Watts 36    Minutes 15    METs 3.07      NuStep   Level 2    SPM 80    Minutes  15    METs 3.07      REL-XR   Level 2    Speed 50    Minutes 15    METs 3.07      Track   Laps 30    Minutes 15    METs 3.07      Prescription Details   Frequency (times per week) 3    Duration Progress to 30 minutes of continuous aerobic without signs/symptoms of physical distress      Intensity   THRR 40-80% of Max Heartrate 129-151    Ratings of Perceived Exertion 11-13    Perceived Dyspnea 0-4      Resistance Training   Training Prescription Yes    Weight 5 lb    Reps 10-15             Perform Capillary Blood Glucose checks as needed.  Exercise Prescription Changes:   Exercise Prescription Changes     Row Name 02/26/23 1100 03/15/23 0800 03/29/23 1400 04/12/23 1500 04/26/23 1400     Response to Exercise   Blood Pressure (Admit) 94/60 110/72 116/62 110/68 100/62   Blood Pressure (Exercise) 108/66 128/58 124/68 122/72 --   Blood Pressure (Exit) 98/64 118/62 98/58 98/58  98/66   Heart Rate (Admit) 108 bpm 97 bpm 109 bpm 89 bpm 100 bpm   Heart Rate (Exercise) 133 bpm 131 bpm 134 bpm 121 bpm 121 bpm   Heart Rate (Exit) 122 bpm 105 bpm 108 bpm 84 bpm 90 bpm   Oxygen Saturation (Admit) 98 % -- -- -- --   Oxygen Saturation (Exercise) 96 % -- -- -- --   Rating of Perceived Exertion (Exercise) 13 12 13 13 13    Perceived Dyspnea (Exercise) 2 -- -- 0 --   Symptoms none none none none none   Comments Results First two weeks of exercise -- -- --   Duration -- Progress to 30 minutes of  aerobic without signs/symptoms of physical distress Progress to 30 minutes of  aerobic without signs/symptoms of physical distress Progress  to 30 minutes of  aerobic without signs/symptoms of physical distress Progress to 30 minutes of  aerobic without signs/symptoms of physical distress   Intensity -- THRR unchanged THRR unchanged THRR unchanged THRR unchanged     Progression   Progression -- Continue to progress workloads to maintain intensity without signs/symptoms of physical distress. Continue to progress workloads to maintain intensity without signs/symptoms of physical distress. Continue to progress workloads to maintain intensity without signs/symptoms of physical distress. Continue to progress workloads to maintain intensity without signs/symptoms of physical distress.   Average METs -- 3.3 3.34 3.9 3.9     Resistance Training   Training Prescription -- Yes Yes Yes Yes   Weight -- 5 lb 5 lb 5 lb 5 lb   Reps -- 10-15 10-15 10-15 10-15     Interval Training   Interval Training -- No No No No     Treadmill   MPH -- 3 3 3 3    Grade -- 0 1 0 1   Minutes -- 15 15 15 15    METs -- 3.3 3.71 3.3 3.71     Recumbant Bike   Level -- -- 2 -- --   Watts -- -- 36 -- --   Minutes -- -- 15 -- --   METs -- -- 3.07 -- --     NuStep   Level -- 5 4 4 5    Minutes -- 15 15 15  15  METs -- 4.5 3.7 4.2 4.4     REL-XR   Level -- 2 3  went up to level 4 9 9    Minutes -- 15 15 15 15    METs -- 4.2 2.6 5.3 4.6     T5 Nustep   Level -- -- 2 -- --   Minutes -- -- 15 -- --   METs -- -- 2.7 -- --     Oxygen   Maintain Oxygen Saturation -- 88% or higher 88% or higher 88% or higher 88% or higher    Row Name 05/09/23 1100 05/11/23 0700           Response to Exercise   Blood Pressure (Admit) 108/82 --      Blood Pressure (Exit) 118/68 --      Heart Rate (Admit) 74 bpm --      Heart Rate (Exercise) 120 bpm --      Heart Rate (Exit) 77 bpm --      Oxygen Saturation (Admit) 99 % --      Oxygen Saturation (Exercise) 95 % --      Oxygen Saturation (Exit) 98 % --      Rating of Perceived Exertion (Exercise) 13 --      Perceived  Dyspnea (Exercise) 0 --      Symptoms none --      Duration Progress to 30 minutes of  aerobic without signs/symptoms of physical distress --      Intensity THRR unchanged --        Progression   Progression Continue to progress workloads to maintain intensity without signs/symptoms of physical distress. --      Average METs 4.4 --        Resistance Training   Training Prescription Yes --      Weight 5 lb --      Reps 10-15 --        Interval Training   Interval Training No --        Treadmill   MPH 2.9 --      Grade 2 --      Minutes 15 --      METs 4.02 --        Recumbant Bike   Level 7 --      Watts 46 --      Minutes 15 --      METs 3.33 --        NuStep   Level 8 --      Minutes 15 --      METs 5.5 --        REL-XR   Level 6 --      Minutes 15 --      METs 5.5 --        Home Exercise Plan   Plans to continue exercise at -- Home (comment)  owns treadmill, may purchase aerobic machine      Frequency -- Add 2 additional days to program exercise sessions.      Initial Home Exercises Provided -- 05/11/23        Oxygen   Maintain Oxygen Saturation 88% or higher 88% or higher               Exercise Comments:   Exercise Comments     Row Name 02/28/23 0756           Exercise Comments First full day of exercise!  Patient was oriented to gym and equipment including functions, settings, policies,  and procedures.  Patient's individual exercise prescription and treatment plan were reviewed.  All starting workloads were established based on the results of the 6 minute walk test done at initial orientation visit.  The plan for exercise progression was also introduced and progression will be customized based on patient's performance and goals.                Exercise Goals and Review:   Exercise Goals     Row Name 02/26/23 1139             Exercise Goals   Increase Physical Activity Yes       Intervention Provide advice, education, support and  counseling about physical activity/exercise needs.;Develop an individualized exercise prescription for aerobic and resistive training based on initial evaluation findings, risk stratification, comorbidities and participant's personal goals.       Expected Outcomes Short Term: Attend rehab on a regular basis to increase amount of physical activity.;Long Term: Add in home exercise to make exercise part of routine and to increase amount of physical activity.;Long Term: Exercising regularly at least 3-5 days a week.       Increase Strength and Stamina Yes       Intervention Provide advice, education, support and counseling about physical activity/exercise needs.;Develop an individualized exercise prescription for aerobic and resistive training based on initial evaluation findings, risk stratification, comorbidities and participant's personal goals.       Expected Outcomes Short Term: Increase workloads from initial exercise prescription for resistance, speed, and METs.;Short Term: Perform resistance training exercises routinely during rehab and add in resistance training at home;Long Term: Improve cardiorespiratory fitness, muscular endurance and strength as measured by increased METs and functional capacity ( )       Able to understand and use rate of perceived exertion (RPE) scale Yes       Intervention Provide education and explanation on how to use RPE scale       Expected Outcomes Short Term: Able to use RPE daily in rehab to express subjective intensity level;Long Term:  Able to use RPE to guide intensity level when exercising independently       Able to understand and use Dyspnea scale Yes       Intervention Provide education and explanation on how to use Dyspnea scale       Expected Outcomes Short Term: Able to use Dyspnea scale daily in rehab to express subjective sense of shortness of breath during exertion;Long Term: Able to use Dyspnea scale to guide intensity level when exercising independently        Knowledge and understanding of Target Heart Rate Range (THRR) Yes       Intervention Provide education and explanation of THRR including how the numbers were predicted and where they are located for reference       Expected Outcomes Short Term: Able to state/look up THRR;Long Term: Able to use THRR to govern intensity when exercising independently;Short Term: Able to use daily as guideline for intensity in rehab       Able to check pulse independently Yes       Intervention Review the importance of being able to check your own pulse for safety during independent exercise;Provide education and demonstration on how to check pulse in carotid and radial arteries.       Expected Outcomes Short Term: Able to explain why pulse checking is important during independent exercise;Long Term: Able to check pulse independently and accurately       Understanding  of Exercise Prescription Yes       Intervention Provide education, explanation, and written materials on patient's individual exercise prescription       Expected Outcomes Short Term: Able to explain program exercise prescription;Long Term: Able to explain home exercise prescription to exercise independently                Exercise Goals Re-Evaluation :  Exercise Goals Re-Evaluation     Row Name 02/28/23 0756 03/15/23 0847 03/29/23 1417 04/12/23 1541 04/26/23 1421     Exercise Goal Re-Evaluation   Exercise Goals Review Able to understand and use rate of perceived exertion (RPE) scale;Able to understand and use Dyspnea scale;Knowledge and understanding of Target Heart Rate Range (THRR);Understanding of Exercise Prescription Increase Physical Activity;Increase Strength and Stamina;Understanding of Exercise Prescription Increase Physical Activity;Increase Strength and Stamina;Understanding of Exercise Prescription Increase Physical Activity;Increase Strength and Stamina;Understanding of Exercise Prescription Increase Physical Activity;Increase  Strength and Stamina;Understanding of Exercise Prescription   Comments Reviewed RPE and dyspnea scale, THR and program prescription with pt today.  Pt voiced understanding and was given a copy of goals to take home. Sharlot Gowda is off to a good start in the program. He has completed his first 3 session dring this review. During these sessions he was able to increase his level on the T4 nustep from 2 to 5. He was also able to increase his speed on the treadmill from 1.8 mph to 3 mph. He also seems to be doing well with the 5 lb handweights. We will continue to monitor his progress in the program. Sharlot Gowda is doing well in rehab. He has recently been able to increase his level on the XR from level 2 to 3. He also added an incline of 1% to his treadmill workload. We will continue to monitor his progress in the program. Sharlot Gowda continues to make improvements in the program. He was able to increase his level on the XR from level 3 to level 9. He was also able to maintain his intensity on the treadmill and T4 nustep. We will continue to monitor his progress in the program. Tammy Sours continues to do well in the program. He added incline back to his treadmill workload at 1% and a speed of 3 mph. He also improved to level 5 on the T4 nustep and continues to work at level 9 on the XR. He has stayed consistent with 5 lb hand weights for resistance training as well. We will continue to monitor his progress in the program.   Expected Outcomes Short: Use RPE daily to regulate intensity. Long: Follow program prescription in THR. Short: Continue to follow current exercise prescription. Long: Continue exercise to improve strength and stamina. Short: Continue to follow current exercise prescription. Long: Continue exercise to improve strength and stamina. Short: Continue to follow current exercise prescription. Long: Continue exercise to improve strength and stamina. Short: Increase to 6 lb hand weights for resistance training. Long: Continue  exercise to improve strength and stamina.    Row Name 05/09/23 1111 05/11/23 0754           Exercise Goal Re-Evaluation   Exercise Goals Review Increase Physical Activity;Increase Strength and Stamina;Understanding of Exercise Prescription Increase Physical Activity;Increase Strength and Stamina;Understanding of Exercise Prescription;Able to understand and use Dyspnea scale;Knowledge and understanding of Target Heart Rate Range (THRR);Able to check pulse independently;Able to understand and use rate of perceived exertion (RPE) scale      Comments Tammy Sours continues to do well in rehab. He was able  to increase his level on the XR from 5 to 8, and increase his level on the recumbent bike from level 2 to 7. He was also able to increase his incline on the treadmill to 2%. We will continue to monitor his progress in the program. Reviewed home exercise with pt today.  Pt plans to use treadmill at home, and is looking into purchasing an aerobic machine as well for exercise.  Reviewed THR, pulse, RPE, sign and symptoms, pulse oximetery and when to call 911 or MD.  Also discussed weather considerations and indoor options.  Pt voiced understanding.      Expected Outcomes Short: Continue to progressively increase treadmill workload. Long: Continue exercise to improve strength and stamina. Short: Begin using treadmill on days away from rehab. Long: Continue to exercise independently.               Discharge Exercise Prescription (Final Exercise Prescription Changes):  Exercise Prescription Changes - 05/11/23 0700       Home Exercise Plan   Plans to continue exercise at Home (comment)   owns treadmill, may purchase aerobic machine   Frequency Add 2 additional days to program exercise sessions.    Initial Home Exercises Provided 05/11/23      Oxygen   Maintain Oxygen Saturation 88% or higher             Nutrition:  Target Goals: Understanding of nutrition guidelines, daily intake of sodium  1500mg , cholesterol 200mg , calories 30% from fat and 7% or less from saturated fats, daily to have 5 or more servings of fruits and vegetables.  Education: All About Nutrition: -Group instruction provided by verbal, written material, interactive activities, discussions, models, and posters to present general guidelines for heart healthy nutrition including fat, fiber, MyPlate, the role of sodium in heart healthy nutrition, utilization of the nutrition label, and utilization of this knowledge for meal planning. Follow up email sent as well. Written material given at graduation. Flowsheet Row Cardiac Rehab from 02/26/2023 in [facility] Cardiac and Pulmonary Rehab  Education need identified 02/26/23       Biometrics:  Pre Biometrics - 02/26/23 1140       Pre Biometrics   Height 6\' 2"  (1.88 m)    Weight 233 lb 8 oz (105.9 kg)    Waist Circumference 41.5 inches    Hip Circumference 43.5 inches    Waist to Hip Ratio 0.95 %    BMI (Calculated) 29.97    Single Leg Stand 10.1 seconds             Post Biometrics - 05/23/23 0747        Post  Biometrics   Height 6\' 2"  (1.88 m)    Weight 234 lb 11.2 oz (106.5 kg)    Waist Circumference 41 inches    Hip Circumference 45 inches    Waist to Hip Ratio 0.91 %    BMI (Calculated) 30.12    Single Leg Stand 30 seconds             Nutrition Therapy Plan and Nutrition Goals:  Nutrition Therapy & Goals - 02/26/23 1147       Intervention Plan   Intervention Prescribe, educate and counsel regarding individualized specific dietary modifications aiming towards targeted core components such as weight, hypertension, lipid management, diabetes, heart failure and other comorbidities.    Expected Outcomes Short Term Goal: Understand basic principles of dietary content, such as calories, fat, sodium, cholesterol and nutrients.;Short Term Goal: A plan has  been developed with personal nutrition goals set during dietitian appointment.;Long Term Goal:  Adherence to prescribed nutrition plan.             Nutrition Assessments:  MEDIFICTS Score Key: >=70 Need to make dietary changes  40-70 Heart Healthy Diet <= 40 Therapeutic Level Cholesterol Diet  Flowsheet Row Cardiac Rehab from 02/26/2023 in [facility] Cardiac and Pulmonary Rehab  Picture Your Plate Total Score on Admission 47      Picture Your Plate Scores: <86 Unhealthy dietary pattern with much room for improvement. 41-50 Dietary pattern unlikely to meet recommendations for good health and room for improvement. 51-60 More healthful dietary pattern, with some room for improvement.  >60 Healthy dietary pattern, although there may be some specific behaviors that could be improved.    Nutrition Goals Re-Evaluation:  Nutrition Goals Re-Evaluation     Row Name 03/19/23 0810 04/20/23 0750 05/17/23 1006         Goals   Current Weight -- 237 lb (107.5 kg) --     Nutrition Goal -- Eat more healthy Eat more healthy   decrease portion sizes     Comment He reports he was eating only veggies when he got out of the hospital becasue he knew meat was bad for him. Reports he is loves meat, and has gone back to eating it regularly. Spoke with him about not commiting to all veggies or all carbs or all meat diets, but rather make more balanced plates with smaller portions of lean meat with controlled portions of carbs and larger portions of non starchy veggies. Sharlot Gowda states that he over eats at times and wants to work on that. He eats his first plate because he was hungry then the second plate because it was good. He is going to rty to cut back on portion sizes. Sharlot Gowda states that he  is still eating 2 portions at meal time. first portion he was hungry then the second plate because it was good.  He is working on decreasing the portion and has not been 100% successful yet.  He will try ot eat slower and see if he feels full enough to not eat the second portion.     Expected Outcome Short: work on  reducing portions of meat, choosing leaner cuts, and including more colorful produce on plates. Long: maintain a heart healthy diet with balanced plates Short: work on portion sizes. Long: maintain a caloric intake that works for him. Short: work on portion sizes, eating  first portion slower . Long: able to eat one portion only              Nutrition Goals Discharge (Final Nutrition Goals Re-Evaluation):  Nutrition Goals Re-Evaluation - 05/17/23 1006       Goals   Nutrition Goal Eat more healthy   decrease portion sizes    Comment Sharlot Gowda states that he  is still eating 2 portions at meal time. first portion he was hungry then the second plate because it was good.  He is working on decreasing the portion and has not been 100% successful yet.  He will try ot eat slower and see if he feels full enough to not eat the second portion.    Expected Outcome Short: work on portion sizes, eating  first portion slower . Long: able to eat one portion only             Psychosocial: Target Goals: Acknowledge presence or absence of significant depression and/or stress, maximize coping  skills, provide positive support system. Participant is able to verbalize types and ability to use techniques and skills needed for reducing stress and depression.   Education: Stress, Anxiety, and Depression - Group verbal and visual presentation to define topics covered.  Reviews how body is impacted by stress, anxiety, and depression.  Also discusses healthy ways to reduce stress and to treat/manage anxiety and depression.  Written material given at graduation. Flowsheet Row Cardiac Rehab from 02/26/2023 in [facility] Cardiac and Pulmonary Rehab  Education need identified 02/26/23       Education: Sleep Hygiene -Provides group verbal and written instruction about how sleep can affect your health.  Define sleep hygiene, discuss sleep cycles and impact of sleep habits. Review good sleep hygiene tips.    Initial Review &  Psychosocial Screening:  Initial Psych Review & Screening - 02/07/23 0910       Initial Review   Current issues with None Identified      Family Dynamics   Good Support System? Yes    Comments He can look to his wife and  his son for support.  He has a good outlook on his health and is ready to start rehab .      Barriers   Psychosocial barriers to participate in program There are no identifiable barriers or psychosocial needs.;The patient should benefit from training in stress management and relaxation.      Screening Interventions   Interventions Encouraged to exercise;To provide support and resources with identified psychosocial needs;Provide feedback about the scores to participant    Expected Outcomes Short Term goal: Utilizing psychosocial counselor, staff and physician to assist with identification of specific Stressors or current issues interfering with healing process. Setting desired goal for each stressor or current issue identified.;Long Term Goal: Stressors or current issues are controlled or eliminated.;Long Term goal: The participant improves quality of Life and PHQ9 Scores as seen by post scores and/or verbalization of changes;Short Term goal: Identification and review with participant of any Quality of Life or Depression concerns found by scoring the questionnaire.             Quality of Life Scores:   Quality of Life - 02/26/23 1146       Quality of Life   Select Quality of Life      Quality of Life Scores   Health/Function Pre 18.9 %    Socioeconomic Pre 30 %    Psych/Spiritual Pre 28.07 %    Family Pre 27.6 %    GLOBAL Pre 24.18 %            Scores of 19 and below usually indicate a poorer quality of life in these areas.  A difference of  2-3 points is a clinically meaningful difference.  A difference of 2-3 points in the total score of the Quality of Life Index has been associated with significant improvement in overall quality of life, self-image,  physical symptoms, and general health in studies assessing change in quality of life.  PHQ-9: Review Flowsheet       03/21/2023 02/26/2023  Depression screen PHQ 2/9  Decreased Interest 0 0  Down, Depressed, Hopeless 0 0  PHQ - 2 Score 0 0  Altered sleeping 0 1  Tired, decreased energy 3 3  Change in appetite 0 0  Feeling bad or failure about yourself  0 0  Trouble concentrating 0 0  Moving slowly or fidgety/restless 0 1  Suicidal thoughts 0 0  PHQ-9 Score 3 5  Difficult doing work/chores Not difficult at all Very difficult   Interpretation of Total Score  Total Score Depression Severity:  1-4 = Minimal depression, 5-9 = Mild depression, 10-14 = Moderate depression, 15-19 = Moderately severe depression, 20-27 = Severe depression   Psychosocial Evaluation and Intervention:  Psychosocial Evaluation - 02/07/23 0913       Psychosocial Evaluation & Interventions   Interventions Relaxation education;Stress management education;Encouraged to exercise with the program and follow exercise prescription    Comments He can look to his wife and his son for support. He has a good outlook on his health and is ready to start rehab .    Expected Outcomes Short: Start [facility] to help with mood. Long: Maintain a healthy mental state    Continue Psychosocial Services  Follow up required by staff             Psychosocial Re-Evaluation:  Psychosocial Re-Evaluation     Row Name 03/21/23 0738 04/20/23 0748 05/17/23 1006         Psychosocial Re-Evaluation   Current issues with None Identified None Identified --     Comments Reviewed patient health questionnaire (PHQ-9) with patient for follow up. Previously, patients score indicated signs/symptoms of depression.  Reviewed to see if patient is improving symptom wise while in program.  Score improved and patient states that it is because he has been able to go back to work. Patient reports no issues with their current mental states, sleep,  stress, depression or anxiety. Will follow up with patient in a few weeks for any changes. Continues with no barriers or concerns with psychosocial issues     Expected Outcomes Short: Continue to attend [facility] regularly for regular exercise and social engagement. Long: Continue to improve symptoms and manage a positive mental state. Short: Continue to exercise regularly to support mental health and notify staff of any changes. Long: maintain mental health and well being through teaching of rehab or prescribed medications independently. Short: Continue to exercise regularly to support mental health and notify staff of any changes. Long: maintain mental health and well being through teaching of rehab or prescribed medications independently.     Interventions Encouraged to attend Cardiac Rehabilitation for the exercise Encouraged to attend Cardiac Rehabilitation for the exercise Encouraged to attend Cardiac Rehabilitation for the exercise     Continue Psychosocial Services  Follow up required by staff Follow up required by staff Follow up required by staff              Psychosocial Discharge (Final Psychosocial Re-Evaluation):  Psychosocial Re-Evaluation - 05/17/23 1006       Psychosocial Re-Evaluation   Comments Continues with no barriers or concerns with psychosocial issues    Expected Outcomes Short: Continue to exercise regularly to support mental health and notify staff of any changes. Long: maintain mental health and well being through teaching of rehab or prescribed medications independently.    Interventions Encouraged to attend Cardiac Rehabilitation for the exercise    Continue Psychosocial Services  Follow up required by staff             Vocational Rehabilitation: Provide vocational rehab assistance to qualifying candidates.   Vocational Rehab Evaluation & Intervention:   Education: Education Goals: Education classes will be provided on a variety of topics geared toward  better understanding of heart health and risk factor modification. Participant will state understanding/return demonstration of topics presented as noted by education test scores.  Learning Barriers/Preferences:  Learning Barriers/Preferences - 02/07/23  3716       Learning Barriers/Preferences   Learning Barriers None    Learning Preferences None             General Cardiac Education Topics:  AED/CPR: - Group verbal and written instruction with the use of models to demonstrate the basic use of the AED with the basic ABC's of resuscitation.   Anatomy and Cardiac Procedures: - Group verbal and visual presentation and models provide information about basic cardiac anatomy and function. Reviews the testing methods done to diagnose heart disease and the outcomes of the test results. Describes the treatment choices: Medical Management, Angioplasty, or Coronary Bypass Surgery for treating various heart conditions including Myocardial Infarction, Angina, Valve Disease, and Cardiac Arrhythmias.  Written material given at graduation. Flowsheet Row Cardiac Rehab from 02/26/2023 in [facility] Cardiac and Pulmonary Rehab  Education need identified 02/26/23       Medication Safety: - Group verbal and visual instruction to review commonly prescribed medications for heart and lung disease. Reviews the medication, class of the drug, and side effects. Includes the steps to properly store meds and maintain the prescription regimen.  Written material given at graduation.   Intimacy: - Group verbal instruction through game format to discuss how heart and lung disease can affect sexual intimacy. Written material given at graduation..   Know Your Numbers and Heart Failure: - Group verbal and visual instruction to discuss disease risk factors for cardiac and pulmonary disease and treatment options.  Reviews associated critical values for Overweight/Obesity, Hypertension, Cholesterol, and Diabetes.  Discusses  basics of heart failure: signs/symptoms and treatments.  Introduces Heart Failure Zone chart for action plan for heart failure.  Written material given at graduation.   Infection Prevention: - Provides verbal and written material to individual with discussion of infection control including proper hand washing and proper equipment cleaning during exercise session. Flowsheet Row Cardiac Rehab from 02/26/2023 in [facility] Cardiac and Pulmonary Rehab  Date 02/07/23  Educator [facility]  Instruction Review Code 1- Verbalizes Understanding       Falls Prevention: - Provides verbal and written material to individual with discussion of falls prevention and safety. Flowsheet Row Cardiac Rehab from 02/26/2023 in [facility] Cardiac and Pulmonary Rehab  Date 02/07/23  Educator [facility]  Instruction Review Code 1- Verbalizes Understanding       Other: -Provides group and verbal instruction on various topics (see comments)   Knowledge Questionnaire Score:  Knowledge Questionnaire Score - 02/26/23 1147       Knowledge Questionnaire Score   Pre Score 17/26             Core Components/Risk Factors/Patient Goals at Admission:  Personal Goals and Risk Factors at Admission - 02/07/23 0909       Core Components/Risk Factors/Patient Goals on Admission    Weight Management Yes;Weight Maintenance    Intervention Weight Management: Develop a combined nutrition and exercise program designed to reach desired caloric intake, while maintaining appropriate intake of nutrient and fiber, sodium and fats, and appropriate energy expenditure required for the weight goal.;Weight Management: Provide education and appropriate resources to help participant work on and attain dietary goals.;Weight Management/Obesity: Establish reasonable short term and long term weight goals.    Expected Outcomes Short Term: Continue to assess and modify interventions until short term weight is achieved;Long Term: Adherence to nutrition and physical  activity/exercise program aimed toward attainment of established weight goal;Weight Maintenance: Understanding of the daily nutrition guidelines, which includes 25-35% calories from fat, 7% or less  cal from saturated fats, less than 200mg  cholesterol, less than 1.5gm of sodium, & 5 or more servings of fruits and vegetables daily;Understanding recommendations for meals to include 15-35% energy as protein, 25-35% energy from fat, 35-60% energy from carbohydrates, less than 200mg  of dietary cholesterol, 20-35 gm of total fiber daily;Understanding of distribution of calorie intake throughout the day with the consumption of 4-5 meals/snacks    Hypertension Yes    Intervention Provide education on lifestyle modifcations including regular physical activity/exercise, weight management, moderate sodium restriction and increased consumption of fresh fruit, vegetables, and low fat dairy, alcohol moderation, and smoking cessation.;Monitor prescription use compliance.    Expected Outcomes Short Term: Continued assessment and intervention until BP is < 140/52mm HG in hypertensive participants. < 130/20mm HG in hypertensive participants with diabetes, heart failure or chronic kidney disease.;Long Term: Maintenance of blood pressure at goal levels.    Lipids Yes    Intervention Provide education and support for participant on nutrition & aerobic/resistive exercise along with prescribed medications to achieve LDL 70mg , HDL >40mg .    Expected Outcomes Short Term: Participant states understanding of desired cholesterol values and is compliant with medications prescribed. Participant is following exercise prescription and nutrition guidelines.;Long Term: Cholesterol controlled with medications as prescribed, with individualized exercise RX and with personalized nutrition plan. Value goals: LDL < 70mg , HDL > 40 mg.             Education:Diabetes - Individual verbal and written instruction to review signs/symptoms of  diabetes, desired ranges of glucose level fasting, after meals and with exercise. Acknowledge that pre and post exercise glucose checks will be done for 3 sessions at entry of program.   Core Components/Risk Factors/Patient Goals Review:   Goals and Risk Factor Review     Row Name 03/21/23 0735 04/20/23 0756 05/17/23 1020         Core Components/Risk Factors/Patient Goals Review   Personal Goals Review Weight Management/Obesity;Hypertension Other Weight Management/Obesity;Hypertension;Increase knowledge of respiratory medications and ability to use respiratory devices properly.     Review Sharlot Gowda is down 20 pounds since he had is heart event. He is trying to maintain his weight currently. He is checking his blood pressure at home most of the days. His pressure is doing well in the program. He has no questions on his medications. Sharlot Gowda is going to to his doctors appointment in the upcoming week for Cardioversion. He cannot tell that he is in afib and states that he may need a defribulator or pacemaker. He is doing well with his rehab and is working our routinely. Weight remains same, he is aware that he needs to not eat two portion at every meal. He will work on eating slowly and see if he feels full enough not to eat a second portion, he is not adding salt to his meals and watching sodium intake to help with blood pressure control  last BP 108/62. He is compliant with his meds and understands that eating less portion will also decrease the fat intake and help with his cholesterol control.     Expected Outcomes Short: continue to maintain weight. Long: maintain weight independently. Short: go to doctors appointment for Cardioversion. Long: maintain workout routine and cardiologist appointments [facility], continue to work on portion control, monitoring sodium and fat intake as well as maintaining compliance with his medications.   LTG continue  working toward healthy guidelines as learned in the program  Core Components/Risk Factors/Patient Goals at Discharge (Final Review):   Goals and Risk Factor Review - 05/17/23 1020       Core Components/Risk Factors/Patient Goals Review   Personal Goals Review Weight Management/Obesity;Hypertension;Increase knowledge of respiratory medications and ability to use respiratory devices properly.    Review Weight remains same, he is aware that he needs to not eat two portion at every meal. He will work on eating slowly and see if he feels full enough not to eat a second portion, he is not adding salt to his meals and watching sodium intake to help with blood pressure control  last BP 108/62. He is compliant with his meds and understands that eating less portion will also decrease the fat intake and help with his cholesterol control.    Expected Outcomes [facility], continue to work on portion control, monitoring sodium and fat intake as well as maintaining compliance with his medications.   LTG continue  working toward healthy guidelines as learned in the program             ITP Comments:  ITP Comments     Row Name 02/07/23 0908 02/26/23 1135 02/28/23 0755 03/07/23 1208 04/04/23 1256   ITP Comments Virtual Visit completed. Patient informed on EP and RD appointment and 6 Minute walk test. Patient also informed of patient health questionnaires on My Chart. Patient Verbalizes understanding. Visit diagnosis can be found in [facility] 01/12/[facility]. Completed and gym orientation. Initial ITP created and sent for review to Dr. Bethann Punches, Medical Director. First full day of exercise!  Patient was oriented to gym and equipment including functions, settings, policies, and procedures.  Patient's individual exercise prescription and treatment plan were reviewed.  All starting workloads were established based on the results of the 6 minute walk test done at initial orientation visit.  The plan for exercise progression was also introduced and progression will be customized  based on patient's performance and goals. 30 Day review completed. Medical Director ITP review done, changes made as directed, and signed approval by Medical Director.    new to program 30 Day review completed. Medical Director ITP review done, changes made as directed, and signed approval by Medical Director.    Row Name 04/25/23 1144 05/23/23 1005         ITP Comments 30 Day review completed. Medical Director ITP review done, changes made as directed, and signed approval by Medical Director. 30 Day review completed. Medical Director ITP review done, changes made as directed, and signed approval by Medical Director.               Comments:

## 2023-05-23 NOTE — Progress Notes (Signed)
Daily Session Note  Patient Details  Name: Keith Torres. MRN: [id number] Date of Birth: 1965-09-01 Referring Provider:   Flowsheet Row Cardiac Rehab from 02/26/2023 in [facility] Cardiac and Pulmonary Rehab  Referring Provider Dr. Clotilde Dieter, DO       Encounter Date: 05/23/2023  Check In:  Session Check In - 05/23/23 0737       Check-In   Supervising physician immediately available to respond to emergencies See telemetry face sheet for immediately available ER MD    Location [facility]-Cardiac & Pulmonary Rehab    Staff Present Elige Ko, RCP,RRT,BSRT;Maxon Conetta BS, , Exercise Physiologist;Kristen Coble, RN,BC,MSN;Laelia Angelo Katrinka Blazing, RN, ADN    Virtual Visit No    Medication changes reported     No    Fall or balance concerns reported    No    Warm-up and Cool-down Performed on first and last piece of equipment    Resistance Training Performed Yes    VAD Patient? No    PAD/SET Patient? No      Pain Assessment   Currently in Pain? No/denies                Social History   Tobacco Use  Smoking Status Never  Smokeless Tobacco Former   Types: Chew   Quit date: 1999    Goals Met:  Independence with exercise equipment Exercise tolerated well No report of concerns or symptoms today Strength training completed today  Goals Unmet:  Not Applicable  Comments: Pt able to follow exercise prescription today without complaint.  Will continue to monitor for progression.   6 Minute Walk     Row Name 02/26/23 1136 05/23/23 0745       6 Minute Walk   Phase Initial Discharge    Distance 1010 feet 1600 feet    Distance % Change -- 58.4 %    Distance Feet Change -- 590 ft    Walk Time 6 minutes 6 minutes    # of Rest Breaks 0 0    MPH 1.91 3    METS 3.07 3.9    RPE 13 11    Perceived Dyspnea  2 1    VO2 Peak 10.75 13.6    Symptoms No No    Resting HR 108 bpm 78 bpm    Resting BP 94/60 112/80    Resting Oxygen Saturation  98 % 96 %    Exercise Oxygen Saturation   during 6 min walk 96 % 95 %    Max Ex. HR 133 bpm 93 bpm    Max Ex. BP 108/66 124/80    2 Minute Post BP 98/64 --               Dr. Bethann Punches is Medical Director for [facility] Cardiac Rehabilitation.  Dr. Vida Rigger is Medical Director for [facility] Pulmonary Rehabilitation.

## 2023-05-23 NOTE — Patient Instructions (Signed)
Discharge Patient Instructions  Patient Details  Name: Keith Torres. MRN: [id number] Date of Birth: 10-14-1965 Referring Provider:  Dione Housekeeper, *   Number of Visits: 36  Reason for Discharge:  Patient reached a stable level of exercise. Patient independent in their exercise. Patient has met program and personal goals.   Diagnosis:  ST elevation myocardial infarction (STEMI), unspecified artery [facility])  Status post coronary artery stent placement   Functional Capacity:  6 Minute Walk     Row Name 02/26/23 1136 05/23/23 0745       6 Minute Walk   Phase Initial Discharge    Distance 1010 feet 1600 feet    Distance % Change -- 58.4 %    Distance Feet Change -- 590 ft    Walk Time 6 minutes 6 minutes    # of Rest Breaks 0 0    MPH 1.91 3    METS 3.07 3.9    RPE 13 11    Perceived Dyspnea  2 1    VO2 Peak 10.75 13.6    Symptoms No No    Resting HR 108 bpm 78 bpm    Resting BP 94/60 112/80    Resting Oxygen Saturation  98 % 96 %    Exercise Oxygen Saturation  during 6 min walk 96 % 95 %    Max Ex. HR 133 bpm 93 bpm    Max Ex. BP 108/66 124/80    2 Minute Post BP 98/64 --             Nutrition & Weight - Outcomes:  Pre Biometrics - 02/26/23 1140       Pre Biometrics   Height 6\' 2"  (1.88 m)    Weight 233 lb 8 oz (105.9 kg)    Waist Circumference 41.5 inches    Hip Circumference 43.5 inches    Waist to Hip Ratio 0.95 %    BMI (Calculated) 29.97    Single Leg Stand 10.1 seconds             Post Biometrics - 05/23/23 0747        Post  Biometrics   Height 6\' 2"  (1.88 m)    Weight 234 lb 11.2 oz (106.5 kg)    Waist Circumference 41 inches    Hip Circumference 45 inches    Waist to Hip Ratio 0.91 %    BMI (Calculated) 30.12    Single Leg Stand 30 seconds            Goals reviewed with patient; copy given to patient.

## 2023-05-25 ENCOUNTER — Encounter: Payer: BC Managed Care – PPO | Admitting: *Deleted

## 2023-05-25 DIAGNOSIS — I213 ST elevation (STEMI) myocardial infarction of unspecified site: Secondary | ICD-10-CM

## 2023-05-25 DIAGNOSIS — Z955 Presence of coronary angioplasty implant and graft: Secondary | ICD-10-CM

## 2023-05-25 NOTE — Progress Notes (Signed)
Daily Session Note  Patient Details  Name: Keith Torres. MRN: [id number] Date of Birth: Oct 12, 1965 Referring Provider:   Flowsheet Row Cardiac Rehab from 02/26/2023 in [facility] Cardiac and Pulmonary Rehab  Referring Provider Dr. Clotilde Dieter, DO       Encounter Date: 05/25/2023  Check In:  Session Check In - 05/25/23 0754       Check-In   Supervising physician immediately available to respond to emergencies See telemetry face sheet for immediately available ER MD    Location [facility]-Cardiac & Pulmonary Rehab    Staff Present Cora Collum, RN, BSN, CCRP;Noah Tickle, BS, Exercise Physiologist;Joseph [place], [place]    Virtual Visit No    Medication changes reported     No    Fall or balance concerns reported    No    Warm-up and Cool-down Performed on first and last piece of equipment    Resistance Training Performed Yes    VAD Patient? No    PAD/SET Patient? No      Pain Assessment   Currently in Pain? No/denies                Social History   Tobacco Use  Smoking Status Never  Smokeless Tobacco Former   Types: Chew   Quit date: 1999    Goals Met:  Independence with exercise equipment Exercise tolerated well No report of concerns or symptoms today  Goals Unmet:  Not Applicable  Comments: Pt able to follow exercise prescription today without complaint.  Will continue to monitor for progression.    Dr. Bethann Punches is Medical Director for [facility] Cardiac Rehabilitation.  Dr. Vida Rigger is Medical Director for [facility] Pulmonary Rehabilitation.

## 2023-06-20 ENCOUNTER — Encounter: Payer: Self-pay | Admitting: *Deleted

## 2023-06-20 ENCOUNTER — Encounter: Payer: BC Managed Care – PPO | Attending: Internal Medicine | Admitting: *Deleted

## 2023-06-20 DIAGNOSIS — Z955 Presence of coronary angioplasty implant and graft: Secondary | ICD-10-CM | POA: Diagnosis not present

## 2023-06-20 DIAGNOSIS — I213 ST elevation (STEMI) myocardial infarction of unspecified site: Secondary | ICD-10-CM | POA: Insufficient documentation

## 2023-06-20 NOTE — Progress Notes (Signed)
 Daily Session Note  Patient Details  Name: Keith Torres. MRN: [id number] Date of Birth: Oct 25, 1965 Referring Provider:   Flowsheet Row Cardiac Rehab from 02/26/2023 in [facility]  Referring Provider Dr. Isabell Manzanilla, DO       Encounter Date: 06/20/2023  Check In:  Session Check In - 06/20/23 0757       Check-In   Supervising physician immediately available to respond to emergencies See telemetry face sheet for immediately available ER MD    Location [facility]-Cardiac & Pulmonary Rehab    Staff Present Maud Sorenson, RN, BSN, CCRP;Joseph Hood RCP,RRT,BSRT;Maxon De Graff BS, Exercise Physiologist;Jason Martina Sledge RDN,LDN    Virtual Visit No    Medication changes reported     No    Fall or balance concerns reported    No    Warm-up and Cool-down Performed on first and last piece of equipment    Resistance Training Performed Yes    VAD Patient? No    PAD/SET Patient? No      Pain Assessment   Currently in Pain? No/denies                Social History   Tobacco Use  Smoking Status Never  Smokeless Tobacco Former   Types: Chew   Quit date: 1999    Goals Met:  Independence with exercise equipment Exercise tolerated well No report of concerns or symptoms today  Goals Unmet:  Not Applicable  Comments: Pt able to follow exercise prescription today without complaint.  Will continue to monitor for progression.    Dr. Firman Hughes is Medical Director for [facility].  Dr. Fuad Aleskerov is Medical Director for [facility].

## 2023-06-20 NOTE — Progress Notes (Signed)
 Cardiac Individual Treatment Plan  Patient Details  Name: Keith Torres. MRN: [id number] Date of Birth: 05-05-1966 Referring Provider:   Flowsheet Row Cardiac Rehab from 02/26/2023 in [facility] Cardiac and Pulmonary Rehab  Referring Provider Dr. Isabell Manzanilla, DO       Initial Encounter Date:  Flowsheet Row Cardiac Rehab from 02/26/2023 in [facility] Cardiac and Pulmonary Rehab  Date 02/26/23       Visit Diagnosis: ST elevation myocardial infarction (STEMI), unspecified artery [facility])  Status post coronary artery stent placement  Patient's Home Medications on Admission:  Current Outpatient Medications:    amiodarone  (PACERONE ) 200 MG tablet, Take 200 mg by mouth in the morning., Disp: , Rfl:    apixaban  (ELIQUIS ) 5 MG TABS tablet, Take 5 mg by mouth 2 (two) times daily., Disp: , Rfl:    atorvastatin  (LIPITOR) 20 MG tablet, Take 10 mg by mouth every other day., Disp: , Rfl:    clopidogrel  (PLAVIX ) 75 MG tablet, Take 75 mg by mouth in the morning., Disp: , Rfl:    dapagliflozin propanediol (FARXIGA) 10 MG TABS tablet, Take 10 mg by mouth in the morning., Disp: , Rfl:    furosemide  (LASIX ) 40 MG tablet, Take 40 mg by mouth daily., Disp: , Rfl:    metoprolol  tartrate (LOPRESSOR ) 100 MG tablet, Take 100 mg by mouth 2 (two) times daily., Disp: , Rfl:    sacubitril-valsartan (ENTRESTO) 24-26 MG, Take 1 tablet by mouth 2 (two) times daily., Disp: , Rfl:   Past Medical History: Past Medical History:  Diagnosis Date   Anxiety    Atrial fibrillation (HCC)    Dental crowns present    FRONT TWO TEETH   Deviated septum    Dysrhythmia    A-FIB/ DR Bary Likes   Hyperlipidemia    Hypertrophy of nasal turbinates    Meningitis    Obesity    Rhinitis, allergic    Seasonal allergies    Sleep apnea    C-PAP    Tobacco Use: Social History   Tobacco Use  Smoking Status Never  Smokeless Tobacco Former   Types: Chew   Quit date: 1999    Labs: Review Flowsheet       Latest Ref Rng &  Units 04/10/2017 01/12/2023 01/13/2023  Labs for ITP Cardiac and Pulmonary Rehab  Cholestrol 0 - 200 mg/dL 409  811  914   LDL (calc) 0 - 99 mg/dL 782  956  77   HDL-C >21 mg/dL 52  51  54   Trlycerides <150 mg/dL 308  657  90   Hemoglobin A1c 4.8 - 5.6 % - - 5.4      Exercise Target Goals: Exercise Program Goal: Individual exercise prescription set using results from initial 6 min walk test and THRR while considering  patient's activity barriers and safety.   Exercise Prescription Goal: Initial exercise prescription builds to 30-45 minutes a day of aerobic activity, 2-3 days per week.  Home exercise guidelines will be given to patient during program as part of exercise prescription that the participant will acknowledge.   Education: Aerobic Exercise: - Group verbal and visual presentation on the components of exercise prescription. Introduces F.I.T.T principle from ACSM for exercise prescriptions.  Reviews F.I.T.T. principles of aerobic exercise including progression. Written material given at graduation. Flowsheet Row Cardiac Rehab from 02/26/2023 in [facility] Cardiac and Pulmonary Rehab  Education need identified 02/26/23       Education: Resistance Exercise: - Group verbal and visual presentation on the components  of exercise prescription. Introduces F.I.T.T principle from ACSM for exercise prescriptions  Reviews F.I.T.T. principles of resistance exercise including progression. Written material given at graduation.    Education: Exercise & Equipment Safety: - Individual verbal instruction and demonstration of equipment use and safety with use of the equipment. Flowsheet Row Cardiac Rehab from 02/26/2023 in [facility] Cardiac and Pulmonary Rehab  Date 02/07/23  Educator [facility]  Instruction Review Code 1- Verbalizes Understanding       Education: Exercise Physiology & General Exercise Guidelines: - Group verbal and written instruction with models to review the exercise physiology of the  cardiovascular system and associated critical values. Provides general exercise guidelines with specific guidelines to those with heart or lung disease.    Education: Flexibility, Balance, Mind/Body Relaxation: - Group verbal and visual presentation with interactive activity on the components of exercise prescription. Introduces F.I.T.T principle from ACSM for exercise prescriptions. Reviews F.I.T.T. principles of flexibility and balance exercise training including progression. Also discusses the mind body connection.  Reviews various relaxation techniques to help reduce and manage stress (i.e. Deep breathing, progressive muscle relaxation, and visualization). Balance handout provided to take home. Written material given at graduation.   Activity Barriers & Risk Stratification:  Activity Barriers & Cardiac Risk Stratification - 02/26/23 1139       Activity Barriers & Cardiac Risk Stratification   Activity Barriers Other (comment)    Comments Osteoarthritis knee, Gout    Cardiac Risk Stratification Moderate             6 Minute Walk:  6 Minute Walk     Row Name 02/26/23 1136 05/23/23 0745       6 Minute Walk   Phase Initial Discharge    Distance 1010 feet 1600 feet    Distance % Change -- 58.4 %    Distance Feet Change -- 590 ft    Walk Time 6 minutes 6 minutes    # of Rest Breaks 0 0    MPH 1.91 3    METS 3.07 3.9    RPE 13 11    Perceived Dyspnea  2 1    VO2 Peak 10.75 13.6    Symptoms No No    Resting HR 108 bpm 78 bpm    Resting BP 94/60 112/80    Resting Oxygen Saturation  98 % 96 %    Exercise Oxygen Saturation  during 6 min walk 96 % 95 %    Max Ex. HR 133 bpm 93 bpm    Max Ex. BP 108/66 124/80    2 Minute Post BP 98/64 --             Oxygen Initial Assessment:   Oxygen Re-Evaluation:   Oxygen Discharge (Final Oxygen Re-Evaluation):   Initial Exercise Prescription:  Initial Exercise Prescription - 02/26/23 1100       Date of Initial Exercise  RX and Referring Provider   Date 02/26/23    Referring Provider Dr. Sabina Custovic, DO      Oxygen   Maintain Oxygen Saturation 88% or higher      Treadmill   MPH 1.8    Grade 0    Minutes 15    METs 2.38      Recumbant Bike   Level 2    RPM 50    Watts 36    Minutes 15    METs 3.07      NuStep   Level 2    SPM 80    Minutes  15    METs 3.07      REL-XR   Level 2    Speed 50    Minutes 15    METs 3.07      Track   Laps 30    Minutes 15    METs 3.07      Prescription Details   Frequency (times per week) 3    Duration Progress to 30 minutes of continuous aerobic without signs/symptoms of physical distress      Intensity   THRR 40-80% of Max Heartrate 129-151    Ratings of Perceived Exertion 11-13    Perceived Dyspnea 0-4      Resistance Training   Training Prescription Yes    Weight 5 lb    Reps 10-15             Perform Capillary Blood Glucose checks as needed.  Exercise Prescription Changes:   Exercise Prescription Changes     Row Name 02/26/23 1100 03/15/23 0800 03/29/23 1400 04/12/23 1500 04/26/23 1400     Response to Exercise   Blood Pressure (Admit) 94/60 110/72 116/62 110/68 100/62   Blood Pressure (Exercise) 108/66 128/58 124/68 122/72 --   Blood Pressure (Exit) 98/64 118/62 98/58 98/58  98/66   Heart Rate (Admit) 108 bpm 97 bpm 109 bpm 89 bpm 100 bpm   Heart Rate (Exercise) 133 bpm 131 bpm 134 bpm 121 bpm 121 bpm   Heart Rate (Exit) 122 bpm 105 bpm 108 bpm 84 bpm 90 bpm   Oxygen Saturation (Admit) 98 % -- -- -- --   Oxygen Saturation (Exercise) 96 % -- -- -- --   Rating of Perceived Exertion (Exercise) 13 12 13 13 13    Perceived Dyspnea (Exercise) 2 -- -- 0 --   Symptoms none none none none none   Comments Results First two weeks of exercise -- -- --   Duration -- Progress to 30 minutes of  aerobic without signs/symptoms of physical distress Progress to 30 minutes of  aerobic without signs/symptoms of physical distress Progress  to 30 minutes of  aerobic without signs/symptoms of physical distress Progress to 30 minutes of  aerobic without signs/symptoms of physical distress   Intensity -- THRR unchanged THRR unchanged THRR unchanged THRR unchanged     Progression   Progression -- Continue to progress workloads to maintain intensity without signs/symptoms of physical distress. Continue to progress workloads to maintain intensity without signs/symptoms of physical distress. Continue to progress workloads to maintain intensity without signs/symptoms of physical distress. Continue to progress workloads to maintain intensity without signs/symptoms of physical distress.   Average METs -- 3.3 3.34 3.9 3.9     Resistance Training   Training Prescription -- Yes Yes Yes Yes   Weight -- 5 lb 5 lb 5 lb 5 lb   Reps -- 10-15 10-15 10-15 10-15     Interval Training   Interval Training -- No No No No     Treadmill   MPH -- 3 3 3 3    Grade -- 0 1 0 1   Minutes -- 15 15 15 15    METs -- 3.3 3.71 3.3 3.71     Recumbant Bike   Level -- -- 2 -- --   Watts -- -- 36 -- --   Minutes -- -- 15 -- --   METs -- -- 3.07 -- --     NuStep   Level -- 5 4 4 5    Minutes -- 15 15 15  15  METs -- 4.5 3.7 4.2 4.4     REL-XR   Level -- 2 3  went up to level 4 9 9    Minutes -- 15 15 15 15    METs -- 4.2 2.6 5.3 4.6     T5 Nustep   Level -- -- 2 -- --   Minutes -- -- 15 -- --   METs -- -- 2.7 -- --     Oxygen   Maintain Oxygen Saturation -- 88% or higher 88% or higher 88% or higher 88% or higher    Row Name 05/09/23 1100 05/11/23 0700 05/23/23 1400 06/07/23 1200       Response to Exercise   Blood Pressure (Admit) 108/82 -- 120/70 122/60    Blood Pressure (Exit) 118/68 -- 96/60 104/62    Heart Rate (Admit) 74 bpm -- 83 bpm 95 bpm    Heart Rate (Exercise) 120 bpm -- 116 bpm 100 bpm    Heart Rate (Exit) 77 bpm -- 82 bpm 97 bpm    Oxygen Saturation (Admit) 99 % -- 96 % 96 %    Oxygen Saturation (Exercise) 95 % -- 93 % 96 %     Oxygen Saturation (Exit) 98 % -- 98 % 98 %    Rating of Perceived Exertion (Exercise) 13 -- 13 12    Perceived Dyspnea (Exercise) 0 -- 0 0    Symptoms none -- none none    Duration Progress to 30 minutes of  aerobic without signs/symptoms of physical distress -- Progress to 30 minutes of  aerobic without signs/symptoms of physical distress Progress to 30 minutes of  aerobic without signs/symptoms of physical distress    Intensity THRR unchanged -- THRR unchanged THRR unchanged      Progression   Progression Continue to progress workloads to maintain intensity without signs/symptoms of physical distress. -- Continue to progress workloads to maintain intensity without signs/symptoms of physical distress. Continue to progress workloads to maintain intensity without signs/symptoms of physical distress.    Average METs 4.4 -- 4.37 4.23      Resistance Training   Training Prescription Yes -- Yes Yes    Weight 5 lb -- 5 lb 5 lb    Reps 10-15 -- 10-15 10-15      Interval Training   Interval Training No -- No No      Treadmill   MPH 2.9 -- 3 3    Grade 2 -- 3.5 2    Minutes 15 -- 15 15    METs 4.02 -- 4.75 4.12      Recumbant Bike   Level 7 -- 8 --    Watts 46 -- 68 --    Minutes 15 -- 15 --    METs 3.33 -- -- --      NuStep   Level 8 -- 7 6    Minutes 15 -- 15 15    METs 5.5 -- -- 3.8      REL-XR   Level 6 -- 8 10    Minutes 15 -- 15 15    METs 5.5 -- 5.4 5.8      Home Exercise Plan   Plans to continue exercise at -- Home (comment)  owns treadmill, may purchase aerobic machine Home (comment)  owns treadmill, may purchase aerobic machine Home (comment)  owns treadmill, may purchase aerobic machine    Frequency -- Add 2 additional days to program exercise sessions. Add 2 additional days to program exercise sessions. Add 2 additional  days to program exercise sessions.    Initial Home Exercises Provided -- 05/11/23 05/11/23 05/11/23      Oxygen   Maintain Oxygen Saturation 88% or  higher 88% or higher 88% or higher 88% or higher             Exercise Comments:   Exercise Comments     Row Name 02/28/23 0756           Exercise Comments First full day of exercise!  Patient was oriented to gym and equipment including functions, settings, policies, and procedures.  Patient's individual exercise prescription and treatment plan were reviewed.  All starting workloads were established based on the results of the 6 minute walk test done at initial orientation visit.  The plan for exercise progression was also introduced and progression will be customized based on patient's performance and goals.                Exercise Goals and Review:   Exercise Goals     Row Name 02/26/23 1139             Exercise Goals   Increase Physical Activity Yes       Intervention Provide advice, education, support and counseling about physical activity/exercise needs.;Develop an individualized exercise prescription for aerobic and resistive training based on initial evaluation findings, risk stratification, comorbidities and participant's personal goals.       Expected Outcomes Short Term: Attend rehab on a regular basis to increase amount of physical activity.;Long Term: Add in home exercise to make exercise part of routine and to increase amount of physical activity.;Long Term: Exercising regularly at least 3-5 days a week.       Increase Strength and Stamina Yes       Intervention Provide advice, education, support and counseling about physical activity/exercise needs.;Develop an individualized exercise prescription for aerobic and resistive training based on initial evaluation findings, risk stratification, comorbidities and participant's personal goals.       Expected Outcomes Short Term: Increase workloads from initial exercise prescription for resistance, speed, and METs.;Short Term: Perform resistance training exercises routinely during rehab and add in resistance training at  home;Long Term: Improve cardiorespiratory fitness, muscular endurance and strength as measured by increased METs and functional capacity ( )       Able to understand and use rate of perceived exertion (RPE) scale Yes       Intervention Provide education and explanation on how to use RPE scale       Expected Outcomes Short Term: Able to use RPE daily in rehab to express subjective intensity level;Long Term:  Able to use RPE to guide intensity level when exercising independently       Able to understand and use Dyspnea scale Yes       Intervention Provide education and explanation on how to use Dyspnea scale       Expected Outcomes Short Term: Able to use Dyspnea scale daily in rehab to express subjective sense of shortness of breath during exertion;Long Term: Able to use Dyspnea scale to guide intensity level when exercising independently       Knowledge and understanding of Target Heart Rate Range (THRR) Yes       Intervention Provide education and explanation of THRR including how the numbers were predicted and where they are located for reference       Expected Outcomes Short Term: Able to state/look up THRR;Long Term: Able to use THRR to govern intensity when exercising independently;Short Term:  Able to use daily as guideline for intensity in rehab       Able to check pulse independently Yes       Intervention Review the importance of being able to check your own pulse for safety during independent exercise;Provide education and demonstration on how to check pulse in carotid and radial arteries.       Expected Outcomes Short Term: Able to explain why pulse checking is important during independent exercise;Long Term: Able to check pulse independently and accurately       Understanding of Exercise Prescription Yes       Intervention Provide education, explanation, and written materials on patient's individual exercise prescription       Expected Outcomes Short Term: Able to explain program  exercise prescription;Long Term: Able to explain home exercise prescription to exercise independently                Exercise Goals Re-Evaluation :  Exercise Goals Re-Evaluation     Row Name 02/28/23 0756 03/15/23 0847 03/29/23 1417 04/12/23 1541 04/26/23 1421     Exercise Goal Re-Evaluation   Exercise Goals Review Able to understand and use rate of perceived exertion (RPE) scale;Able to understand and use Dyspnea scale;Knowledge and understanding of Target Heart Rate Range (THRR);Understanding of Exercise Prescription Increase Physical Activity;Increase Strength and Stamina;Understanding of Exercise Prescription Increase Physical Activity;Increase Strength and Stamina;Understanding of Exercise Prescription Increase Physical Activity;Increase Strength and Stamina;Understanding of Exercise Prescription Increase Physical Activity;Increase Strength and Stamina;Understanding of Exercise Prescription   Comments Reviewed RPE and dyspnea scale, THR and program prescription with pt today.  Pt voiced understanding and was given a copy of goals to take home. Keith Torres is off to a good start in the program. He has completed his first 3 session dring this review. During these sessions he was able to increase his level on the T4 nustep from 2 to 5. He was also able to increase his speed on the treadmill from 1.8 mph to 3 mph. He also seems to be doing well with the 5 lb handweights. We will continue to monitor his progress in the program. Keith Torres is doing well in rehab. He has recently been able to increase his level on the XR from level 2 to 3. He also added an incline of 1% to his treadmill workload. We will continue to monitor his progress in the program. Keith Torres continues to make improvements in the program. He was able to increase his level on the XR from level 3 to level 9. He was also able to maintain his intensity on the treadmill and T4 nustep. We will continue to monitor his progress in the program. Keith Torres  continues to do well in the program. He added incline back to his treadmill workload at 1% and a speed of 3 mph. He also improved to level 5 on the T4 nustep and continues to work at level 9 on the XR. He has stayed consistent with 5 lb hand weights for resistance training as well. We will continue to monitor his progress in the program.   Expected Outcomes Short: Use RPE daily to regulate intensity. Long: Follow program prescription in THR. Short: Continue to follow current exercise prescription. Long: Continue exercise to improve strength and stamina. Short: Continue to follow current exercise prescription. Long: Continue exercise to improve strength and stamina. Short: Continue to follow current exercise prescription. Long: Continue exercise to improve strength and stamina. Short: Increase to 6 lb hand weights for resistance training. Long: Continue exercise  to improve strength and stamina.    Row Name 05/09/23 1111 05/11/23 0754 05/23/23 1416 06/07/23 1235       Exercise Goal Re-Evaluation   Exercise Goals Review Increase Physical Activity;Increase Strength and Stamina;Understanding of Exercise Prescription Increase Physical Activity;Increase Strength and Stamina;Understanding of Exercise Prescription;Able to understand and use Dyspnea scale;Knowledge and understanding of Target Heart Rate Range (THRR);Able to check pulse independently;Able to understand and use rate of perceived exertion (RPE) scale Increase Physical Activity;Increase Strength and Stamina;Understanding of Exercise Prescription Increase Physical Activity;Increase Strength and Stamina;Understanding of Exercise Prescription    Comments Keith Torres continues to do well in rehab. He was able to increase his level on the XR from 5 to 8, and increase his level on the recumbent bike from level 2 to 7. He was also able to increase his incline on the treadmill to 2%. We will continue to monitor his progress in the program. Reviewed home exercise with pt  today.  Pt plans to use treadmill at home, and is looking into purchasing an aerobic machine as well for exercise.  Reviewed THR, pulse, RPE, sign and symptoms, pulse oximetery and when to call 911 or MD.  Also discussed weather considerations and indoor options.  Pt voiced understanding. Keith Torres is doing great in rehab. He has recently completed his post-6MWT and was able to increase by 58.4%. He has also been able to increase his incline on the treadmill from 2% to 3.5%. We will continue to monitor his progress in the program. Keith Torres is doing well in rehab. He recently completed his post-6MWT and improved by 590 ft. He was also able to increase his level on the XR from level 8 to 10. We will continue to monitor his progress until he graduates.    Expected Outcomes Short: Continue to progressively increase treadmill workload. Long: Continue exercise to improve strength and stamina. Short: Begin using treadmill on days away from rehab. Long: Continue to exercise independently. Short: Continue to follow current exercise prescription. Long: Continue exercise to improve strength and stamina. Short: Graduate. Long: Continue exercise to improve strength and stamina.             Discharge Exercise Prescription (Final Exercise Prescription Changes):  Exercise Prescription Changes - 06/07/23 1200       Response to Exercise   Blood Pressure (Admit) 122/60    Blood Pressure (Exit) 104/62    Heart Rate (Admit) 95 bpm    Heart Rate (Exercise) 100 bpm    Heart Rate (Exit) 97 bpm    Oxygen Saturation (Admit) 96 %    Oxygen Saturation (Exercise) 96 %    Oxygen Saturation (Exit) 98 %    Rating of Perceived Exertion (Exercise) 12    Perceived Dyspnea (Exercise) 0    Symptoms none    Duration Progress to 30 minutes of  aerobic without signs/symptoms of physical distress    Intensity THRR unchanged      Progression   Progression Continue to progress workloads to maintain intensity without signs/symptoms of  physical distress.    Average METs 4.23      Resistance Training   Training Prescription Yes    Weight 5 lb    Reps 10-15      Interval Training   Interval Training No      Treadmill   MPH 3    Grade 2    Minutes 15    METs 4.12      NuStep   Level 6    Minutes  15    METs 3.8      REL-XR   Level 10    Minutes 15    METs 5.8      Home Exercise Plan   Plans to continue exercise at Home (comment)   owns treadmill, may purchase aerobic machine   Frequency Add 2 additional days to program exercise sessions.    Initial Home Exercises Provided 05/11/23      Oxygen   Maintain Oxygen Saturation 88% or higher             Nutrition:  Target Goals: Understanding of nutrition guidelines, daily intake of sodium 1500mg , cholesterol 200mg , calories 30% from fat and 7% or less from saturated fats, daily to have 5 or more servings of fruits and vegetables.  Education: All About Nutrition: -Group instruction provided by verbal, written material, interactive activities, discussions, models, and posters to present general guidelines for heart healthy nutrition including fat, fiber, MyPlate, the role of sodium in heart healthy nutrition, utilization of the nutrition label, and utilization of this knowledge for meal planning. Follow up email sent as well. Written material given at graduation. Flowsheet Row Cardiac Rehab from 02/26/2023 in [facility] Cardiac and Pulmonary Rehab  Education need identified 02/26/23       Biometrics:  Pre Biometrics - 02/26/23 1140       Pre Biometrics   Height 6\' 2"  (1.88 m)    Weight 233 lb 8 oz (105.9 kg)    Waist Circumference 41.5 inches    Hip Circumference 43.5 inches    Waist to Hip Ratio 0.95 %    BMI (Calculated) 29.97    Single Leg Stand 10.1 seconds             Post Biometrics - 05/23/23 0747        Post  Biometrics   Height 6\' 2"  (1.88 m)    Weight 234 lb 11.2 oz (106.5 kg)    Waist Circumference 41 inches    Hip  Circumference 45 inches    Waist to Hip Ratio 0.91 %    BMI (Calculated) 30.12    Single Leg Stand 30 seconds             Nutrition Therapy Plan and Nutrition Goals:  Nutrition Therapy & Goals - 02/26/23 1147       Intervention Plan   Intervention Prescribe, educate and counsel regarding individualized specific dietary modifications aiming towards targeted core components such as weight, hypertension, lipid management, diabetes, heart failure and other comorbidities.    Expected Outcomes Short Term Goal: Understand basic principles of dietary content, such as calories, fat, sodium, cholesterol and nutrients.;Short Term Goal: A plan has been developed with personal nutrition goals set during dietitian appointment.;Long Term Goal: Adherence to prescribed nutrition plan.             Nutrition Assessments:  MEDIFICTS Score Key: >=70 Need to make dietary changes  40-70 Heart Healthy Diet <= 40 Therapeutic Level Cholesterol Diet  Flowsheet Row Cardiac Rehab from 05/25/2023 in [facility] Cardiac [facility] Pulmonary Rehab  Picture Your Plate Total Score on Admission 47  Picture Your Plate Total Score on Discharge 60      Picture Your Plate Scores: <16 Unhealthy dietary pattern with much room for improvement. 41-50 Dietary pattern unlikely to meet recommendations for good health and room for improvement. 51-60 More healthful dietary pattern, with some room for improvement.  >60 Healthy dietary pattern, although there may be some specific behaviors that could be improved.  Nutrition Goals Re-Evaluation:  Nutrition Goals Re-Evaluation     Row Name 03/19/23 0810 04/20/23 0750 05/17/23 1006         Goals   Current Weight -- 237 lb (107.5 kg) --     Nutrition Goal -- Eat more healthy Eat more healthy   decrease portion sizes     Comment He reports he was eating only veggies when he got out of the hospital becasue he knew meat was bad for him. Reports he is loves meat, and has gone back  to eating it regularly. Spoke with him about not commiting to all veggies or all carbs or all meat diets, but rather make more balanced plates with smaller portions of lean meat with controlled portions of carbs and larger portions of non starchy veggies. Keith Torres states that he over eats at times and wants to work on that. He eats his first plate because he was hungry then the second plate because it was good. He is going to rty to cut back on portion sizes. Keith Torres states that he  is still eating 2 portions at meal time. first portion he was hungry then the second plate because it was good.  He is working on decreasing the portion and has not been 100% successful yet.  He will try ot eat slower and see if he feels full enough to not eat the second portion.     Expected Outcome Short: work on reducing portions of meat, choosing leaner cuts, and including more colorful produce on plates. Long: maintain a heart healthy diet with balanced plates Short: work on portion sizes. Long: maintain a caloric intake that works for him. Short: work on portion sizes, eating  first portion slower . Long: able to eat one portion only              Nutrition Goals Discharge (Final Nutrition Goals Re-Evaluation):  Nutrition Goals Re-Evaluation - 05/17/23 1006       Goals   Nutrition Goal Eat more healthy   decrease portion sizes    Comment Keith Torres states that he  is still eating 2 portions at meal time. first portion he was hungry then the second plate because it was good.  He is working on decreasing the portion and has not been 100% successful yet.  He will try ot eat slower and see if he feels full enough to not eat the second portion.    Expected Outcome Short: work on portion sizes, eating  first portion slower . Long: able to eat one portion only             Psychosocial: Target Goals: Acknowledge presence or absence of significant depression and/or stress, maximize coping skills, provide positive support  system. Participant is able to verbalize types and ability to use techniques and skills needed for reducing stress and depression.   Education: Stress, Anxiety, and Depression - Group verbal and visual presentation to define topics covered.  Reviews how body is impacted by stress, anxiety, and depression.  Also discusses healthy ways to reduce stress and to treat/manage anxiety and depression.  Written material given at graduation. Flowsheet Row Cardiac Rehab from 02/26/2023 in [facility] Cardiac and Pulmonary Rehab  Education need identified 02/26/23       Education: Sleep Hygiene -Provides group verbal and written instruction about how sleep can affect your health.  Define sleep hygiene, discuss sleep cycles and impact of sleep habits. Review good sleep hygiene tips.    Initial Review & Psychosocial  Screening:  Initial Psych Review & Screening - 02/07/23 0910       Initial Review   Current issues with None Identified      Family Dynamics   Good Support System? Yes    Comments He can look to his wife and  his son for support.  He has a good outlook on his health and is ready to start rehab .      Barriers   Psychosocial barriers to participate in program There are no identifiable barriers or psychosocial needs.;The patient should benefit from training in stress management and relaxation.      Screening Interventions   Interventions Encouraged to exercise;To provide support and resources with identified psychosocial needs;Provide feedback about the scores to participant    Expected Outcomes Short Term goal: Utilizing psychosocial counselor, staff and physician to assist with identification of specific Stressors or current issues interfering with healing process. Setting desired goal for each stressor or current issue identified.;Long Term Goal: Stressors or current issues are controlled or eliminated.;Long Term goal: The participant improves quality of Life and PHQ9 Scores as seen by post  scores and/or verbalization of changes;Short Term goal: Identification and review with participant of any Quality of Life or Depression concerns found by scoring the questionnaire.             Quality of Life Scores:   Quality of Life - 05/25/23 0811       Quality of Life Scores   Health/Function Pre 18.9 %    Health/Function Post 27.2 %    Health/Function % Change 43.92 %    Socioeconomic Pre 30 %    Socioeconomic Post 29.14 %    Socioeconomic % Change  -2.87 %    Psych/Spiritual Pre 28.07 %    Psych/Spiritual Post 30 %    Psych/Spiritual % Change 6.88 %    Family Pre 27.6 %    Family Post 28.8 %    Family % Change 4.35 %    GLOBAL Pre 24.18 %    GLOBAL Post 28.41 %    GLOBAL % Change 17.49 %            Scores of 19 and below usually indicate a poorer quality of life in these areas.  A difference of  2-3 points is a clinically meaningful difference.  A difference of 2-3 points in the total score of the Quality of Life Index has been associated with significant improvement in overall quality of life, self-image, physical symptoms, and general health in studies assessing change in quality of life.  PHQ-9: Review Flowsheet       05/25/2023 03/21/2023 02/26/2023  Depression screen PHQ 2/9  Decreased Interest 0 0 0  Down, Depressed, Hopeless 0 0 0  PHQ - 2 Score 0 0 0  Altered sleeping 0 0 1  Tired, decreased energy 1 3 3   Change in appetite 1 0 0  Feeling bad or failure about yourself  0 0 0  Trouble concentrating 0 0 0  Moving slowly or fidgety/restless 0 0 1  Suicidal thoughts 0 0 0  PHQ-9 Score 2 3 5   Difficult doing work/chores - Not difficult at all Very difficult   Interpretation of Total Score  Total Score Depression Severity:  1-4 = Minimal depression, 5-9 = Mild depression, 10-14 = Moderate depression, 15-19 = Moderately severe depression, 20-27 = Severe depression   Psychosocial Evaluation and Intervention:  Psychosocial Evaluation - 02/07/23 7829  Psychosocial Evaluation & Interventions   Interventions Relaxation education;Stress management education;Encouraged to exercise with the program and follow exercise prescription    Comments He can look to his wife and his son for support. He has a good outlook on his health and is ready to start rehab .    Expected Outcomes Short: Start [facility] to help with mood. Long: Maintain a healthy mental state    Continue Psychosocial Services  Follow up required by staff             Psychosocial Re-Evaluation:  Psychosocial Re-Evaluation     Row Name 03/21/23 0738 04/20/23 0748 05/17/23 1006         Psychosocial Re-Evaluation   Current issues with None Identified None Identified --     Comments Reviewed patient health questionnaire (PHQ-9) with patient for follow up. Previously, patients score indicated signs/symptoms of depression.  Reviewed to see if patient is improving symptom wise while in program.  Score improved and patient states that it is because he has been able to go back to work. Patient reports no issues with their current mental states, sleep, stress, depression or anxiety. Will follow up with patient in a few weeks for any changes. Continues with no barriers or concerns with psychosocial issues     Expected Outcomes Short: Continue to attend [facility] regularly for regular exercise and social engagement. Long: Continue to improve symptoms and manage a positive mental state. Short: Continue to exercise regularly to support mental health and notify staff of any changes. Long: maintain mental health and well being through teaching of rehab or prescribed medications independently. Short: Continue to exercise regularly to support mental health and notify staff of any changes. Long: maintain mental health and well being through teaching of rehab or prescribed medications independently.     Interventions Encouraged to attend Cardiac Rehabilitation for the exercise Encouraged to  attend Cardiac Rehabilitation for the exercise Encouraged to attend Cardiac Rehabilitation for the exercise     Continue Psychosocial Services  Follow up required by staff Follow up required by staff Follow up required by staff              Psychosocial Discharge (Final Psychosocial Re-Evaluation):  Psychosocial Re-Evaluation - 05/17/23 1006       Psychosocial Re-Evaluation   Comments Continues with no barriers or concerns with psychosocial issues    Expected Outcomes Short: Continue to exercise regularly to support mental health and notify staff of any changes. Long: maintain mental health and well being through teaching of rehab or prescribed medications independently.    Interventions Encouraged to attend Cardiac Rehabilitation for the exercise    Continue Psychosocial Services  Follow up required by staff             Vocational Rehabilitation: Provide vocational rehab assistance to qualifying candidates.   Vocational Rehab Evaluation & Intervention:  Vocational Rehab - 05/25/23 0811       Discharge Vocational Rehab   Discharge Vocational Rehabilitation no VR requested             Education: Education Goals: Education classes will be provided on a variety of topics geared toward better understanding of heart health and risk factor modification. Participant will state understanding/return demonstration of topics presented as noted by education test scores.  Learning Barriers/Preferences:  Learning Barriers/Preferences - 02/07/23 1610       Learning Barriers/Preferences   Learning Barriers None    Learning Preferences None  General Cardiac Education Topics:  AED/CPR: - Group verbal and written instruction with the use of models to demonstrate the basic use of the AED with the basic ABC's of resuscitation.   Anatomy and Cardiac Procedures: - Group verbal and visual presentation and models provide information about basic cardiac anatomy and  function. Reviews the testing methods done to diagnose heart disease and the outcomes of the test results. Describes the treatment choices: Medical Management, Angioplasty, or Coronary Bypass Surgery for treating various heart conditions including Myocardial Infarction, Angina, Valve Disease, and Cardiac Arrhythmias.  Written material given at graduation. Flowsheet Row Cardiac Rehab from 02/26/2023 in [facility] Cardiac and Pulmonary Rehab  Education need identified 02/26/23       Medication Safety: - Group verbal and visual instruction to review commonly prescribed medications for heart and lung disease. Reviews the medication, class of the drug, and side effects. Includes the steps to properly store meds and maintain the prescription regimen.  Written material given at graduation.   Intimacy: - Group verbal instruction through game format to discuss how heart and lung disease can affect sexual intimacy. Written material given at graduation..   Know Your Numbers and Heart Failure: - Group verbal and visual instruction to discuss disease risk factors for cardiac and pulmonary disease and treatment options.  Reviews associated critical values for Overweight/Obesity, Hypertension, Cholesterol, and Diabetes.  Discusses basics of heart failure: signs/symptoms and treatments.  Introduces Heart Failure Zone chart for action plan for heart failure.  Written material given at graduation.   Infection Prevention: - Provides verbal and written material to individual with discussion of infection control including proper hand washing and proper equipment cleaning during exercise session. Flowsheet Row Cardiac Rehab from 02/26/2023 in [facility] Cardiac and Pulmonary Rehab  Date 02/07/23  Educator [facility]  Instruction Review Code 1- Verbalizes Understanding       Falls Prevention: - Provides verbal and written material to individual with discussion of falls prevention and safety. Flowsheet Row Cardiac Rehab from  02/26/2023 in [facility] Cardiac [facility] Pulmonary Rehab  Date 02/07/23  Educator [facility]  Instruction Review Code 1- Verbalizes Understanding       Other: -Provides group and verbal instruction on various topics (see comments)   Knowledge Questionnaire Score:  Knowledge Questionnaire Score - 05/25/23 0811       Knowledge Questionnaire Score   Pre Score 17/26    Post Score 25/26             Core Components/Risk Factors/Patient Goals at Admission:  Personal Goals and Risk Factors at Admission - 02/07/23 0909       Core Components/Risk Factors/Patient Goals on Admission    Weight Management Yes;Weight Maintenance    Intervention Weight Management: Develop a combined nutrition and exercise program designed to reach desired caloric intake, while maintaining appropriate intake of nutrient and fiber, sodium and fats, and appropriate energy expenditure required for the weight goal.;Weight Management: Provide education and appropriate resources to help participant work on and attain dietary goals.;Weight Management/Obesity: Establish reasonable short term and long term weight goals.    Expected Outcomes Short Term: Continue to assess and modify interventions until short term weight is achieved;Long Term: Adherence to nutrition and physical activity/exercise program aimed toward attainment of established weight goal;Weight Maintenance: Understanding of the daily nutrition guidelines, which includes 25-35% calories from fat, 7% or less cal from saturated fats, less than 200mg  cholesterol, less than 1.5gm of sodium, & 5 or more servings of fruits and vegetables daily;Understanding recommendations for meals  to include 15-35% energy as protein, 25-35% energy from fat, 35-60% energy from carbohydrates, less than 200mg  of dietary cholesterol, 20-35 gm of total fiber daily;Understanding of distribution of calorie intake throughout the day with the consumption of 4-5 meals/snacks    Hypertension Yes     Intervention Provide education on lifestyle modifcations including regular physical activity/exercise, weight management, moderate sodium restriction and increased consumption of fresh fruit, vegetables, and low fat dairy, alcohol moderation, and smoking cessation.;Monitor prescription use compliance.    Expected Outcomes Short Term: Continued assessment and intervention until BP is < 140/67mm HG in hypertensive participants. < 130/43mm HG in hypertensive participants with diabetes, heart failure or chronic kidney disease.;Long Term: Maintenance of blood pressure at goal levels.    Lipids Yes    Intervention Provide education and support for participant on nutrition & aerobic/resistive exercise along with prescribed medications to achieve LDL 70mg , HDL >40mg .    Expected Outcomes Short Term: Participant states understanding of desired cholesterol values and is compliant with medications prescribed. Participant is following exercise prescription and nutrition guidelines.;Long Term: Cholesterol controlled with medications as prescribed, with individualized exercise RX and with personalized nutrition plan. Value goals: LDL < 70mg , HDL > 40 mg.             Education:Diabetes - Individual verbal and written instruction to review signs/symptoms of diabetes, desired ranges of glucose level fasting, after meals and with exercise. Acknowledge that pre and post exercise glucose checks will be done for 3 sessions at entry of program.   Core Components/Risk Factors/Patient Goals Review:   Goals and Risk Factor Review     Row Name 03/21/23 0735 04/20/23 0756 05/17/23 1020         Core Components/Risk Factors/Patient Goals Review   Personal Goals Review Weight Management/Obesity;Hypertension Other Weight Management/Obesity;Hypertension;Increase knowledge of respiratory medications and ability to use respiratory devices properly.     Review Keith Torres is down 20 pounds since he had is heart event. He is trying  to maintain his weight currently. He is checking his blood pressure at home most of the days. His pressure is doing well in the program. He has no questions on his medications. Keith Torres is going to to his doctors appointment in the upcoming week for Cardioversion. He cannot tell that he is in afib and states that he may need a defribulator or pacemaker. He is doing well with his rehab and is working our routinely. Weight remains same, he is aware that he needs to not eat two portion at every meal. He will work on eating slowly and see if he feels full enough not to eat a second portion, he is not adding salt to his meals and watching sodium intake to help with blood pressure control  last BP 108/62. He is compliant with his meds and understands that eating less portion will also decrease the fat intake and help with his cholesterol control.     Expected Outcomes Short: continue to maintain weight. Long: maintain weight independently. Short: go to doctors appointment for Cardioversion. Long: maintain workout routine and cardiologist appointments [facility], continue to work on portion control, monitoring sodium and fat intake as well as maintaining compliance with his medications.   LTG continue  working toward healthy guidelines as learned in the program              Core Components/Risk Factors/Patient Goals at Discharge (Final Review):   Goals and Risk Factor Review - 05/17/23 1020       Core  Components/Risk Factors/Patient Goals Review   Personal Goals Review Weight Management/Obesity;Hypertension;Increase knowledge of respiratory medications and ability to use respiratory devices properly.    Review Weight remains same, he is aware that he needs to not eat two portion at every meal. He will work on eating slowly and see if he feels full enough not to eat a second portion, he is not adding salt to his meals and watching sodium intake to help with blood pressure control  last BP 108/62. He is compliant with  his meds and understands that eating less portion will also decrease the fat intake and help with his cholesterol control.    Expected Outcomes [facility], continue to work on portion control, monitoring sodium and fat intake as well as maintaining compliance with his medications.   LTG continue  working toward healthy guidelines as learned in the program             ITP Comments:  ITP Comments     Row Name 02/07/23 0908 02/26/23 1135 02/28/23 0755 03/07/23 1208 04/04/23 1256   ITP Comments Virtual Visit completed. Patient informed on EP and RD appointment and 6 Minute walk test. Patient also informed of patient health questionnaires on My Chart. Patient Verbalizes understanding. Visit diagnosis [facility] found in [facility]/12/[facility]. Completed and gym orientation. Initial ITP created and sent for review to Dr. Firman Hughes, Medical Director. First full day of exercise!  Patient was oriented to gym and equipment including functions, settings, policies, and procedures.  Patient's individual exercise prescription and treatment plan were reviewed.  All starting workloads were established based on the results of the 6 minute walk test done at initial orientation visit.  The plan for exercise progression was also introduced and progression will be customized based on patient's performance and goals. 30 Day review completed. Medical Director ITP review done, changes made as directed, and signed approval by Medical Director.    new to program 30 Day review completed. Medical Director ITP review done, changes made as directed, and signed approval by Medical Director.    Row Name 04/25/23 1144 05/23/23 1005 06/20/23 1311       ITP Comments 30 Day review completed. Medical Director ITP review done, changes made as directed, and signed approval by Medical Director. 30 Day review completed. Medical Director ITP review done, changes made as directed, and signed approval by Medical Director. 30 Day review completed. Medical  Director ITP review done, changes made as directed, and signed approval by Medical Director.              Comments:

## 2023-06-22 ENCOUNTER — Encounter: Payer: BC Managed Care – PPO | Admitting: *Deleted

## 2023-06-22 DIAGNOSIS — I213 ST elevation (STEMI) myocardial infarction of unspecified site: Secondary | ICD-10-CM | POA: Diagnosis not present

## 2023-06-22 DIAGNOSIS — Z955 Presence of coronary angioplasty implant and graft: Secondary | ICD-10-CM

## 2023-06-22 NOTE — Progress Notes (Signed)
Daily Session Note  Patient Details  Name: Keith Torres. MRN: [id number] Date of Birth: 12-17-1965 Referring Provider:   Flowsheet Row Cardiac Rehab from 02/26/2023 in [facility] Cardiac and Pulmonary Rehab  Referring Provider Dr. Clotilde Dieter, DO       Encounter Date: 06/22/2023  Check In:  Session Check In - 06/22/23 0740       Check-In   Supervising physician immediately available to respond to emergencies See telemetry face sheet for immediately available ER MD    Location [facility]-Cardiac & Pulmonary Rehab    Staff Present Ronette Deter, BS, Exercise Physiologist;Joseph Phylis Bougie RN,BSN    Virtual Visit No    Medication changes reported     No    Fall or balance concerns reported    No    Tobacco Cessation No Change    Warm-up and Cool-down Performed on first and last piece of equipment    Resistance Training Performed Yes    VAD Patient? No    PAD/SET Patient? No      Pain Assessment   Currently in Pain? No/denies                Social History   Tobacco Use  Smoking Status Never  Smokeless Tobacco Former   Types: Chew   Quit date: 1999    Goals Met:  Independence with exercise equipment Exercise tolerated well No report of concerns or symptoms today Strength training completed today  Goals Unmet:  Not Applicable  Comments: Pt able to follow exercise prescription today without complaint.  Will continue to monitor for progression.    Dr. Bethann Punches is Medical Director for [facility] Cardiac Rehabilitation.  Dr. Vida Rigger is Medical Director for [facility] Pulmonary Rehabilitation.

## 2023-06-25 ENCOUNTER — Encounter: Payer: BC Managed Care – PPO | Admitting: *Deleted

## 2023-06-25 DIAGNOSIS — I213 ST elevation (STEMI) myocardial infarction of unspecified site: Secondary | ICD-10-CM

## 2023-06-25 DIAGNOSIS — Z955 Presence of coronary angioplasty implant and graft: Secondary | ICD-10-CM

## 2023-06-25 NOTE — Progress Notes (Signed)
Daily Session Note  Patient Details  Name: Keith Torres. MRN: [id number] Date of Birth: [birth year] Referring Provider:   Flowsheet Row Cardiac Rehab from 02/26/2023 in [facility] Cardiac and Pulmonary Rehab  Referring Provider Dr. Clotilde Dieter, DO       Encounter Date: 06/25/2023  Check In:  Session Check In - 06/25/23 0732       Check-In   Supervising physician immediately available to respond to emergencies See telemetry face sheet for immediately available ER MD    Location [facility]-Cardiac & Pulmonary Rehab    Staff Present Susann Givens RN,BSN;Joseph Reino Kent RCP,RRT,BSRT;Jason Wallace Cullens RDN,LDN;Kelly Madilyn Fireman BS, ACSM CEP, Exercise Physiologist    Virtual Visit No    Medication changes reported     No    Fall or balance concerns reported    No    Warm-up and Cool-down Performed on first and last piece of equipment    Resistance Training Performed Yes    VAD Patient? No    PAD/SET Patient? No      Pain Assessment   Currently in Pain? No/denies                Social History   Tobacco Use  Smoking Status Never  Smokeless Tobacco Former   Types: Chew   Quit date: 1999    Goals Met:  Independence with exercise equipment Exercise tolerated well No report of concerns or symptoms today Strength training completed today  Goals Unmet:  Not Applicable  Comments: Pt able to follow exercise prescription today without complaint.  Will continue to monitor for progression.    Dr. Bethann Punches is Medical Director for [facility] Cardiac Rehabilitation.  Dr. Vida Rigger is Medical Director for [facility] Pulmonary Rehabilitation.

## 2023-06-27 ENCOUNTER — Encounter: Payer: BC Managed Care – PPO | Admitting: *Deleted

## 2023-06-27 DIAGNOSIS — I213 ST elevation (STEMI) myocardial infarction of unspecified site: Secondary | ICD-10-CM | POA: Diagnosis not present

## 2023-06-27 DIAGNOSIS — Z955 Presence of coronary angioplasty implant and graft: Secondary | ICD-10-CM

## 2023-06-27 NOTE — Progress Notes (Signed)
Daily Session Note  Patient Details  Name: Donavon Gerhold. MRN: [id number] Date of Birth: 10-Jun-1965 Referring Provider:   Flowsheet Row Cardiac Rehab from 02/26/2023 in [facility] Cardiac and Pul[facility] Re[facility]  Referring Provider Dr. Clotilde Dieter, DO       Encounter Date: 06/27/2023  Check In:  Session Check In - 06/27/23 0757       Check-In   Supervising physician immediately available to respond to emergencies See telemetry face sheet for immediately available ER MD    Location [facility]-Cardiac & Pulmonary Rehab    Staff Present Maxon Conetta BS, Exercise Physiologist;Noah Tickle, BS, Exercise Physiologist;Lucianna Ostlund, RN, BSN, CCRP    Virtual Visit No    Medication changes reported     No    Fall or balance concerns reported    No    Warm-up and Cool-down Performed on first and last piece of equipment    Resistance Training Performed Yes    VAD Patient? No    PAD/SET Patient? No      Pain Assessment   Currently in Pain? No/denies                Social History   Tobacco Use  Smoking Status Never  Smokeless Tobacco Former   Types: Chew   Quit date: 1999    Goals Met:  Independence with exercise equipment Exercise tolerated well No report of concerns or symptoms today  Goals Unmet:  Not Applicable  Comments: Pt able to follow exercise prescription today without complaint.  Will continue to monitor for progression.    Dr. Bethann Punches is Medical Director for [facility] Cardiac Rehabilitation.  Dr. Vida Rigger is Medical Director for [facility] Pulmonary Rehabilitation.

## 2023-06-29 ENCOUNTER — Encounter: Payer: BC Managed Care – PPO | Admitting: *Deleted

## 2023-06-29 DIAGNOSIS — I213 ST elevation (STEMI) myocardial infarction of unspecified site: Secondary | ICD-10-CM | POA: Diagnosis not present

## 2023-06-29 DIAGNOSIS — Z955 Presence of coronary angioplasty implant and graft: Secondary | ICD-10-CM

## 2023-06-29 NOTE — Progress Notes (Signed)
Daily Session Note  Patient Details  Name: Keith Torres. MRN: [id number] Date of Birth: Aug 25, 1965 Referring Provider:   Flowsheet Row Cardiac Rehab from 02/26/2023 in [facility] Cardiac and Pulmonary Rehab  Referring Provider Dr. Clotilde Dieter, DO       Encounter Date: 06/29/2023  Check In:  Session Check In - 06/29/23 0749       Check-In   Supervising physician immediately available to respond to emergencies See telemetry face sheet for immediately available ER MD    Location [facility]-Cardiac & Pulmonary Rehab    Staff Present Cora Collum, RN, BSN, CCRP;Noah Tickle, BS, Exercise Physiologist;Joseph Hood RCP,RRT,BSRT    Virtual Visit No    Medication changes reported     No    Fall or balance concerns reported    No    Warm-up and Cool-down Performed on first and last piece of equipment    Resistance Training Performed Yes    VAD Patient? No    PAD/SET Patient? No      Pain Assessment   Currently in Pain? No/denies                Social History   Tobacco Use  Smoking Status Never  Smokeless Tobacco Former   Types: Chew   Quit date: 1999    Goals Met:  Independence with exercise equipment Exercise tolerated well No report of concerns or symptoms today  Goals Unmet:  Not Applicable  Comments:  Urias graduated today from  rehab with 36 sessions completed.  Details of the patient's exercise prescription and what He needs to do in order to continue the prescription and progress were discussed with patient.  Patient was given a copy of prescription and goals.  Patient verbalized understanding. Oronde plans to continue to exercise by using treadmill at home.    Dr. Bethann Punches is Medical Director for [facility] Cardiac Rehabilitation.  Dr. Vida Rigger is Medical Director for [facility] Pulmonary Rehabilitation.

## 2023-06-29 NOTE — Progress Notes (Signed)
Cardiac Individual Treatment Plan  Patient Details  Name: Keith Torres. MRN: [id number] Date of Birth: September 25, 1965 Referring Provider:   Flowsheet Row Cardiac Rehab from 02/26/2023 in [facility] Cardiac and Pulmonary Rehab  Referring Provider Dr. Clotilde Dieter, DO       Initial Encounter Date:  Flowsheet Row Cardiac Rehab from 02/26/2023 in [facility] Cardiac and Pulmonary Rehab  Date 02/26/23       Visit Diagnosis: ST elevation myocardial infarction (STEMI), unspecified artery [facility])  Status post coronary artery stent placement  Patient's Home Medications on Admission:  Current Outpatient Medications:    amiodarone (PACERONE) 200 MG tablet, Take 200 mg by mouth in the morning., Disp: , Rfl:    apixaban (ELIQUIS) 5 MG TABS tablet, Take 5 mg by mouth 2 (two) times daily., Disp: , Rfl:    atorvastatin (LIPITOR) 20 MG tablet, Take 10 mg by mouth every other day., Disp: , Rfl:    clopidogrel (PLAVIX) 75 MG tablet, Take 75 mg by mouth in the morning., Disp: , Rfl:    dapagliflozin propanediol (FARXIGA) 10 MG TABS tablet, Take 10 mg by mouth in the morning., Disp: , Rfl:    furosemide (LASIX) 40 MG tablet, Take 40 mg by mouth daily., Disp: , Rfl:    metoprolol tartrate (LOPRESSOR) 100 MG tablet, Take 100 mg by mouth 2 (two) times daily., Disp: , Rfl:    sacubitril-valsartan (ENTRESTO) 24-26 MG, Take 1 tablet by mouth 2 (two) times daily., Disp: , Rfl:   Past Medical History: Past Medical History:  Diagnosis Date   Anxiety    Atrial fibrillation (HCC)    Dental crowns present    FRONT TWO TEETH   Deviated septum    Dysrhythmia    A-FIB/ DR Gwen Pounds   Hyperlipidemia    Hypertrophy of nasal turbinates    Meningitis    Obesity    Rhinitis, allergic    Seasonal allergies    Sleep apnea    C-PAP    Tobacco Use: Social History   Tobacco Use  Smoking Status Never  Smokeless Tobacco Former   Types: Chew   Quit date: 1999    Labs: Review Flowsheet       Latest Ref Rng &  Units 04/10/2017 01/12/2023 01/13/2023  Labs for ITP Cardiac and Pulmonary Rehab  Cholestrol 0 - 200 mg/dL 409  811  914   LDL (calc) 0 - 99 mg/dL 782  956  77   HDL-C >21 mg/dL 52  51  54   Trlycerides <150 mg/dL 308  657  90   Hemoglobin A1c 4.8 - 5.6 % - - 5.4      Exercise Target Goals: Exercise Program Goal: Individual exercise prescription set using results from initial 6 min walk test and THRR while considering  patient's activity barriers and safety.   Exercise Prescription Goal: Initial exercise prescription builds to 30-45 minutes a day of aerobic activity, 2-3 days per week.  Home exercise guidelines will be given to patient during program as part of exercise prescription that the participant will acknowledge.   Education: Aerobic Exercise: - Group verbal and visual presentation on the components of exercise prescription. Introduces F.I.T.T principle from ACSM for exercise prescriptions.  Reviews F.I.T.T. principles of aerobic exercise including progression. Written material given at graduation. Flowsheet Row Cardiac Rehab from 02/26/2023 in [facility] Cardiac and Pulmonary Rehab  Education need identified 02/26/23       Education: Resistance Exercise: - Group verbal and visual presentation on the components  of exercise prescription. Introduces F.I.T.T principle from ACSM for exercise prescriptions  Reviews F.I.T.T. principles of resistance exercise including progression. Written material given at graduation.    Education: Exercise & Equipment Safety: - Individual verbal instruction and demonstration of equipment use and safety with use of the equipment. Flowsheet Row Cardiac Rehab from 02/26/2023 in [facility] Cardiac and Pulmonary Rehab  Date 02/07/23  Educator [facility]  Instruction Review Code 1- Verbalizes Understanding       Education: Exercise Physiology & General Exercise Guidelines: - Group verbal and written instruction with models to review the exercise physiology of the  cardiovascular system and associated critical values. Provides general exercise guidelines with specific guidelines to those with heart or lung disease.    Education: Flexibility, Balance, Mind/Body Relaxation: - Group verbal and visual presentation with interactive activity on the components of exercise prescription. Introduces F.I.T.T principle from ACSM for exercise prescriptions. Reviews F.I.T.T. principles of flexibility and balance exercise training including progression. Also discusses the mind body connection.  Reviews various relaxation techniques to help reduce and manage stress (i.e. Deep breathing, progressive muscle relaxation, and visualization). Balance handout provided to take home. Written material given at graduation.   Activity Barriers & Risk Stratification:  Activity Barriers & Cardiac Risk Stratification - 02/26/23 1139       Activity Barriers & Cardiac Risk Stratification   Activity Barriers Other (comment)    Comments Osteoarthritis knee, Gout    Cardiac Risk Stratification Moderate             6 Minute Walk:  6 Minute Walk     Row Name 02/26/23 1136 05/23/23 0745       6 Minute Walk   Phase Initial Discharge    Distance 1010 feet 1600 feet    Distance % Change -- 58.4 %    Distance Feet Change -- 590 ft    Walk Time 6 minutes 6 minutes    # of Rest Breaks 0 0    MPH 1.91 3    METS 3.07 3.9    RPE 13 11    Perceived Dyspnea  2 1    VO2 Peak 10.75 13.6    Symptoms No No    Resting HR 108 bpm 78 bpm    Resting BP 94/60 112/80    Resting Oxygen Saturation  98 % 96 %    Exercise Oxygen Saturation  during 6 min walk 96 % 95 %    Max Ex. HR 133 bpm 93 bpm    Max Ex. BP 108/66 124/80    2 Minute Post BP 98/64 --             Oxygen Initial Assessment:   Oxygen Re-Evaluation:   Oxygen Discharge (Final Oxygen Re-Evaluation):   Initial Exercise Prescription:  Initial Exercise Prescription - 02/26/23 1100       Date of Initial Exercise  RX and Referring Provider   Date 02/26/23    Referring Provider Dr. Clotilde Dieter, DO      Oxygen   Maintain Oxygen Saturation 88% or higher      Treadmill   MPH 1.8    Grade 0    Minutes 15    METs 2.38      Recumbant Bike   Level 2    RPM 50    Watts 36    Minutes 15    METs 3.07      NuStep   Level 2    SPM 80    Minutes  15    METs 3.07      REL-XR   Level 2    Speed 50    Minutes 15    METs 3.07      Track   Laps 30    Minutes 15    METs 3.07      Prescription Details   Frequency (times per week) 3    Duration Progress to 30 minutes of continuous aerobic without signs/symptoms of physical distress      Intensity   THRR 40-80% of Max Heartrate 129-151    Ratings of Perceived Exertion 11-13    Perceived Dyspnea 0-4      Resistance Training   Training Prescription Yes    Weight 5 lb    Reps 10-15             Perform Capillary Blood Glucose checks as needed.  Exercise Prescription Changes:   Exercise Prescription Changes     Row Name 02/26/23 1100 03/15/23 0800 03/29/23 1400 04/12/23 1500 04/26/23 1400     Response to Exercise   Blood Pressure (Admit) 94/60 110/72 116/62 110/68 100/62   Blood Pressure (Exercise) 108/66 128/58 124/68 122/72 --   Blood Pressure (Exit) 98/64 118/62 98/58 98/58  98/66   Heart Rate (Admit) 108 bpm 97 bpm 109 bpm 89 bpm 100 bpm   Heart Rate (Exercise) 133 bpm 131 bpm 134 bpm 121 bpm 121 bpm   Heart Rate (Exit) 122 bpm 105 bpm 108 bpm 84 bpm 90 bpm   Oxygen Saturation (Admit) 98 % -- -- -- --   Oxygen Saturation (Exercise) 96 % -- -- -- --   Rating of Perceived Exertion (Exercise) 13 12 13 13 13    Perceived Dyspnea (Exercise) 2 -- -- 0 --   Symptoms none none none none none   Comments Results First two weeks of exercise -- -- --   Duration -- Progress to 30 minutes of  aerobic without signs/symptoms of physical distress Progress to 30 minutes of  aerobic without signs/symptoms of physical distress Progress  to 30 minutes of  aerobic without signs/symptoms of physical distress Progress to 30 minutes of  aerobic without signs/symptoms of physical distress   Intensity -- THRR unchanged THRR unchanged THRR unchanged THRR unchanged     Progression   Progression -- Continue to progress workloads to maintain intensity without signs/symptoms of physical distress. Continue to progress workloads to maintain intensity without signs/symptoms of physical distress. Continue to progress workloads to maintain intensity without signs/symptoms of physical distress. Continue to progress workloads to maintain intensity without signs/symptoms of physical distress.   Average METs -- 3.3 3.34 3.9 3.9     Resistance Training   Training Prescription -- Yes Yes Yes Yes   Weight -- 5 lb 5 lb 5 lb 5 lb   Reps -- 10-15 10-15 10-15 10-15     Interval Training   Interval Training -- No No No No     Treadmill   MPH -- 3 3 3 3    Grade -- 0 1 0 1   Minutes -- 15 15 15 15    METs -- 3.3 3.71 3.3 3.71     Recumbant Bike   Level -- -- 2 -- --   Watts -- -- 36 -- --   Minutes -- -- 15 -- --   METs -- -- 3.07 -- --     NuStep   Level -- 5 4 4 5    Minutes -- 15 15 15  15  METs -- 4.5 3.7 4.2 4.4     REL-XR   Level -- 2 3  went up to level 4 9 9    Minutes -- 15 15 15 15    METs -- 4.2 2.6 5.3 4.6     T5 Nustep   Level -- -- 2 -- --   Minutes -- -- 15 -- --   METs -- -- 2.7 -- --     Oxygen   Maintain Oxygen Saturation -- 88% or higher 88% or higher 88% or higher 88% or higher    Row Name 05/09/23 1100 05/11/23 0700 05/23/23 1400 06/07/23 1200 06/21/23 0800     Response to Exercise   Blood Pressure (Admit) 108/82 -- 120/70 122/60 122/60   Blood Pressure (Exit) 118/68 -- 96/60 104/62 104/62   Heart Rate (Admit) 74 bpm -- 83 bpm 95 bpm 95 bpm   Heart Rate (Exercise) 120 bpm -- 116 bpm 100 bpm 100 bpm   Heart Rate (Exit) 77 bpm -- 82 bpm 97 bpm 97 bpm   Oxygen Saturation (Admit) 99 % -- 96 % 96 % 96 %   Oxygen  Saturation (Exercise) 95 % -- 93 % 96 % 96 %   Oxygen Saturation (Exit) 98 % -- 98 % 98 % 98 %   Rating of Perceived Exertion (Exercise) 13 -- 13 12 12    Perceived Dyspnea (Exercise) 0 -- 0 0 0   Symptoms none -- none none none   Duration Progress to 30 minutes of  aerobic without signs/symptoms of physical distress -- Progress to 30 minutes of  aerobic without signs/symptoms of physical distress Progress to 30 minutes of  aerobic without signs/symptoms of physical distress Progress to 30 minutes of  aerobic without signs/symptoms of physical distress   Intensity THRR unchanged -- THRR unchanged THRR unchanged THRR unchanged     Progression   Progression Continue to progress workloads to maintain intensity without signs/symptoms of physical distress. -- Continue to progress workloads to maintain intensity without signs/symptoms of physical distress. Continue to progress workloads to maintain intensity without signs/symptoms of physical distress. Continue to progress workloads to maintain intensity without signs/symptoms of physical distress.   Average METs 4.4 -- 4.37 4.23 4.23     Resistance Training   Training Prescription Yes -- Yes Yes Yes   Weight 5 lb -- 5 lb 5 lb 5 lb   Reps 10-15 -- 10-15 10-15 10-15     Interval Training   Interval Training No -- No No No     Treadmill   MPH 2.9 -- 3 3 3    Grade 2 -- 3.5 2 2    Minutes 15 -- 15 15 15    METs 4.02 -- 4.75 4.12 4.12     Recumbant Bike   Level 7 -- 8 -- --   Watts 46 -- 68 -- --   Minutes 15 -- 15 -- --   METs 3.33 -- -- -- --     NuStep   Level 8 -- 7 6 6    Minutes 15 -- 15 15 15    METs 5.5 -- -- 3.8 3.8     REL-XR   Level 6 -- 8 10 10    Minutes 15 -- 15 15 15    METs 5.5 -- 5.4 5.8 5.8     Home Exercise Plan   Plans to continue exercise at -- Home (comment)  owns treadmill, may purchase aerobic machine Home (comment)  owns treadmill, may purchase aerobic machine Home (comment)  owns treadmill, may purchase aerobic  machine Home (comment)  owns treadmill, may purchase aerobic machine   Frequency -- Add 2 additional days to program exercise sessions. Add 2 additional days to program exercise sessions. Add 2 additional days to program exercise sessions. Add 2 additional days to program exercise sessions.   Initial Home Exercises Provided -- 05/11/23 05/11/23 05/11/23 05/11/23     Oxygen   Maintain Oxygen Saturation 88% or higher 88% or higher 88% or higher 88% or higher 88% or higher            Exercise Comments:   Exercise Comments     Row Name 02/28/23 0756 06/29/23 0751         Exercise Comments First full day of exercise!  Patient was oriented to gym and equipment including functions, settings, policies, and procedures.  Patient's individual exercise prescription and treatment plan were reviewed.  All starting workloads were established based on the results of the 6 minute walk test done at initial orientation visit.  The plan for exercise progression was also introduced and progression will be customized based on patient's performance and goals. Jiovany graduated today from  rehab with 36 sessions completed.  Details of the patient's exercise prescription and what He needs to do in order to continue the prescription and progress were discussed with patient.  Patient was given a copy of prescription and goals.  Patient verbalized understanding. Nikai plans to continue to exercise by using treadmill at home.               Exercise Goals and Review:   Exercise Goals     Row Name 02/26/23 1139             Exercise Goals   Increase Physical Activity Yes       Intervention Provide advice, education, support and counseling about physical activity/exercise needs.;Develop an individualized exercise prescription for aerobic and resistive training based on initial evaluation findings, risk stratification, comorbidities and participant's personal goals.       Expected Outcomes Short Term:  Attend rehab on a regular basis to increase amount of physical activity.;Long Term: Add in home exercise to make exercise part of routine and to increase amount of physical activity.;Long Term: Exercising regularly at least 3-5 days a week.       Increase Strength and Stamina Yes       Intervention Provide advice, education, support and counseling about physical activity/exercise needs.;Develop an individualized exercise prescription for aerobic and resistive training based on initial evaluation findings, risk stratification, comorbidities and participant's personal goals.       Expected Outcomes Short Term: Increase workloads from initial exercise prescription for resistance, speed, and METs.;Short Term: Perform resistance training exercises routinely during rehab and add in resistance training at home;Long Term: Improve cardiorespiratory fitness, muscular endurance and strength as measured by increased METs and functional capacity ( )       Able to understand and use rate of perceived exertion (RPE) scale Yes       Intervention Provide education and explanation on how to use RPE scale       Expected Outcomes Short Term: Able to use RPE daily in rehab to express subjective intensity level;Long Term:  Able to use RPE to guide intensity level when exercising independently       Able to understand and use Dyspnea scale Yes       Intervention Provide education and explanation on how to use Dyspnea scale  Expected Outcomes Short Term: Able to use Dyspnea scale daily in rehab to express subjective sense of shortness of breath during exertion;Long Term: Able to use Dyspnea scale to guide intensity level when exercising independently       Knowledge and understanding of Target Heart Rate Range (THRR) Yes       Intervention Provide education and explanation of THRR including how the numbers were predicted and where they are located for reference       Expected Outcomes Short Term: Able to state/look up  THRR;Long Term: Able to use THRR to govern intensity when exercising independently;Short Term: Able to use daily as guideline for intensity in rehab       Able to check pulse independently Yes       Intervention Review the importance of being able to check your own pulse for safety during independent exercise;Provide education and demonstration on how to check pulse in carotid and radial arteries.       Expected Outcomes Short Term: Able to explain why pulse checking is important during independent exercise;Long Term: Able to check pulse independently and accurately       Understanding of Exercise Prescription Yes       Intervention Provide education, explanation, and written materials on patient's individual exercise prescription       Expected Outcomes Short Term: Able to explain program exercise prescription;Long Term: Able to explain home exercise prescription to exercise independently                Exercise Goals Re-Evaluation :  Exercise Goals Re-Evaluation     Row Name 02/28/23 0756 03/15/23 0847 03/29/23 1417 04/12/23 1541 04/26/23 1421     Exercise Goal Re-Evaluation   Exercise Goals Review Able to understand and use rate of perceived exertion (RPE) scale;Able to understand and use Dyspnea scale;Knowledge and understanding of Target Heart Rate Range (THRR);Understanding of Exercise Prescription Increase Physical Activity;Increase Strength and Stamina;Understanding of Exercise Prescription Increase Physical Activity;Increase Strength and Stamina;Understanding of Exercise Prescription Increase Physical Activity;Increase Strength and Stamina;Understanding of Exercise Prescription Increase Physical Activity;Increase Strength and Stamina;Understanding of Exercise Prescription   Comments Reviewed RPE and dyspnea scale, THR and program prescription with pt today.  Pt voiced understanding and was given a copy of goals to take home. Sharlot Gowda is off to a good start in the program. He has completed  his first 3 session dring this review. During these sessions he was able to increase his level on the T4 nustep from 2 to 5. He was also able to increase his speed on the treadmill from 1.8 mph to 3 mph. He also seems to be doing well with the 5 lb handweights. We will continue to monitor his progress in the program. Sharlot Gowda is doing well in rehab. He has recently been able to increase his level on the XR from level 2 to 3. He also added an incline of 1% to his treadmill workload. We will continue to monitor his progress in the program. Sharlot Gowda continues to make improvements in the program. He was able to increase his level on the XR from level 3 to level 9. He was also able to maintain his intensity on the treadmill and T4 nustep. We will continue to monitor his progress in the program. Tammy Sours continues to do well in the program. He added incline back to his treadmill workload at 1% and a speed of 3 mph. He also improved to level 5 on the T4 nustep and continues to work at  level 9 on the XR. He has stayed consistent with 5 lb hand weights for resistance training as well. We will continue to monitor his progress in the program.   Expected Outcomes Short: Use RPE daily to regulate intensity. Long: Follow program prescription in THR. Short: Continue to follow current exercise prescription. Long: Continue exercise to improve strength and stamina. Short: Continue to follow current exercise prescription. Long: Continue exercise to improve strength and stamina. Short: Continue to follow current exercise prescription. Long: Continue exercise to improve strength and stamina. Short: Increase to 6 lb hand weights for resistance training. Long: Continue exercise to improve strength and stamina.    Row Name 05/09/23 1111 05/11/23 0754 05/23/23 1416 06/07/23 1235 06/21/23 0819     Exercise Goal Re-Evaluation   Exercise Goals Review Increase Physical Activity;Increase Strength and Stamina;Understanding of Exercise Prescription  Increase Physical Activity;Increase Strength and Stamina;Understanding of Exercise Prescription;Able to understand and use Dyspnea scale;Knowledge and understanding of Target Heart Rate Range (THRR);Able to check pulse independently;Able to understand and use rate of perceived exertion (RPE) scale Increase Physical Activity;Increase Strength and Stamina;Understanding of Exercise Prescription Increase Physical Activity;Increase Strength and Stamina;Understanding of Exercise Prescription Increase Physical Activity;Increase Strength and Stamina;Understanding of Exercise Prescription   Comments Tammy Sours continues to do well in rehab. He was able to increase his level on the XR from 5 to 8, and increase his level on the recumbent bike from level 2 to 7. He was also able to increase his incline on the treadmill to 2%. We will continue to monitor his progress in the program. Reviewed home exercise with pt today.  Pt plans to use treadmill at home, and is looking into purchasing an aerobic machine as well for exercise.  Reviewed THR, pulse, RPE, sign and symptoms, pulse oximetery and when to call 911 or MD.  Also discussed weather considerations and indoor options.  Pt voiced understanding. Sharlot Gowda is doing great in rehab. He has recently completed his post-6MWT and was able to increase by 58.4%. He has also been able to increase his incline on the treadmill from 2% to 3.5%. We will continue to monitor his progress in the program. Sharlot Gowda is doing well in rehab. He recently completed his post-6MWT and improved by 590 ft. He was also able to increase his level on the XR from level 8 to 10. We will continue to monitor his progress until he graduates. Sharlot Gowda has not attended since the last review. We will connect with him on when he will return. We will monitor his progress until he graduates when he returns to the program.   Expected Outcomes Short: Continue to progressively increase treadmill workload. Long: Continue exercise to  improve strength and stamina. Short: Begin using treadmill on days away from rehab. Long: Continue to exercise independently. Short: Continue to follow current exercise prescription. Long: Continue exercise to improve strength and stamina. Short: Graduate. Long: Continue exercise to improve strength and stamina. Short: Return to rehab. Long: Continue to attend rehab to improve strength and stamina and graduate from the program    Row Name 06/29/23 0756             Exercise Goal Re-Evaluation   Exercise Goals Review Increase Physical Activity;Increase Strength and Stamina;Understanding of Exercise Prescription       Comments Sharlot Gowda is graduating today. He states that after graduation he plans use treadmill and elliptical that he has at home around 5 days a week. He states that he has noticed his breathing and stamina  have improved since being in the program. We encouraged him to continue to exercise after graduating from the program.       Expected Outcomes Short: Graduate. Long: Continue exercise to improve strength and stamina.                Discharge Exercise Prescription (Final Exercise Prescription Changes):  Exercise Prescription Changes - 06/21/23 0800       Response to Exercise   Blood Pressure (Admit) 122/60    Blood Pressure (Exit) 104/62    Heart Rate (Admit) 95 bpm    Heart Rate (Exercise) 100 bpm    Heart Rate (Exit) 97 bpm    Oxygen Saturation (Admit) 96 %    Oxygen Saturation (Exercise) 96 %    Oxygen Saturation (Exit) 98 %    Rating of Perceived Exertion (Exercise) 12    Perceived Dyspnea (Exercise) 0    Symptoms none    Duration Progress to 30 minutes of  aerobic without signs/symptoms of physical distress    Intensity THRR unchanged      Progression   Progression Continue to progress workloads to maintain intensity without signs/symptoms of physical distress.    Average METs 4.23      Resistance Training   Training Prescription Yes    Weight 5 lb    Reps  10-15      Interval Training   Interval Training No      Treadmill   MPH 3    Grade 2    Minutes 15    METs 4.12      NuStep   Level 6    Minutes 15    METs 3.8      REL-XR   Level 10    Minutes 15    METs 5.8      Home Exercise Plan   Plans to continue exercise at Home (comment)   owns treadmill, may purchase aerobic machine   Frequency Add 2 additional days to program exercise sessions.    Initial Home Exercises Provided 05/11/23      Oxygen   Maintain Oxygen Saturation 88% or higher             Nutrition:  Target Goals: Understanding of nutrition guidelines, daily intake of sodium 1500mg , cholesterol 200mg , calories 30% from fat and 7% or less from saturated fats, daily to have 5 or more servings of fruits and vegetables.  Education: All About Nutrition: -Group instruction provided by verbal, written material, interactive activities, discussions, models, and posters to present general guidelines for heart healthy nutrition including fat, fiber, MyPlate, the role of sodium in heart healthy nutrition, utilization of the nutrition label, and utilization of this knowledge for meal planning. Follow up email sent as well. Written material given at graduation. Flowsheet Row Cardiac Rehab from 02/26/2023 in [facility] Cardiac and Pulmonary Rehab  Education need identified 02/26/23       Biometrics:  Pre Biometrics - 02/26/23 1140       Pre Biometrics   Height 6\' 2"  (1.88 m)    Weight 233 lb 8 oz (105.9 kg)    Waist Circumference 41.5 inches    Hip Circumference 43.5 inches    Waist to Hip Ratio 0.95 %    BMI (Calculated) 29.97    Single Leg Stand 10.1 seconds             Post Biometrics - 05/23/23 0747        Post  Biometrics   Height 6\' 2"  (  1.88 m)    Weight 234 lb 11.2 oz (106.5 kg)    Waist Circumference 41 inches    Hip Circumference 45 inches    Waist to Hip Ratio 0.91 %    BMI (Calculated) 30.12    Single Leg Stand 30 seconds              Nutrition Therapy Plan and Nutrition Goals:  Nutrition Therapy & Goals - 02/26/23 1147       Intervention Plan   Intervention Prescribe, educate and counsel regarding individualized specific dietary modifications aiming towards targeted core components such as weight, hypertension, lipid management, diabetes, heart failure and other comorbidities.    Expected Outcomes Short Term Goal: Understand basic principles of dietary content, such as calories, fat, sodium, cholesterol and nutrients.;Short Term Goal: A plan has been developed with personal nutrition goals set during dietitian appointment.;Long Term Goal: Adherence to prescribed nutrition plan.             Nutrition Assessments:  MEDIFICTS Score Key: >=70 Need to make dietary changes  40-70 Heart Healthy Diet <= 40 Therapeutic Level Cholesterol Diet  Flowsheet Row Cardiac Rehab from 05/25/2023 in [facility] Cardiac and Pulmonary Rehab  Picture Your Plate Total Score on Admission 47  Picture Your Plate Total Score on Discharge 60      Picture Your Plate Scores: <16 Unhealthy dietary pattern with much room for improvement. 41-50 Dietary pattern unlikely to meet recommendations for good health and room for improvement. 51-60 More healthful dietary pattern, with some room for improvement.  >60 Healthy dietary pattern, although there may be some specific behaviors that could be improved.    Nutrition Goals Re-Evaluation:  Nutrition Goals Re-Evaluation     Row Name 03/19/23 0810 04/20/23 0750 05/17/23 1006 06/29/23 0800       Goals   Current Weight -- 237 lb (107.5 kg) -- --    Nutrition Goal -- Eat more healthy Eat more healthy   decrease portion sizes --    Comment He reports he was eating only veggies when he got out of the hospital becasue he knew meat was bad for him. Reports he is loves meat, and has gone back to eating it regularly. Spoke with him about not commiting to all veggies or all carbs or all meat diets, but  rather make more balanced plates with smaller portions of lean meat with controlled portions of carbs and larger portions of non starchy veggies. Sharlot Gowda states that he over eats at times and wants to work on that. He eats his first plate because he was hungry then the second plate because it was good. He is going to rty to cut back on portion sizes. Sharlot Gowda states that he  is still eating 2 portions at meal time. first portion he was hungry then the second plate because it was good.  He is working on decreasing the portion and has not been 100% successful yet.  He will try ot eat slower and see if he feels full enough to not eat the second portion. Sharlot Gowda states that he is still working on portion control and not eating as much. He also working towards not eating as much fried food. He wants to incorporate more vegetables in his diet as well. He reports drinking plenty of water as well.    Expected Outcome Short: work on reducing portions of meat, choosing leaner cuts, and including more colorful produce on plates. Long: maintain a heart healthy diet with balanced plates Short:  work on portion sizes. Long: maintain a caloric intake that works for him. Short: work on portion sizes, eating  first portion slower . Long: able to eat one portion only Short: Continue to work on portion control. Long: Continue heart healthy eating patterns discussed with RD.             Nutrition Goals Discharge (Final Nutrition Goals Re-Evaluation):  Nutrition Goals Re-Evaluation - 06/29/23 0800       Goals   Comment Sharlot Gowda states that he is still working on portion control and not eating as much. He also working towards not eating as much fried food. He wants to incorporate more vegetables in his diet as well. He reports drinking plenty of water as well.    Expected Outcome Short: Continue to work on portion control. Long: Continue heart healthy eating patterns discussed with RD.             Psychosocial: Target Goals:  Acknowledge presence or absence of significant depression and/or stress, maximize coping skills, provide positive support system. Participant is able to verbalize types and ability to use techniques and skills needed for reducing stress and depression.   Education: Stress, Anxiety, and Depression - Group verbal and visual presentation to define topics covered.  Reviews how body is impacted by stress, anxiety, and depression.  Also discusses healthy ways to reduce stress and to treat/manage anxiety and depression.  Written material given at graduation. Flowsheet Row Cardiac Rehab from 02/26/2023 in [facility] Cardiac and Pulmonary Rehab  Education need identified 02/26/23       Education: Sleep Hygiene -Provides group verbal and written instruction about how sleep can affect your health.  Define sleep hygiene, discuss sleep cycles and impact of sleep habits. Review good sleep hygiene tips.    Initial Review & Psychosocial Screening:  Initial Psych Review & Screening - 02/07/23 0910       Initial Review   Current issues with None Identified      Family Dynamics   Good Support System? Yes    Comments He can look to his wife and  his son for support.  He has a good outlook on his health and is ready to start rehab .      Barriers   Psychosocial barriers to participate in program There are no identifiable barriers or psychosocial needs.;The patient should benefit from training in stress management and relaxation.      Screening Interventions   Interventions Encouraged to exercise;To provide support and resources with identified psychosocial needs;Provide feedback about the scores to participant    Expected Outcomes Short Term goal: Utilizing psychosocial counselor, staff and physician to assist with identification of specific Stressors or current issues interfering with healing process. Setting desired goal for each stressor or current issue identified.;Long Term Goal: Stressors or current issues  are controlled or eliminated.;Long Term goal: The participant improves quality of Life and PHQ9 Scores as seen by post scores and/or verbalization of changes;Short Term goal: Identification and review with participant of any Quality of Life or Depression concerns found by scoring the questionnaire.             Quality of Life Scores:   Quality of Life - 05/25/23 0811       Quality of Life Scores   Health/Function Pre 18.9 %    Health/Function Post 27.2 %    Health/Function % Change 43.92 %    Socioeconomic Pre 30 %    Socioeconomic Post 29.14 %  Socioeconomic % Change  -2.87 %    Psych/Spiritual Pre 28.07 %    Psych/Spiritual Post 30 %    Psych/Spiritual % Change 6.88 %    Family Pre 27.6 %    Family Post 28.8 %    Family % Change 4.35 %    GLOBAL Pre 24.18 %    GLOBAL Post 28.41 %    GLOBAL % Change 17.49 %            Scores of 19 and below usually indicate a poorer quality of life in these areas.  A difference of  2-3 points is a clinically meaningful difference.  A difference of 2-3 points in the total score of the Quality of Life Index has been associated with significant improvement in overall quality of life, self-image, physical symptoms, and general health in studies assessing change in quality of life.  PHQ-9: Review Flowsheet       05/25/2023 03/21/2023 02/26/2023  Depression screen PHQ 2/9  Decreased Interest 0 0 0  Down, Depressed, Hopeless 0 0 0  PHQ - 2 Score 0 0 0  Altered sleeping 0 0 1  Tired, decreased energy 1 3 3   Change in appetite 1 0 0  Feeling bad or failure about yourself  0 0 0  Trouble concentrating 0 0 0  Moving slowly or fidgety/restless 0 0 1  Suicidal thoughts 0 0 0  PHQ-9 Score 2 3 5   Difficult doing work/chores - Not difficult at all Very difficult   Interpretation of Total Score  Total Score Depression Severity:  1-4 = Minimal depression, 5-9 = Mild depression, 10-14 = Moderate depression, 15-19 = Moderately severe  depression, 20-27 = Severe depression   Psychosocial Evaluation and Intervention:  Psychosocial Evaluation - 02/07/23 0913       Psychosocial Evaluation & Interventions   Interventions Relaxation education;Stress management education;Encouraged to exercise with the program and follow exercise prescription    Comments He can look to his wife and his son for support. He has a good outlook on his health and is ready to start rehab .    Expected Outcomes Short: Start [facility] to help with mood. Long: Maintain a healthy mental state    Continue Psychosocial Services  Follow up required by staff             Psychosocial Re-Evaluation:  Psychosocial Re-Evaluation     Row Name 03/21/23 [phone] 04/20/23 0748 05/17/23 1006 06/29/23 0758       Psychosocial Re-Evaluation   Current issues with None Identified None Identified -- None Identified    Comments Reviewed patient health questionnaire (PHQ-9) with patient for follow up. Previously, patients score indicated signs/symptoms of depression.  Reviewed to see if patient is improving symptom wise while in program.  Score improved and patient states that it is because he has been able to go back to work. Patient reports no issues with their current mental states, sleep, stress, depression or anxiety. Will follow up with patient in a few weeks for any changes. Continues with no barriers or concerns with psychosocial issues Sharlot Gowda states that he has no major stressors at this time. He reports that work is a little stressful for him but knows he needs to let that go. He has no issues sleeping with his CPAP. He states that he has a good support system made up by his wife, son and grandkids.    Expected Outcomes Short: Continue to attend [facility] regularly for regular exercise and social engagement.  Long: Continue to improve symptoms and manage a positive mental state. Short: Continue to exercise regularly to support mental health and notify staff of any  changes. Long: maintain mental health and well being through teaching of rehab or prescribed medications independently. Short: Continue to exercise regularly to support mental health and notify staff of any changes. Long: maintain mental health and well being through teaching of rehab or prescribed medications independently. Short: Continue to exercise regularly to support mental health and notify staff of any changes. Long: Maintain positive outlook.    Interventions Encouraged to attend Cardiac Rehabilitation for the exercise Encouraged to attend Cardiac Rehabilitation for the exercise Encouraged to attend Cardiac Rehabilitation for the exercise Encouraged to attend Cardiac Rehabilitation for the exercise    Continue Psychosocial Services  Follow up required by staff Follow up required by staff Follow up required by staff Follow up required by staff             Psychosocial Discharge (Final Psychosocial Re-Evaluation):  Psychosocial Re-Evaluation - 06/29/23 0758       Psychosocial Re-Evaluation   Current issues with None Identified    Comments Sharlot Gowda states that he has no major stressors at this time. He reports that work is a little stressful for him but knows he needs to let that go. He has no issues sleeping with his CPAP. He states that he has a good support system made up by his wife, son and grandkids.    Expected Outcomes Short: Continue to exercise regularly to support mental health and notify staff of any changes. Long: Maintain positive outlook.    Interventions Encouraged to attend Cardiac Rehabilitation for the exercise    Continue Psychosocial Services  Follow up required by staff             Vocational Rehabilitation: Provide vocational rehab assistance to qualifying candidates.   Vocational Rehab Evaluation & Intervention:  Vocational Rehab - 05/25/23 0811       Discharge Vocational Rehab   Discharge Vocational Rehabilitation no VR requested              Education: Education Goals: Education classes will be provided on a variety of topics geared toward better understanding of heart health and risk factor modification. Participant will state understanding/return demonstration of topics presented as noted by education test scores.  Learning Barriers/Preferences:  Learning Barriers/Preferences - 02/07/23 3086       Learning Barriers/Preferences   Learning Barriers None    Learning Preferences None             General Cardiac Education Topics:  AED/CPR: - Group verbal and written instruction with the use of models to demonstrate the basic use of the AED with the basic ABC's of resuscitation.   Anatomy and Cardiac Procedures: - Group verbal and visual presentation and models provide information about basic cardiac anatomy and function. Reviews the testing methods done to diagnose heart disease and the outcomes of the test results. Describes the treatment choices: Medical Management, Angioplasty, or Coronary Bypass Surgery for treating various heart conditions including Myocardial Infarction, Angina, Valve Disease, and Cardiac Arrhythmias.  Written material given at graduation. Flowsheet Row Cardiac Rehab from 02/26/2023 in [facility] Cardiac and Pulmonary Rehab  Education need identified 02/26/23       Medication Safety: - Group verbal and visual instruction to review commonly prescribed medications for heart and lung disease. Reviews the medication, class of the drug, and side effects. Includes the steps to properly store meds and maintain  the prescription regimen.  Written material given at graduation.   Intimacy: - Group verbal instruction through game format to discuss how heart and lung disease can affect sexual intimacy. Written material given at graduation..   Know Your Numbers and Heart Failure: - Group verbal and visual instruction to discuss disease risk factors for cardiac and pulmonary disease and treatment options.   Reviews associated critical values for Overweight/Obesity, Hypertension, Cholesterol, and Diabetes.  Discusses basics of heart failure: signs/symptoms and treatments.  Introduces Heart Failure Zone chart for action plan for heart failure.  Written material given at graduation.   Infection Prevention: - Provides verbal and written material to individual with discussion of infection control including proper hand washing and proper equipment cleaning during exercise session. Flowsheet Row Cardiac Rehab from 02/26/2023 in [facility] Cardiac and Pulmonary Rehab  Date 02/07/23  Educator [facility]  Instruction Review Code 1- Verbalizes Understanding       Falls Prevention: - Provides verbal and written material to individual with discussion of falls prevention and safety. Flowsheet Row Cardiac Rehab from 02/26/2023 in [facility] Cardiac and Pulmonary Rehab  Date 02/07/23  Educator [facility]  Instruction Review Code 1- Verbalizes Understanding       Other: -Provides group and verbal instruction on various topics (see comments)   Knowledge Questionnaire Score:  Knowledge Questionnaire Score - 05/25/23 0811       Knowledge Questionnaire Score   Pre Score 17/26    Post Score 25/26             Core Components/Risk Factors/Patient Goals at Admission:  Personal Goals and Risk Factors at Admission - 02/07/23 0909       Core Components/Risk Factors/Patient Goals on Admission    Weight Management Yes;Weight Maintenance    Intervention Weight Management: Develop a combined nutrition and exercise program designed to reach desired caloric intake, while maintaining appropriate intake of nutrient and fiber, sodium and fats, and appropriate energy expenditure required for the weight goal.;Weight Management: Provide education and appropriate resources to help participant work on and attain dietary goals.;Weight Management/Obesity: Establish reasonable short term and long term weight goals.    Expected Outcomes Short  Term: Continue to assess and modify interventions until short term weight is achieved;Long Term: Adherence to nutrition and physical activity/exercise program aimed toward attainment of established weight goal;Weight Maintenance: Understanding of the daily nutrition guidelines, which includes 25-35% calories from fat, 7% or less cal from saturated fats, less than 200mg  cholesterol, less than 1.5gm of sodium, & 5 or more servings of fruits and vegetables daily;Understanding recommendations for meals to include 15-35% energy as protein, 25-35% energy from fat, 35-60% energy from carbohydrates, less than 200mg  of dietary cholesterol, 20-35 gm of total fiber daily;Understanding of distribution of calorie intake throughout the day with the consumption of 4-5 meals/snacks    Hypertension Yes    Intervention Provide education on lifestyle modifcations including regular physical activity/exercise, weight management, moderate sodium restriction and increased consumption of fresh fruit, vegetables, and low fat dairy, alcohol moderation, and smoking cessation.;Monitor prescription use compliance.    Expected Outcomes Short Term: Continued assessment and intervention until BP is < 140/28mm HG in hypertensive participants. < 130/84mm HG in hypertensive participants with diabetes, heart failure or chronic kidney disease.;Long Term: Maintenance of blood pressure at goal levels.    Lipids Yes    Intervention Provide education and support for participant on nutrition & aerobic/resistive exercise along with prescribed medications to achieve LDL 70mg , HDL >40mg .    Expected Outcomes Short  Term: Participant states understanding of desired cholesterol values and is compliant with medications prescribed. Participant is following exercise prescription and nutrition guidelines.;Long Term: Cholesterol controlled with medications as prescribed, with individualized exercise RX and with personalized nutrition plan. Value goals: LDL <  70mg , HDL > 40 mg.             Education:Diabetes - Individual verbal and written instruction to review signs/symptoms of diabetes, desired ranges of glucose level fasting, after meals and with exercise. Acknowledge that pre and post exercise glucose checks will be done for 3 sessions at entry of program.   Core Components/Risk Factors/Patient Goals Review:   Goals and Risk Factor Review     Row Name 03/21/23 0735 04/20/23 0756 05/17/23 1020 06/29/23 0802       Core Components/Risk Factors/Patient Goals Review   Personal Goals Review Weight Management/Obesity;Hypertension Other Weight Management/Obesity;Hypertension;Increase knowledge of respiratory medications and ability to use respiratory devices properly. Weight Management/Obesity;Hypertension    Review Sharlot Gowda is down 20 pounds since he had is heart event. He is trying to maintain his weight currently. He is checking his blood pressure at home most of the days. His pressure is doing well in the program. He has no questions on his medications. Sharlot Gowda is going to to his doctors appointment in the upcoming week for Cardioversion. He cannot tell that he is in afib and states that he may need a defribulator or pacemaker. He is doing well with his rehab and is working our routinely. Weight remains same, he is aware that he needs to not eat two portion at every meal. He will work on eating slowly and see if he feels full enough not to eat a second portion, he is not adding salt to his meals and watching sodium intake to help with blood pressure control  last BP 108/62. He is compliant with his meds and understands that eating less portion will also decrease the fat intake and help with his cholesterol control. Gregg weighed in today at 235 lb but feels he is doing well with his weight. He states that he would like to lose around 10 more lbs. We encouraged him to continue to work towards this goal through diet and exercise. He does own a BP cuff at  home but has not been checking his BP regularly. We encouraged him to check it regularly throughout the week once he graduates.    Expected Outcomes Short: continue to maintain weight. Long: maintain weight independently. Short: go to doctors appointment for Cardioversion. Long: maintain workout routine and cardiologist appointments [facility], continue to work on portion control, monitoring sodium and fat intake as well as maintaining compliance with his medications.   LTG continue  working toward healthy guidelines as learned in the program Short: Begin to check BP regularly. Long: Continue to manage lifestyle risk factors.             Core Components/Risk Factors/Patient Goals at Discharge (Final Review):   Goals and Risk Factor Review - 06/29/23 0802       Core Components/Risk Factors/Patient Goals Review   Personal Goals Review Weight Management/Obesity;Hypertension    Review Sharlot Gowda weighed in today at 235 lb but feels he is doing well with his weight. He states that he would like to lose around 10 more lbs. We encouraged him to continue to work towards this goal through diet and exercise. He does own a BP cuff at home but has not been checking his BP regularly. We encouraged him to  check it regularly throughout the week once he graduates.    Expected Outcomes Short: Begin to check BP regularly. Long: Continue to manage lifestyle risk factors.             ITP Comments:  ITP Comments     Row Name 02/07/23 0908 02/26/23 1135 02/28/23 0755 03/07/23 1208 04/04/23 1256   ITP Comments Virtual Visit completed. Patient informed on EP and RD appointment and 6 Minute walk test. Patient also informed of patient health questionnaires on My Chart. Patient Verbalizes understanding. Visit diagnosis can be found in [facility] 01/12/[facility]23. Completed and gym orientation. Initial ITP created and sent for review to Dr. Bethann Punches, Medical Director. First full day of exercise!  Patient was oriented to gym and  equipment including functions, settings, policies, and procedures.  Patient's individual exercise prescription and treatment plan were reviewed.  All starting workloads were established based on the results of the 6 minute walk test done at initial orientation visit.  The plan for exercise progression was also introduced and progression will be customized based on patient's performance and goals. 30 Day review completed. Medical Director ITP review done, changes made as directed, and signed approval by Medical Director.    new to program 30 Day review completed. Medical Director ITP review done, changes made as directed, and signed approval by Medical Director.    Row Name 04/25/23 1144 05/23/23 1005 06/20/23 1311 06/29/23 0751     ITP Comments 30 Day review completed. Medical Director ITP review done, changes made as directed, and signed approval by Medical Director. 30 Day review completed. Medical Director ITP review done, changes made as directed, and signed approval by Medical Director. 30 Day review completed. Medical Director ITP review done, changes made as directed, and signed approval by Medical Director. Yer graduated today from  rehab with 36 sessions completed.  Details of the patient's exercise prescription and what He needs to do in order to continue the prescription and progress were discussed with patient.  Patient was given a copy of prescription and goals.  Patient verbalized understanding. Wyndell plans to continue to exercise by using treadmill at home.             Comments: DISCHARGE ITP

## 2023-06-29 NOTE — Progress Notes (Signed)
Discharge Note for  Keith Torres     [birth year]        Keith Torres graduated today from  rehab with 36 sessions completed.  Details of the patient's exercise prescription and what He needs to do in order to continue the prescription and progress were discussed with patient.  Patient was given a copy of prescription and goals.  Patient verbalized understanding. Keith Torres plans to continue to exercise by using treadmill at home.    6 Minute Walk     Row Name 02/26/23 1136 05/23/23 0745       6 Minute Walk   Phase Initial Discharge    Distance 1010 feet 1600 feet    Distance % Change -- 58.4 %    Distance Feet Change -- 590 ft    Walk Time 6 minutes 6 minutes    # of Rest Breaks 0 0    MPH 1.91 3    METS 3.07 3.9    RPE 13 11    Perceived Dyspnea  2 1    VO2 Peak 10.75 13.6    Symptoms No No    Resting HR 108 bpm 78 bpm    Resting BP 94/60 112/80    Resting Oxygen Saturation  98 % 96 %    Exercise Oxygen Saturation  during 6 min walk 96 % 95 %    Max Ex. HR 133 bpm 93 bpm    Max Ex. BP 108/66 124/80    2 Minute Post BP 98/64 --

## 2023-09-20 ENCOUNTER — Other Ambulatory Visit: Payer: Self-pay

## 2023-09-20 DIAGNOSIS — R972 Elevated prostate specific antigen [PSA]: Secondary | ICD-10-CM

## 2023-09-28 ENCOUNTER — Other Ambulatory Visit: Payer: Self-pay

## 2023-09-28 DIAGNOSIS — R972 Elevated prostate specific antigen [PSA]: Secondary | ICD-10-CM

## 2023-09-29 LAB — PSA: Prostate Specific Ag, Serum: 2 ng/mL (ref 0.0–4.0)

## 2023-10-03 ENCOUNTER — Encounter: Payer: Self-pay | Admitting: Urology

## 2023-10-03 ENCOUNTER — Ambulatory Visit: Payer: Self-pay | Admitting: Urology

## 2023-10-03 VITALS — BP 111/70 | HR 71 | Ht 74.0 in | Wt 230.0 lb

## 2023-10-03 DIAGNOSIS — Z87898 Personal history of other specified conditions: Secondary | ICD-10-CM | POA: Diagnosis not present

## 2023-10-03 NOTE — Progress Notes (Signed)
 I, Keith Torres, acting as a scribe for Geraline Knapp, MD., have documented all relevant documentation on the behalf of Geraline Knapp, MD, as directed by Geraline Knapp, MD while in the presence of Geraline Knapp, MD.  10/03/2023 3:31 PM   Keith Torres. [birth year] [id number]  Referring provider: Baltazar Leventhal, MD [address] [place],  [place] [address]  Chief Complaint  Patient presents with   Elevated PSA   Urologic history: 1. Elevated PSA -PSA August 2024 elevated 5.06; repeated and was 4.72 with a PHI of 36.8 (33.3% probability of prostate cancer".  -Prostate MRI remarkable for a 56.4 cc volume, in a PI-RADS III lesion left posterolateral PZ. He elected surveillance.  HPI: Keith Torres. is a 58 y.o. male presents for a 6 month follow-up.   No complaints since his last visit.  Follow-up PSA 09/28/23 back to baseline at 2.0    PMH: Past Medical History:  Diagnosis Date   Anxiety    Atrial fibrillation (HCC)    Dental crowns present    FRONT TWO TEETH   Deviated septum    Dysrhythmia    A-FIB/ DR Bary Likes   Hyperlipidemia    Hypertrophy of nasal turbinates    Meningitis    Obesity    Rhinitis, allergic    Seasonal allergies    Sleep apnea    C-PAP    Surgical History: Past Surgical History:  Procedure Laterality Date   APPENDECTOMY  06/05/1972   BACK SURGERY  06/05/2013   CARDIOVERSION N/A 03/15/2023   Procedure: CARDIOVERSION;  Surgeon: Antonette Batters, MD;  Location: [facility] ORS;  Service: Cardiovascular;  Laterality: N/A;   CARDIOVERSION N/A 05/04/2023   Procedure: CARDIOVERSION;  Surgeon: Antonette Batters, MD;  Location: [facility] ORS;  Service: Cardiovascular;  Laterality: N/A;   COLONOSCOPY     COLONOSCOPY WITH PROPOFOL  N/A 10/08/2015   Procedure: COLONOSCOPY WITH PROPOFOL ;  Surgeon: Cassie Click, MD;  Location: [facility] ENDOSCOPY;  Service: Endoscopy;  Laterality: N/A;   CORONARY ANGIOPLASTY WITH STENT PLACEMENT      CORONARY/GRAFT ACUTE MI REVASCULARIZATION N/A 01/12/2023   Procedure: Coronary/Graft Acute MI Revascularization;  Surgeon: Sammy Crisp, MD;  Location: [facility] INVASIVE CV LAB;  Service: Cardiovascular;  Laterality: N/A;   HERNIA REPAIR Right 01/17/2001   Indirect hernia, Prolene hernia system.   HERNIA REPAIR Left 03/23/2014   Left indirect inguinal hernia repair, large ultra Pro mesh   LEFT HEART CATH AND CORONARY ANGIOGRAPHY N/A 01/12/2023   Procedure: LEFT HEART CATH AND CORONARY ANGIOGRAPHY;  Surgeon: Sammy Crisp, MD;  Location: [facility] INVASIVE CV LAB;  Service: Cardiovascular;  Laterality: N/A;   NASAL TURBINATE REDUCTION Bilateral 05/21/2015   Procedure: TURBINATE REDUCTION/SUBMUCOSAL RESECTION;  Surgeon: Lesly Raspberry, MD;  Location: [facility];  Service: ENT;  Laterality: Bilateral;   SEPTOPLASTY N/A 05/21/2015   Procedure: SEPTOPLASTY;  Surgeon: Lesly Raspberry, MD;  Location: [facility];  Service: ENT;  Laterality: N/A;  C-PAP    Home Medications:  Allergies as of 10/03/2023       Reactions   Pravastatin Other (See Comments)   Azithromycin  Other (See Comments)   Drug interacts with current daily medication.        Medication List        Accurate as of October 03, 2023  3:31 PM. If you have any questions, ask your nurse or doctor.          STOP taking these medications  amiodarone  200 MG tablet Commonly known as: PACERONE  Stopped by: Geraline Knapp   furosemide  40 MG tablet Commonly known as: LASIX  Stopped by: Yavonne Kiss C Gracianna Vink   metoprolol  tartrate 100 MG tablet Commonly known as: LOPRESSOR  Stopped by: Geraline Knapp   sacubitril-valsartan 24-26 MG Commonly known as: ENTRESTO Stopped by: Geraline Knapp       TAKE these medications    atorvastatin  20 MG tablet Commonly known as: LIPITOR Take 10 mg by mouth every other day.   clopidogrel  75 MG tablet Commonly known as: PLAVIX  Take 75 mg by mouth in the morning.   Eliquis  5  MG Tabs tablet Generic drug: apixaban  Take 5 mg by mouth 2 (two) times daily.   Farxiga 10 MG Tabs tablet Generic drug: dapagliflozin propanediol Take 10 mg by mouth in the morning.   losartan 25 MG tablet Commonly known as: COZAAR Take 25 mg by mouth daily.   metoprolol  succinate 100 MG 24 hr tablet Commonly known as: TOPROL -XL Take 1 tablet by mouth at bedtime.        Allergies:  Allergies  Allergen Reactions   Pravastatin Other (See Comments)   Azithromycin  Other (See Comments)    Drug interacts with current daily medication.    Family History: Family History  Problem Relation Age of Onset   Cancer Mother        colon   Heart disease Father    Hyperlipidemia Father    CAD Father     Social History:  reports that he has never smoked. He quit smokeless tobacco use about 26 years ago.  His smokeless tobacco use included chew. He reports that he does not currently use alcohol. He reports that he does not use drugs.   Physical Exam: BP 111/70   Pulse 71   Ht 6\' 2"  (1.88 m)   Wt 230 lb (104.3 kg)   BMI 29.53 kg/m   Constitutional:  Alert and oriented, No acute distress. Psychiatric: Normal mood and affect.   Assessment & Plan:    1. History of elevated PSA Previous elevation most likely secondary to transient inflammation.  Continue annual prostate cancer screening with PCP; follow up PRN.  I have reviewed the above documentation for accuracy and completeness, and I agree with the above.   Geraline Knapp, MD  [facility] [address], [address] [place], [place] [address] [phone]

## 2023-10-08 ENCOUNTER — Emergency Department
Admission: EM | Admit: 2023-10-08 | Discharge: 2023-10-09 | Disposition: A | Discharge status: Home / Self Care | Attending: Emergency Medicine | Admitting: Emergency Medicine

## 2023-10-08 ENCOUNTER — Emergency Department

## 2023-10-08 ENCOUNTER — Other Ambulatory Visit: Payer: Self-pay

## 2023-10-08 ENCOUNTER — Encounter: Payer: Self-pay | Admitting: Emergency Medicine

## 2023-10-08 DIAGNOSIS — R079 Chest pain, unspecified: Secondary | ICD-10-CM | POA: Diagnosis present

## 2023-10-08 DIAGNOSIS — I2510 Atherosclerotic heart disease of native coronary artery without angina pectoris: Secondary | ICD-10-CM | POA: Insufficient documentation

## 2023-10-08 DIAGNOSIS — Z7901 Long term (current) use of anticoagulants: Secondary | ICD-10-CM | POA: Diagnosis not present

## 2023-10-08 DIAGNOSIS — I4891 Unspecified atrial fibrillation: Secondary | ICD-10-CM | POA: Diagnosis not present

## 2023-10-08 LAB — CBC
HCT: 43.9 % (ref 39.0–52.0)
Hemoglobin: 15.5 g/dL (ref 13.0–17.0)
MCH: 31.8 pg (ref 26.0–34.0)
MCHC: 35.3 g/dL (ref 30.0–36.0)
MCV: 90 fL (ref 80.0–100.0)
Platelets: 147 10*3/uL — ABNORMAL LOW (ref 150–400)
RBC: 4.88 MIL/uL (ref 4.22–5.81)
RDW: 12.3 % (ref 11.5–15.5)
WBC: 5.4 10*3/uL (ref 4.0–10.5)
nRBC: 0 % (ref 0.0–0.2)

## 2023-10-08 LAB — BASIC METABOLIC PANEL WITH GFR
Anion gap: 10 (ref 5–15)
BUN: 21 mg/dL — ABNORMAL HIGH (ref 6–20)
CO2: 21 mmol/L — ABNORMAL LOW (ref 22–32)
Calcium: 9.2 mg/dL (ref 8.9–10.3)
Chloride: 107 mmol/L (ref 98–111)
Creatinine, Ser: 1.31 mg/dL — ABNORMAL HIGH (ref 0.61–1.24)
GFR, Estimated: >60 mL/min (ref >60)
Glucose, Bld: 133 mg/dL — ABNORMAL HIGH (ref 70–99)
Potassium: 3.8 mmol/L (ref 3.5–5.1)
Sodium: 138 mmol/L (ref 135–145)

## 2023-10-08 LAB — TROPONIN I (HIGH SENSITIVITY): Troponin I (High Sensitivity): 9 ng/L (ref <18)

## 2023-10-08 NOTE — ED Triage Notes (Signed)
 Pt presents to the ED via POV with complaints of Midsternal CP that started tonight after some indigestion. Hx of MI in August 2024 with stent placement. Pt states the pain has subsided some after a few episodes of belching. A&Ox4 at this time. Denies fevers or SOB.

## 2023-10-09 LAB — TROPONIN I (HIGH SENSITIVITY)
Troponin I (High Sensitivity): 17 ng/L (ref <18)
Troponin I (High Sensitivity): 18 ng/L — ABNORMAL HIGH (ref <18)

## 2023-10-09 NOTE — ED Notes (Signed)
 Urine sent to lab

## 2023-10-09 NOTE — ED Provider Notes (Signed)
 [facility] Provider Note    Event Date/Time   First MD Initiated Contact with Patient 10/08/23 2351     (approximate)   History   Chief Complaint Chest Pain   HPI  Keith Avant. is a 58 y.o. male with past medical history of hyperlipidemia, CAD, and atrial fibrillation on Eliquis  who presents to the ED complaining of chest pain.  Patient reports that he developed tightness and discomfort in his chest yesterday evening around 8 PM while sitting on the couch.  He denies any associated fevers, cough, or shortness of breath, now states that chest pain has resolved.  He has not noticed any pain or swelling in his legs, does report symptoms were similar to his MI this past summer.  He denies any cardiac issues since then, has been compliant with medications.  He has not had any other episodes of chest discomfort, denies any dyspnea on exertion recently.     Physical Exam   Triage Vital Signs: ED Triage Vitals [10/08/23 2140]  Encounter Vitals Group     BP 122/77     Systolic BP Percentile      Diastolic BP Percentile      Pulse Rate 68     Resp 18     Temp 98.2 F (36.8 C)     Temp Source Oral     SpO2 100 %     Weight 233 lb (105.7 kg)     Height 6\' 2"  (1.88 m)     Head Circumference      Peak Flow      Pain Score 3     Pain Loc      Pain Education      Exclude from Growth Chart     Most recent vital signs: Vitals:   10/08/23 2140 10/09/23 0233  BP: 122/77 98/72  Pulse: 68 67  Resp: 18 18  Temp: 98.2 F (36.8 C) 98 F (36.7 C)  SpO2: 100% 97%    Constitutional: Alert and oriented. Eyes: Conjunctivae are normal. Head: Atraumatic. Nose: No congestion/rhinnorhea. Mouth/Throat: Mucous membranes are moist.  Cardiovascular: Normal rate, regular rhythm. Grossly normal heart sounds.  2+ radial pulses bilaterally. Respiratory: Normal respiratory effort.  No retractions. Lungs CTAB.  No chest wall tenderness to  palpation. Gastrointestinal: Soft and nontender. No distention. Musculoskeletal: No lower extremity tenderness nor edema.  Neurologic:  Normal speech and language. No gross focal neurologic deficits are appreciated.    ED Results / Procedures / Treatments   Labs (all labs ordered are listed, but only abnormal results are displayed) Labs Reviewed  BASIC METABOLIC PANEL WITH GFR - Abnormal; Notable for the following components:      Result Value   CO2 21 (*)    Glucose, Bld 133 (*)    BUN 21 (*)    Creatinine, Ser 1.31 (*)    All other components within normal limits  CBC - Abnormal; Notable for the following components:   Platelets 147 (*)    All other components within normal limits  TROPONIN I (HIGH SENSITIVITY) - Abnormal; Notable for the following components:   Troponin I (High Sensitivity) 18 (*)    All other components within normal limits  TROPONIN I (HIGH SENSITIVITY)  TROPONIN I (HIGH SENSITIVITY)     EKG  ED ECG REPORT I, Twilla Galea, the attending physician, personally viewed and interpreted this ECG.   Date: 10/09/2023  EKG Time: 21:39  Rate: 69  Rhythm: normal sinus  rhythm  Axis: Normal  Intervals:first-degree A-V block   ST&T Change: Inferior Q waves and T wave inversions  RADIOLOGY Chest x-ray reviewed and interpreted by me with no infiltrate, edema, or effusion.  PROCEDURES:  Critical Care performed: No  Procedures   MEDICATIONS ORDERED IN ED: Medications - No data to display   IMPRESSION / MDM / ASSESSMENT AND PLAN / ED COURSE  I reviewed the triage vital signs and the nursing notes.                              58 y.o. male with past medical history of hyperlipidemia, CAD, and atrial fibrillation on Eliquis  who presents to the ED complaining of chest tightness and discomfort while sitting on the couch this evening.  Patient's presentation is most consistent with acute presentation with potential threat to life or bodily  function.  Differential diagnosis includes, but is not limited to, ACS, PE, pneumonia, pneumothorax, musculoskeletal pain, GERD, anxiety.  Patient nontoxic-appearing and in no acute distress, vital signs are unremarkable.  EKG shows inferior Q waves and T wave inversions, similar to previous.  Initial troponin within normal limits, we will check second set troponin, but patient reports that chest pain has since resolved.  Chest x-ray is unremarkable and remainder of labs without significant anemia, leukocytosis, electrolyte abnormality, or AKI.  Repeat troponin with delta of 8 compared to the initial, patient remains chest pain-free on reassessment.  Third set of troponin was sent and has a delta of 1 compared to the second set.  Findings reviewed with Dr. Bob Burn of cardiology, who recommends outpatient management with close follow-up and return precautions.  Patient assures me he will return to the ED for any recurrent episodes or other symptoms.  Patient agrees with plan.      FINAL CLINICAL IMPRESSION(S) / ED DIAGNOSES   Final diagnoses:  Nonspecific chest pain     Rx / DC Orders   ED Discharge Orders     None        Note:  This document was prepared using Dragon voice recognition software and may include unintentional dictation errors.   Twilla Galea, MD 10/09/23 [phone]
